# Patient Record
Sex: Female | Born: 1940 | Race: White | Hispanic: No | Smoking: Never smoker
Health system: Southern US, Community
[De-identification: ages and names within clinical notes are randomized; demographics above are authoritative.]

## PROBLEM LIST (undated history)

## (undated) DIAGNOSIS — N183 Chronic kidney disease, stage 3 unspecified: Secondary | ICD-10-CM

## (undated) DIAGNOSIS — R319 Hematuria, unspecified: Secondary | ICD-10-CM

## (undated) DIAGNOSIS — D649 Anemia, unspecified: Secondary | ICD-10-CM

## (undated) DIAGNOSIS — E7800 Pure hypercholesterolemia, unspecified: Secondary | ICD-10-CM

## (undated) DIAGNOSIS — N200 Calculus of kidney: Secondary | ICD-10-CM

## (undated) DIAGNOSIS — I10 Essential (primary) hypertension: Secondary | ICD-10-CM

## (undated) DIAGNOSIS — I4891 Unspecified atrial fibrillation: Secondary | ICD-10-CM

## (undated) DIAGNOSIS — F419 Anxiety disorder, unspecified: Secondary | ICD-10-CM

## (undated) DIAGNOSIS — E039 Hypothyroidism, unspecified: Secondary | ICD-10-CM

## (undated) DIAGNOSIS — Z9289 Personal history of other medical treatment: Secondary | ICD-10-CM

## (undated) HISTORY — PX: FINGER SURGERY: SHX640

## (undated) HISTORY — DX: Calculus of kidney: N20.0

## (undated) HISTORY — PX: CYSTOSCOPY W/ STONE MANIPULATION: SHX1427

## (undated) HISTORY — PX: LAPAROSCOPIC CHOLECYSTECTOMY: SUR755

## (undated) HISTORY — PX: ABDOMINAL HYSTERECTOMY: SHX81

## (undated) HISTORY — PX: APPENDECTOMY: SHX54

## (undated) HISTORY — DX: Essential (primary) hypertension: I10

## (undated) HISTORY — PX: FRACTURE SURGERY: SHX138

## (undated) HISTORY — PX: TONSILLECTOMY: SUR1361

## (undated) HISTORY — PX: ANKLE FRACTURE SURGERY: SHX122

---

## 2010-09-06 HISTORY — PX: CATARACT EXTRACTION W/ INTRAOCULAR LENS  IMPLANT, BILATERAL: SHX1307

## 2010-09-16 ENCOUNTER — Encounter: Payer: Self-pay | Admitting: Family Medicine

## 2010-09-16 ENCOUNTER — Ambulatory Visit (INDEPENDENT_AMBULATORY_CARE_PROVIDER_SITE_OTHER): Payer: Medicare Other | Admitting: Family Medicine

## 2010-09-16 DIAGNOSIS — IMO0001 Reserved for inherently not codable concepts without codable children: Secondary | ICD-10-CM

## 2010-09-16 DIAGNOSIS — R030 Elevated blood-pressure reading, without diagnosis of hypertension: Secondary | ICD-10-CM

## 2010-09-16 DIAGNOSIS — L659 Nonscarring hair loss, unspecified: Secondary | ICD-10-CM

## 2010-09-16 NOTE — Progress Notes (Signed)
  Subjective:    Patient ID: Rachel Keith, female    DOB: [birth year], 70 y.o.   MRN: [id number]  HPI Losing hair on the right side of her head. When she was 39 years she had a complete hysterectomy for heavy bleeding. Has seen dermatology several time over her lifetime for hair loss. She lost her hair with each pregnancy. She came off her estrogen in 2003.   At that time she was treated with monthly 20 mg estrogen shots. A little over a year ago she started to notice that her hair was thinning again. She went to her PCP and asked to go back on her HRT.  No family hx of breast cancer. She gets her mammogram yearly. She restarted her estrogen(that it was the pill form) and synthroid about a year ago.  2 weeks ago noticed a strip of thinning hair.   Then went to endocrinology and they didn't help.  She overall feels there's a big difference between estrogen injections and the pill form. She feels like the hair loss is most likely because she's not on the injections. Her last thyroid check was approximately 4 weeks ago and it was normal. Her dose was not adjusted.  Both parents had thinning of the hair.   Review of Systems  Constitutional: Negative for fever, diaphoresis and unexpected weight change.  HENT: Negative for hearing loss, rhinorrhea, sneezing, postnasal drip and tinnitus.   Eyes: Negative for visual disturbance.  Respiratory: Negative for cough and wheezing.   Cardiovascular: Negative for chest pain and palpitations.  Gastrointestinal: Negative for nausea, vomiting, diarrhea and blood in stool.  Genitourinary: Negative for vaginal bleeding, vaginal discharge and difficulty urinating.  Musculoskeletal: Negative for myalgias and arthralgias.  Skin: Negative for rash.  Neurological: Negative for headaches.  Hematological: Negative for adenopathy. Does not bruise/bleed easily.  Psychiatric/Behavioral: Negative for sleep disturbance and dysphoric mood. The patient is not nervous/anxious.        Objective:   Physical Exam  Constitutional: She appears well-developed and well-nourished.  HENT:  Head: Normocephalic and atraumatic.  Eyes: Conjunctivae and EOM are normal. Pupils are equal, round, and reactive to light.  Neck: Neck supple. No thyromegaly present.  Cardiovascular: Normal rate and regular rhythm.   Murmur heard. Pulmonary/Chest: Effort normal and breath sounds normal.  Lymphadenopathy:    She has no cervical adenopathy.  Skin:       On the right side of her scalp she does have a wide strip of hair loss. 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 She does have slightly thinning hair all over but her hair is also very ever processed.          Assessment & Plan:  PA her thyroid ultrasound and mammogram from [facility] imaging as well as her records from her last PCP.

## 2010-09-17 ENCOUNTER — Encounter: Payer: Self-pay | Admitting: Family Medicine

## 2010-09-17 DIAGNOSIS — L659 Nonscarring hair loss, unspecified: Secondary | ICD-10-CM | POA: Insufficient documentation

## 2010-09-17 DIAGNOSIS — I10 Essential (primary) hypertension: Secondary | ICD-10-CM | POA: Insufficient documentation

## 2010-09-17 NOTE — Assessment & Plan Note (Signed)
Her blood pressure is definitely elevated today. She is now taking blood pressure medications. I would like to recheck this in one month to confirm. Also like to get her old records to see if she's had 10 of hypotension in the past.

## 2010-09-17 NOTE — Assessment & Plan Note (Signed)
Unclear etiology at this point. With a strange about this particular hair loss is noted in one section on the right side of her head. It is very asymmetrical. 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 We discussed different options including seeing a hair specialist, Dr. Linton Rump [facility]. There is approximately a nine-month wait list to get in with her. Another option is to consider changing hormones. We could consider biochemicals and she is pretty set on continuing her hormone therapy. We did discuss the potential risks. I did encourage her to at least get her yearly mammogram which she has already been doing. I also discussed the option of taking a hair loss medication such as Propecia which is primarily indicated for men but patient was not interested in this at this time. She might also benefit from possible trial of Kenalog injections into the area of hair loss, but I will leave this to Dr. Christin Bach expertise.

## 2010-09-18 ENCOUNTER — Telehealth: Payer: Self-pay | Admitting: Family Medicine

## 2010-09-18 NOTE — Telephone Encounter (Signed)
Referral entered  

## 2010-09-18 NOTE — Telephone Encounter (Signed)
Call patient: Just received the notes from the endocrinologist today. I reviewed this. Overall he felt like based on your recent thyroid ultrasound that she has Hashimoto's disease. In regards to the hair loss he felt like that abnormal thyroid hormone levels can cause hair loss and once the thyroid level is back to normal it can take up to 6 months for normal hair growth to recover. Though I know she has been on the thyroid hormone for almost a year. He did not recommend changing her dose as her thyroid hormone levels were normal. He felt any worsening of hair loss was not related to her thyroid gland. At this point in time I still think the best idea is to get a hair specialist involved and proceed after a [facility]. He also does not recommend repeating the ultrasound in the time soon because he felt that the ultrasound is consistent with the Hashimoto's disease and does not require future surveillance.

## 2010-09-18 NOTE — Telephone Encounter (Signed)
Pt notified and voiced understanding.Pt would like to see Dr. Randye Lobo

## 2010-09-25 ENCOUNTER — Telehealth: Payer: Self-pay | Admitting: Family Medicine

## 2010-09-25 NOTE — Telephone Encounter (Signed)
Pt called and has a few questions.   Plan:  Routed the call to triage nurse and pending call back from pt.  Had to [facility]. Jarvis Newcomer, LPN [facility]

## 2010-10-10 ENCOUNTER — Encounter: Payer: Self-pay | Admitting: Family Medicine

## 2010-10-10 ENCOUNTER — Ambulatory Visit (INDEPENDENT_AMBULATORY_CARE_PROVIDER_SITE_OTHER): Payer: Medicare Other | Admitting: Family Medicine

## 2010-10-10 DIAGNOSIS — Z780 Asymptomatic menopausal state: Secondary | ICD-10-CM

## 2010-10-10 DIAGNOSIS — G43109 Migraine with aura, not intractable, without status migrainosus: Secondary | ICD-10-CM | POA: Insufficient documentation

## 2010-10-10 DIAGNOSIS — E785 Hyperlipidemia, unspecified: Secondary | ICD-10-CM | POA: Insufficient documentation

## 2010-10-10 DIAGNOSIS — I10 Essential (primary) hypertension: Secondary | ICD-10-CM

## 2010-10-10 MED ORDER — ATENOLOL 100 MG PO TABS: 100.0000 mg | ORAL_TABLET | Freq: Two times a day (BID) | ORAL | Status: DC

## 2010-10-10 NOTE — Telephone Encounter (Signed)
No response from pt as of 10-10-10, therefore, closed this encounter. Jarvis Newcomer, LPN [facility]

## 2010-10-10 NOTE — Progress Notes (Signed)
  Subjective:    Patient ID: Rachel Keith, female    DOB: May 27, 1940, 70 y.o.   MRN: [id number]  HPI  She is no longer taking her Crestor. No side effects per say but didn't want to take it. I don't have a copy of her labs so i don't know what her numbers look like.  She is complaint with her BP meds and says she did take them this AM. NO SE.    She want to discuss her hormone therapy. She really feels the oral estradiol is not enough and is not working well for her.  She used to use injection 40mg  and says she really felt great on this. She is unhappy with the aging process and wants to age gracefully. She is also currently using REtin-A cream for wrinkles. She doesn't need refills right now.  She also feels that the estrogen is now enough because in the past when the "dose was higher" it really helped with her hair loss.  We are working on an appt with Dr. Darreld Mclean who is a hairloss expert in our area.    Review of Systems     Objective:   Physical Exam  Constitutional: She is oriented to person, place, and time. She appears well-developed and well-nourished.  HENT:  Head: Normocephalic and atraumatic.  Cardiovascular: Normal rate, regular rhythm and normal heart sounds.        No carotid bruits.   Pulmonary/Chest: Effort normal and breath sounds normal.  Neurological: She is alert and oriented to person, place, and time.  Psychiatric: She has a normal mood and affect.          Assessment & Plan:

## 2010-10-10 NOTE — Assessment & Plan Note (Signed)
Discussed that since the is low risk and she is very persistant about wanting hormones I recommend compounded hormones that will have a more consistant deliver. I explain how these work and I recommend saliva testing for hormone levels. Will refer her to [facility] to med with Dr. Keitha Butte. For now can contniue her estradiol.

## 2010-10-10 NOTE — Assessment & Plan Note (Addendum)
Not well controlled. Inc atenolol to bid since the half life on this drug is actually pretty short.  F/u for repeat BP check in 1 month. Hopefully i will have her labs from prior PCP by then.

## 2010-10-10 NOTE — Patient Instructions (Signed)
[facility].  Call [phone]. [address], [address], [place], [place] [address]

## 2010-11-01 ENCOUNTER — Telehealth: Payer: Self-pay | Admitting: Family Medicine

## 2010-11-01 ENCOUNTER — Other Ambulatory Visit: Payer: Self-pay | Admitting: Family Medicine

## 2010-11-01 MED ORDER — AMBULATORY NON FORMULARY MEDICATION: Status: DC

## 2010-11-01 NOTE — Telephone Encounter (Signed)
Rachel Keith called from [facility] and the pt is wanting someone to call her to discuss her labs.  Dr. Linford Arnold ordered for the saliva test to be done. Plan:  Notified Rachel Keith at the pharmacy.  Told to be sure to go ahead and refax the labs again today and put attention to the provider.  Pt has appt coming up and the compound pharm will tell the pt we'll decide on hormones after provider reviews. Rachel Newcomer, LPN [facility]

## 2010-11-18 NOTE — Telephone Encounter (Signed)
Called to the patient. Check she has an appointment on Thursday morning additionally, discussed her results. I do have a copy of the results in my box.

## 2010-11-21 ENCOUNTER — Other Ambulatory Visit: Payer: Self-pay | Admitting: Family Medicine

## 2010-11-21 ENCOUNTER — Ambulatory Visit (INDEPENDENT_AMBULATORY_CARE_PROVIDER_SITE_OTHER): Payer: Medicare Other | Admitting: Family Medicine

## 2010-11-21 ENCOUNTER — Encounter: Payer: Self-pay | Admitting: Family Medicine

## 2010-11-21 VITALS — BP 157/75 | HR 50 | Ht 62.0 in | Wt 130.0 lb

## 2010-11-21 DIAGNOSIS — L659 Nonscarring hair loss, unspecified: Secondary | ICD-10-CM

## 2010-11-21 DIAGNOSIS — F411 Generalized anxiety disorder: Secondary | ICD-10-CM

## 2010-11-21 DIAGNOSIS — I10 Essential (primary) hypertension: Secondary | ICD-10-CM

## 2010-11-21 DIAGNOSIS — F419 Anxiety disorder, unspecified: Secondary | ICD-10-CM

## 2010-11-21 MED ORDER — FLUOXETINE HCL 20 MG PO TABS: ORAL_TABLET | ORAL | Status: DC

## 2010-11-21 NOTE — Progress Notes (Signed)
  Subjective:    Patient ID: Rachel Keith, female    DOB: [birth year], 70 y.o.   MRN: [id number]  HPI She has started the estrogen and progesterone cream and has noticed dec hair loss on her scalp.She has noticed some inc hair growth on her upper lip with her progerstone cream.  She is told that this will mean that she'll get some increased hair growth on her head head. She says she really liked working with [facility]  She is under a lot of stress right now, finances, ex-husband, etc.  her son is currently living with her and he seems to be somewhat angry before and has been difficult to live with. He also owes her a lot of money. She really wants an anxiety medication so that she isnt' using valium too often.  Feels he nerves are shot.   BP is till high.  She did increase her atenolol ot bid and and is toelrating well but says she is just really stressed right now and says that his why her BP is high.  She's been taking her medications regularly.   Review of Systems     Objective:   Physical Exam  Constitutional: She is oriented to person, place, and time. She appears well-developed and well-nourished.  Neurological: She is alert and oriented to person, place, and time.  Skin: Skin is warm and dry.  Psychiatric: She has a normal mood and affect. Her behavior is normal.          Assessment & Plan:

## 2010-11-21 NOTE — Assessment & Plan Note (Signed)
We discussed starting an SSRI. We discussed the potential side effects of the medication and what to expect. I alsoexplained that this is slow low acting. Recommend she followup in 3 weeks. She can still use this with her Valium. Her dad 7 score was 17 today.

## 2010-11-21 NOTE — Assessment & Plan Note (Signed)
Her blood pressure is not well controlled today. Is having increasing her atenolol would make a difference. She can follow back up in 3 weeks for her mood and will recheck at that time. If she is still running high we will adjust her medication again. She really seems to think that this is stress related.

## 2010-11-21 NOTE — Assessment & Plan Note (Signed)
Will start an SSRI and f/u in 3 weeks. Discussed potential SE.

## 2010-11-21 NOTE — Telephone Encounter (Signed)
Pt called and said she was seen this morning, and was suppose to have a script faxed to her pharm and when she went there to get it they didn't have it.  The script was for her prozac, but when looking in the pt chart file it was sent today. Plan:  Called the pt and she said she had gotten the script.\ Jarvis Newcomer, LPN [facility]

## 2010-11-21 NOTE — Assessment & Plan Note (Signed)
Hopefully getting her hormones were balanced will improve her hair loss. We did review the results of her hormone testing in the office today.

## 2010-12-16 ENCOUNTER — Encounter: Payer: Self-pay | Admitting: Family Medicine

## 2010-12-16 ENCOUNTER — Ambulatory Visit (INDEPENDENT_AMBULATORY_CARE_PROVIDER_SITE_OTHER): Payer: Medicare Other | Admitting: Family Medicine

## 2010-12-16 DIAGNOSIS — F419 Anxiety disorder, unspecified: Secondary | ICD-10-CM

## 2010-12-16 DIAGNOSIS — I10 Essential (primary) hypertension: Secondary | ICD-10-CM

## 2010-12-16 DIAGNOSIS — F411 Generalized anxiety disorder: Secondary | ICD-10-CM

## 2010-12-16 MED ORDER — DIAZEPAM 10 MG PO TABS: 10.0000 mg | ORAL_TABLET | Freq: Two times a day (BID) | ORAL | Status: DC | PRN

## 2010-12-16 MED ORDER — CITALOPRAM HYDROBROMIDE 10 MG PO TABS: 10.0000 mg | ORAL_TABLET | Freq: Every day | ORAL | Status: DC

## 2010-12-16 NOTE — Progress Notes (Signed)
  Subjective:    Patient ID: Rachel Keith, female    DOB: [birth year], 70 y.o.   MRN: [id number]  HPI  She is here to followup today for her anxiety. Overall she is still extremely stressed. Her daughter who she has not seen in 3 years is coming into town. She did try the Paxil for several weeks and that it made her extremely tired. She felt very groggy and weak on it. She also felt it did not help her mood. She's been using her Valium when necessary. She said she would like to go back and try the generic again as the brand name is constant her $200. She has tried the generic in the past and felt it did not work as well as the brand. She was wondering if we could try a higher dose generic and see if maybe that works as well as the brand 5 mg dose.  Hypertension-she says she is still taking her medications regularly but she is extremely stressed. She really feels that this is contributing to her high blood pressure.  Review of Systems     Objective:   Physical Exam  Constitutional: She is oriented to person, place, and time. She appears well-developed and well-nourished.  HENT:  Head: Normocephalic and atraumatic.  Cardiovascular: Normal rate, regular rhythm and normal heart sounds.   Pulmonary/Chest: Effort normal and breath sounds normal.  Neurological: She is alert and oriented to person, place, and time.  Skin: Skin is warm and dry.  Psychiatric: She has a normal mood and affect. Her behavior is normal.          Assessment & Plan:

## 2010-12-16 NOTE — Assessment & Plan Note (Addendum)
Her blood pressure is better today than it was at the last visit. She has been taking her medications consistently. However the like and that is HCTZ. Recheck in one month.

## 2010-12-16 NOTE — Assessment & Plan Note (Signed)
Her GAD 7 score is 20 today. Which is still pretty significant. At this point time we will discontinue her Prozac, that she is married he stopped it herself. We will try citalopram and see her back in one month. Her pH q. 9 score is 16 today. I did increase her Valium to 10 mg and wrote for generic. This should only be used when necessary.

## 2010-12-18 ENCOUNTER — Telehealth: Payer: Self-pay | Admitting: *Deleted

## 2010-12-18 NOTE — Telephone Encounter (Signed)
Pt does not want to start a fluid pill

## 2010-12-18 NOTE — Telephone Encounter (Signed)
Message copied by Wyline Beady on Wed Dec 18, 2010 12:22 PM ------      Message from: Nani Gasser D      Created: Mon Dec 16, 2010  5:15 PM       Would you call patient and see if she is okay with starting a low dose fluid pill in addition to her other blood pressure medications as her blood pressure was still high at the office visit yesterday.

## 2011-01-06 ENCOUNTER — Ambulatory Visit (INDEPENDENT_AMBULATORY_CARE_PROVIDER_SITE_OTHER): Payer: Medicare Other | Admitting: Family Medicine

## 2011-01-06 ENCOUNTER — Encounter: Payer: Self-pay | Admitting: Family Medicine

## 2011-01-06 VITALS — BP 151/68 | HR 46 | Wt 130.0 lb

## 2011-01-06 DIAGNOSIS — F411 Generalized anxiety disorder: Secondary | ICD-10-CM

## 2011-01-06 DIAGNOSIS — F419 Anxiety disorder, unspecified: Secondary | ICD-10-CM

## 2011-01-06 DIAGNOSIS — I498 Other specified cardiac arrhythmias: Secondary | ICD-10-CM

## 2011-01-06 DIAGNOSIS — I10 Essential (primary) hypertension: Secondary | ICD-10-CM

## 2011-01-06 DIAGNOSIS — R001 Bradycardia, unspecified: Secondary | ICD-10-CM

## 2011-01-06 MED ORDER — BENAZEPRIL HCL 40 MG PO TABS: 40.0000 mg | ORAL_TABLET | Freq: Every day | ORAL | Status: DC

## 2011-01-06 NOTE — Progress Notes (Signed)
  Subjective:    Patient ID: Rachel Keith, female    DOB: 01/19/1941, 70 y.o.   MRN: [id number]  HPI Says she really wants to add capsules to her Biest cream. I will talk to [facility].She has noticed improvement in her skin and some in her haris  She says she is dong well on the generic valium. She was worried about the generic but it is working well.   Low HR - Feeling more tired easily.   No SOB or chest pain.  Able to do her yard work and house work without any shortness of breath, dizziness. She denies any palpitations. No prior history of heart disease. Her [place] stress [place] and she reports it was normal.   Review of Systems     Objective:   Physical Exam  Constitutional: She is oriented to person, place, and time. She appears well-developed and well-nourished.  HENT:  Head: Normocephalic and atraumatic.  Cardiovascular: Regular rhythm and normal heart sounds.        Bradycardic.   Pulmonary/Chest: Effort normal and breath sounds normal.  Musculoskeletal: She exhibits no edema.  Neurological: She is alert and oriented to person, place, and time.  Skin: Skin is warm and dry.  Psychiatric: She has a normal mood and affect. Her behavior is normal.          Assessment & Plan:  Bradycardia-I would like her to cardiology for further evaluation. Her EKG today showed a rate of 43 beats per minute normal sinus rhythm. She did have inverted T waves in lead 3. This may be benign but I would like to have cardiology evaluate her.  Hypertension-still not well controlled. She admitted she had not been taking the Lotensin and only the atenolol. I let her know that we need to restart the Lotensin and she needs to take both medications. Followup in 6 weeks to recheck blood pressure.  Anxiety-wise she is doing on the generic Valium.  Hair loss/menopausal symptoms-I will comment solutions and talk to them about possibly doing the capsules.

## 2011-01-06 NOTE — Patient Instructions (Signed)
We will call you with the cardiology appointment.

## 2011-01-07 ENCOUNTER — Encounter: Payer: Self-pay | Admitting: Family Medicine

## 2011-01-08 ENCOUNTER — Telehealth: Payer: Self-pay | Admitting: Family Medicine

## 2011-01-08 NOTE — Telephone Encounter (Signed)
Pt calling and inquiring about a medication that she had been using in cream form called biest cream, but said she had discussed with Dr. Linford Arnold about getting this medication in capsule form from [facility].  The dose discussed was either 5 or 10 mg caps.  [facility] # [id number].  Please advise. PLAN:  Routed to Dr. Linford Arnold for review for tomorrow. Jarvis Newcomer, LPN [facility]

## 2011-01-08 NOTE — Telephone Encounter (Signed)
I got a note from Dr. Keitha Butte yesterday and he rec inc dose on the cream instead of pills and I agree with him,  so lets try this first.  Also I ok'd rx for minoxidil which can help with hair growth as well but if she want to hold off on this one can decline the med.

## 2011-01-20 ENCOUNTER — Telehealth: Payer: Self-pay | Admitting: Family Medicine

## 2011-01-20 NOTE — Telephone Encounter (Signed)
Pt called and gave instructions/recommendations from the provider regarding her medications.  Will do crm vs. Pills.  Also sent another med to the pharm.  LMOM for the pt with this information. Jarvis Newcomer, LPN [facility]

## 2011-01-21 ENCOUNTER — Telehealth: Payer: Self-pay | Admitting: Family Medicine

## 2011-01-21 NOTE — Telephone Encounter (Signed)
Pt has talked with [facility] and asked if she needed to take thyroid medication, and Caryn Bee at [facility] suggested that if questioned if pt even needed to be on thyroid medication at this point since level so minimal.   Plan:  Sent message to ask provider if pt needs to do TSH level to check at this point and if pt has to be on thyroid med based on levels would like to go through [facility] for natural pill vs synthetic stuff gotten at pharmacies. Jarvis Newcomer, LPN [facility]

## 2011-01-21 NOTE — Telephone Encounter (Signed)
Pt returning call to the triage nurse.  Wanted to go over the recommendations from [facility] again to make sure she had everything straight. Plan:  Reviewed the recommendations once again with the pt and we will do the cream for now instead of the pills. Jarvis Newcomer, LPN [facility]

## 2011-01-21 NOTE — Telephone Encounter (Signed)
Can stop the thyroid med adn then after one month can recheck her blood level. Only way to find out is to check her level without it.

## 2011-01-21 NOTE — Telephone Encounter (Signed)
Pt informed of the below response from Dr. Linford Arnold.  Had to [facility] for the pt. Marland Kitchen

## 2011-01-22 ENCOUNTER — Encounter: Payer: Self-pay | Admitting: Cardiology

## 2011-01-22 ENCOUNTER — Telehealth: Payer: Self-pay | Admitting: Family Medicine

## 2011-01-22 ENCOUNTER — Ambulatory Visit (INDEPENDENT_AMBULATORY_CARE_PROVIDER_SITE_OTHER): Payer: Medicare Other | Admitting: Cardiology

## 2011-01-22 DIAGNOSIS — E785 Hyperlipidemia, unspecified: Secondary | ICD-10-CM

## 2011-01-22 DIAGNOSIS — R001 Bradycardia, unspecified: Secondary | ICD-10-CM

## 2011-01-22 DIAGNOSIS — I498 Other specified cardiac arrhythmias: Secondary | ICD-10-CM

## 2011-01-22 DIAGNOSIS — R011 Cardiac murmur, unspecified: Secondary | ICD-10-CM | POA: Insufficient documentation

## 2011-01-22 DIAGNOSIS — I10 Essential (primary) hypertension: Secondary | ICD-10-CM

## 2011-01-22 NOTE — Telephone Encounter (Signed)
Pt left message on voice mail for triage nurse.  Wants to come in to have heart rate checked prior to cardiology appt. Plan:  Pt called.  Had to [facility] instructing the pt to call the front office and schedule a nurse visit to do this. Jarvis Newcomer, LPN [facility]

## 2011-01-22 NOTE — Telephone Encounter (Signed)
Pt informed of provider instructions/orders of below. Jarvis Newcomer, LPN [facility]

## 2011-01-22 NOTE — Progress Notes (Signed)
HPI: 70 year old female for evaluation of bradycardia. 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 I do not have those records available. Noted to have a heart rate of 38 on electrocardiogram in August of 2012. This was a sinus bradycardia. She apparently was taking atenolol 100 mg po bid at the time. Her atenolol was reduced to 100 mg daily. At the time of the evaluation she was complaining of mild dizziness and significant fatigue. Those symptoms have now resolved. She has mild dyspnea with more extreme activities but not with routine activities. No orthopnea, PND, pedal edema, chest pain, palpitations or syncope.  Current Outpatient Prescriptions  Medication Sig Dispense Refill  . AMBULATORY NON FORMULARY MEDICATION Medication Name: Biest 5mg /ml Cream. Apply 0.5 mL to inner arms or upper thighs daily  15 mL  6  . AMBULATORY NON FORMULARY MEDICATION Medication Name: progeterone 4% cream. Apply half an mL 2 inner arms or upper thighs daily at bedtime  15 mL  6  . AMBULATORY NON FORMULARY MEDICATION Medication Name: Progesterone 75 mg SR capsules. Take one capsule at bedtime.  30 capsule  6  . AMBULATORY NON FORMULARY MEDICATION Medication Name: DHEA 5 mg per 0.5 mL cream. Apply 0.5 mL behind the knee or ulnar wrist daily.  15 mL  6  . atenolol (TENORMIN) 100 MG tablet Take 100 mg by mouth daily.        . beclomethasone (BECONASE-AQ) 42 MCG/SPRAY nasal spray Place 2 sprays into the nose daily. Dose is for each nostril.       . benazepril (LOTENSIN) 40 MG tablet Take 1 tablet (40 mg total) by mouth daily.  30 tablet  3  . butalbital-aspirin-caffeine (FIORINAL) 50-325-40 MG per capsule Take 1 capsule by mouth every 4 (four) hours as needed. PRN       . diazepam (VALIUM) 10 MG tablet Take 1 tablet (10 mg total) by mouth every 12 (twelve) hours as needed. PRN  30 tablet  0  . tretinoin (RETIN-A) 0.025 % cream Apply 1 application topically at bedtime.          No Known  Allergies  Past Medical History  Diagnosis Date  . Hypertension     Past Surgical History  Procedure Date  . Cataract extraction 09/06/2010  . Abdominal hysterectomy   . Appendectomy   . Tonsillectomy   . Cholecystectomy   . Left ankle surgery   . Finger surgery     History   Social History  . Marital Status: Unknown    Spouse Name: N/A    Number of Children: 4  . Years of Education: N/A   Occupational History  . retired    Social History Main Topics  . Smoking status: Never Smoker   . Smokeless tobacco: Not on file  . Alcohol Use: 0.0 oz/week    0 drink(s) per week     occasional  . Drug Use: No  . Sexually Active: No   Other Topics Concern  . Not on file   Social History Narrative   1 caffeinated drink daily     No family history on file.  ROS: no fevers or chills, productive cough, hemoptysis, dysphasia, odynophagia, melena, hematochezia, dysuria, hematuria, rash, seizure activity, orthopnea, PND, pedal edema, claudication. Remaining systems are negative.  Physical Exam: General:  Well developed/well nourished in NAD Skin warm/dry Patient not depressed No peripheral clubbing Back-normal HEENT-normal/normal eyelids Neck supple/normal carotid upstroke bilaterally; no bruits; no JVD; no thyromegaly  chest - CTA/ normal expansion CV - Regular, bradycaric/normal S1 and S2; no  rubs or gallops;  PMI nondisplaced; 2/6 systolic murmur at the apex. Abdomen -NT/ND, no HSM, no mass, + bowel sounds, no bruit 2+ femoral pulses, no bruits Ext-no edema, chords, 2+ DP Neuro-grossly nonfocal  ECG Marked Sinus bradycardia with HR 39, Cannot R/O anterior MI, LAD, nonspecific ST changes

## 2011-01-22 NOTE — Patient Instructions (Addendum)
DECREASE ATENOLOL 100 MG TAKE 1/2 TABLET ONCE DAILY  Your physician recommends that you schedule a follow-up appointment in:  8 WEEKS

## 2011-01-22 NOTE — Assessment & Plan Note (Signed)
Management per primary care. 

## 2011-01-22 NOTE — Assessment & Plan Note (Addendum)
Patient's heart rate was markedly reduced at time of previous evaluation with associated dizziness and fatigue. At the time she was on atenolol 100 mg p.o. B.i.d. The dose was reduced to 100 mg daily and she now feels much better with no fatigue or dizziness. However her heart rate remains decreased. Her bradycardia is related to her beta blocker. I will decrease her atenolol to 50 mg daily and this may need to be weaned to off if her heart rate does not improve. She will follow her blood pressure as it may increase on lower doses of atenolol and additional medications may be required. If she requires additional medications or advancement of doses of antihypertensives in the future I would not increase her beta blocker or add further AV nodal blocking agents.

## 2011-01-22 NOTE — Assessment & Plan Note (Signed)
Patient with soft systolic murmur on examination. 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 No records available. I recommended an echocardiogram but she declined for financial reasons. I asked her to call if she would be agreeable in the future.

## 2011-01-22 NOTE — Assessment & Plan Note (Addendum)
Blood pressure mildly elevated. This can be followed and additional medications added as needed. Avoid AV nodal blocking agents given bradycardia with higher doses of atenolol. A diuretic or Norvasc could be considered in the future if needed. Will need to wean atenolol completely his heart rate does not improve on lower dose of atenolol.

## 2011-02-05 ENCOUNTER — Other Ambulatory Visit: Payer: Self-pay | Admitting: Family Medicine

## 2011-02-06 ENCOUNTER — Other Ambulatory Visit: Payer: Self-pay | Admitting: Family Medicine

## 2011-02-06 NOTE — Telephone Encounter (Signed)
Pt called and wanted refill of the diazepam.  Pt states she is not taking all the other medicines recommended. Plan:  Pt informed that the diazepam was sent. Jarvis Newcomer, LPN [facility]

## 2011-02-07 ENCOUNTER — Telehealth: Payer: Self-pay | Admitting: Family Medicine

## 2011-02-10 NOTE — Telephone Encounter (Signed)
Closed

## 2011-02-19 ENCOUNTER — Telehealth: Payer: Self-pay | Admitting: Family Medicine

## 2011-02-19 MED ORDER — AMBULATORY NON FORMULARY MEDICATION: Status: DC

## 2011-02-19 NOTE — Telephone Encounter (Signed)
OK to send new dose

## 2011-02-19 NOTE — Telephone Encounter (Signed)
Pt called for refills of her biest cream she gets from [facility].  Looks like pt did get refills but it could have gone to her mail order, and she prefers to go through the compounding pharmacy since they are cheaper than even the [facility]. Plan:  Script printed and for signature and will fax to [facility]. Jarvis Newcomer, LPN [facility]

## 2011-02-19 NOTE — Telephone Encounter (Signed)
Pt was increased to the 0.03% cream. Routed to Dr. Marlyne Beards, LPN [facility]

## 2011-02-19 NOTE — Telephone Encounter (Signed)
We already sent a prescription for the 0.025% cream. If she was increased to 0.03% cream that is fine.

## 2011-02-19 NOTE — Telephone Encounter (Signed)
Pt stopped by the office and dropped off a request for pt to replace the retin A cream 5% per [facility].They have suggested the following: Tretinoin 0.03% cream.  Please advise. Plan:  Routed request to Dr. Marlyne Beards, LPN [facility]

## 2011-02-20 ENCOUNTER — Telehealth: Payer: Self-pay | Admitting: Family Medicine

## 2011-02-20 MED ORDER — AMBULATORY NON FORMULARY MEDICATION: Status: DC

## 2011-02-20 NOTE — Telephone Encounter (Signed)
Pt came in on 02-19-11 and gave a order form for our office to send in for her tretinoin cream 0.03%.  This is to replace the retin A cream.  Dr Linford Arnold is okay with changing and adjusting dose on this medication.  Also pt said Hounan had discussed with Dr. Linford Arnold for pt to increase the biest cream up to 1 ml under arm or thigh daily.  Plan:  Called [facility] for clarification of the biest cream increase per Dr Linford Arnold order.            Okay to send the tretinoin 0.03% cream. Jarvis Newcomer, LPN [facility]

## 2011-02-20 NOTE — Telephone Encounter (Signed)
New dose faxed as new script today to [facility].  Pt was informed would be sent today. Jarvis Newcomer, LPN [facility]

## 2011-03-03 ENCOUNTER — Encounter: Payer: Self-pay | Admitting: Family Medicine

## 2011-03-03 ENCOUNTER — Ambulatory Visit (INDEPENDENT_AMBULATORY_CARE_PROVIDER_SITE_OTHER): Payer: Medicare Other | Admitting: Family Medicine

## 2011-03-03 VITALS — BP 173/75 | HR 92 | Wt 131.0 lb

## 2011-03-03 DIAGNOSIS — Z79890 Hormone replacement therapy (postmenopausal): Secondary | ICD-10-CM

## 2011-03-03 DIAGNOSIS — I10 Essential (primary) hypertension: Secondary | ICD-10-CM

## 2011-03-03 MED ORDER — BENAZEPRIL HCL 40 MG PO TABS: 40.0000 mg | ORAL_TABLET | Freq: Every day | ORAL | Status: DC

## 2011-03-03 MED ORDER — ATENOLOL 50 MG PO TABS: 50.0000 mg | ORAL_TABLET | Freq: Two times a day (BID) | ORAL | Status: DC

## 2011-03-03 NOTE — Patient Instructions (Addendum)
Lets increase your atenolol to twice a day.  New prescripiton was sent.

## 2011-03-03 NOTE — Progress Notes (Signed)
  Subjective:    Patient ID: Rachel Keith, female    DOB: 02-24-1941, 70 y.o.   MRN: [id number]  HPI HTN - not well controlled. Doesn't check BP at home. Occ episode of lightheadedness but rare. Recenlty dropped atenolol to 1/2 tab for bradycardia. Still on her ACE and tolerating well. Wants rx for 90day supply for cost reasons. No CP or SOB.  Admist she has been under a lot of str aess lately.   HRT-doing really well. "Hasn't felt this good in years".    Review of Systems     Objective:   Physical Exam  Constitutional: She is oriented to person, place, and time. She appears well-developed and well-nourished.  HENT:  Head: Normocephalic and atraumatic.  Eyes: Pupils are equal, round, and reactive to light.  Neck: Neck supple. No thyromegaly present.  Cardiovascular: Normal rate and regular rhythm.   Pulmonary/Chest: Effort normal and breath sounds normal.  Lymphadenopathy:    She has no cervical adenopathy.  Neurological: She is alert and oriented to person, place, and time.  Skin: Skin is warm and dry.  Psychiatric: She has a normal mood and affect. Her behavior is normal.          Assessment & Plan:  HTN - try increasing atenolol  to half a tablet twice a day. She is having trouble to split the pills so I will change her to 50 mg twice a day. We'll need to monitor for bradycardia. If it does we can consider something like HCTZ or diuretic. Repeat blood pressure was slightly better.  HRT-doing well. Stable on current regimen. She is happy with the improvement in her hair loss.

## 2011-03-26 ENCOUNTER — Ambulatory Visit: Payer: Medicare Other | Admitting: Cardiology

## 2011-04-21 ENCOUNTER — Encounter: Payer: Self-pay | Admitting: Family Medicine

## 2011-04-21 ENCOUNTER — Ambulatory Visit (INDEPENDENT_AMBULATORY_CARE_PROVIDER_SITE_OTHER): Payer: Medicare Other | Admitting: Family Medicine

## 2011-04-21 VITALS — BP 177/72 | HR 58 | Wt 134.0 lb

## 2011-04-21 DIAGNOSIS — F411 Generalized anxiety disorder: Secondary | ICD-10-CM

## 2011-04-21 DIAGNOSIS — F419 Anxiety disorder, unspecified: Secondary | ICD-10-CM

## 2011-04-21 DIAGNOSIS — I10 Essential (primary) hypertension: Secondary | ICD-10-CM

## 2011-04-21 MED ORDER — DIAZEPAM 10 MG PO TABS: 10.0000 mg | ORAL_TABLET | Freq: Every day | ORAL | Status: DC | PRN

## 2011-04-21 NOTE — Progress Notes (Signed)
  Subjective:    Patient ID: Rachel Keith, female    DOB: 1940/06/02, 70 y.o.   MRN: [id number]  HPI Here for HTN fu. She says she feels well but BP has been running higher. She feels this is from a recent in  her stress levels at home.  She has been taking her medication regularly. Thought only taking 50mg  of her atenolol lately. No CP or SOB.    Review of Systems     Objective:   Physical Exam  Constitutional: She is oriented to person, place, and time. She appears well-developed and well-nourished.  HENT:  Head: Normocephalic and atraumatic.  Cardiovascular: Normal rate, regular rhythm and normal heart sounds.   Pulmonary/Chest: Effort normal and breath sounds normal.  Neurological: She is alert and oriented to person, place, and time.  Skin: Skin is warm and dry.  Psychiatric: She has a normal mood and affect. Her behavior is normal.          Assessment & Plan:  HTN- Not well controlled. Will inc atenolol to 50mg  bid and recheck in one month. Still dealing with alo of stress.   Anxiety - She would like her valium sent for mail order. She still has several tabs let of her orginial rx.

## 2011-04-30 ENCOUNTER — Telehealth: Payer: Self-pay | Admitting: *Deleted

## 2011-04-30 MED ORDER — AMBULATORY NON FORMULARY MEDICATION: Status: DC

## 2011-04-30 NOTE — Telephone Encounter (Signed)
Printed adn will place in you box.

## 2011-04-30 NOTE — Telephone Encounter (Signed)
Pt calls and would like a refill on her Progesterone 75mg  capsules sent to [facility] in [place]. Would like a 90 day supply.

## 2011-05-26 ENCOUNTER — Ambulatory Visit (INDEPENDENT_AMBULATORY_CARE_PROVIDER_SITE_OTHER): Payer: Medicare Other | Admitting: Family Medicine

## 2011-05-26 ENCOUNTER — Encounter: Payer: Self-pay | Admitting: Family Medicine

## 2011-05-26 VITALS — BP 162/70 | HR 52 | Temp 97.9°F | Ht 61.0 in | Wt 133.0 lb

## 2011-05-26 DIAGNOSIS — I10 Essential (primary) hypertension: Secondary | ICD-10-CM

## 2011-05-26 LAB — LIPID PANEL
Cholesterol: 163 mg/dL (ref 0–200)
Total CHOL/HDL Ratio: 3.5 Ratio
Triglycerides: 136 mg/dL (ref <150)

## 2011-05-26 LAB — COMPLETE METABOLIC PANEL WITH GFR
Albumin: 4.2 g/dL (ref 3.5–5.2)
Alkaline Phosphatase: 64 U/L (ref 39–117)
BUN: 20 mg/dL (ref 6–23)
Creat: 1.16 mg/dL — ABNORMAL HIGH (ref 0.50–1.10)
GFR, Est Non African American: 47 mL/min — ABNORMAL LOW
Glucose, Bld: 90 mg/dL (ref 70–99)
Potassium: 4.4 mEq/L (ref 3.5–5.3)

## 2011-05-26 MED ORDER — CHLORTHALIDONE 25 MG PO TABS: 25.0000 mg | ORAL_TABLET | Freq: Every day | ORAL | Status: DC

## 2011-05-26 NOTE — Patient Instructions (Signed)

## 2011-05-26 NOTE — Progress Notes (Signed)
  Subjective:    Patient ID: Rachel Keith, female    DOB: 05/14/1940, 71 y.o.   MRN: [id number]  Hypertension This is a chronic problem. The current episode started more than 1 year ago. The problem is controlled. Associated symptoms include anxiety. Pertinent negatives include no chest pain or shortness of breath. There are no associated agents to hypertension. Risk factors for coronary artery disease include no known risk factors. Past treatments include ACE inhibitors and beta blockers. The current treatment provides moderate improvement. There are no compliance problems.   Anxiety Presents for follow-up visit. Patient reports no chest pain, insomnia, irritability or shortness of breath. Symptoms occur rarely. The quality of sleep is good. Nighttime awakenings: several (for urination).     HRT- She feels her , her hormones are gray. She's noticed a major difference and hair growth and her skin. She is very happy with the results. She denies any side effects of the medication.   Review of Systems  Constitutional: Negative for irritability.  Respiratory: Negative for shortness of breath.   Cardiovascular: Negative for chest pain.  Psychiatric/Behavioral: The patient does not have insomnia.        Objective:   Physical Exam  Constitutional: She is oriented to person, place, and time. She appears well-developed and well-nourished.  HENT:  Head: Normocephalic and atraumatic.  Cardiovascular: Normal rate, regular rhythm and normal heart sounds.   Pulmonary/Chest: Effort normal and breath sounds normal.  Neurological: She is alert and oriented to person, place, and time.  Skin: Skin is warm and dry.  Psychiatric: She has a normal mood and affect. Her behavior is normal.          Assessment & Plan:  HTN- not well controlled. Add chlorthalidone and f/u in 1 month.  Make sure eating low salt diet and working on regular exercise.  Given H.O.   Anxiety - GAD-7 score of 0.  Well  controlled. F/U in 6 months.  Call if any concerns.   She declined all vaccines today.

## 2011-05-27 ENCOUNTER — Other Ambulatory Visit: Payer: Self-pay | Admitting: *Deleted

## 2011-05-27 MED ORDER — AMBULATORY NON FORMULARY MEDICATION: Status: DC

## 2011-06-23 ENCOUNTER — Encounter: Payer: Self-pay | Admitting: Family Medicine

## 2011-06-23 ENCOUNTER — Ambulatory Visit (INDEPENDENT_AMBULATORY_CARE_PROVIDER_SITE_OTHER): Payer: Medicare Other | Admitting: Family Medicine

## 2011-06-23 VITALS — BP 137/67 | HR 67 | Wt 135.0 lb

## 2011-06-23 DIAGNOSIS — N951 Menopausal and female climacteric states: Secondary | ICD-10-CM

## 2011-06-23 DIAGNOSIS — I10 Essential (primary) hypertension: Secondary | ICD-10-CM

## 2011-06-23 MED ORDER — ATENOLOL 50 MG PO TABS: 50.0000 mg | ORAL_TABLET | Freq: Two times a day (BID) | ORAL | Status: DC

## 2011-06-23 NOTE — Progress Notes (Signed)
  Subjective:    Patient ID: Rachel Keith, female    DOB: 06/06/1940, 71 y.o.   MRN: [id number]  HPI Started the chlorthalidone but says urinated too much. Taking the atenolol and benazepril.  She stopped it but has take it a couple of times.  Also taking glucosamine MSM.  Has been taking garlic twice a day.  Also taking Vitamin D.     Review of Systems     Objective:   Physical Exam  Constitutional: She is oriented to person, place, and time. She appears well-developed and well-nourished.  HENT:  Head: Normocephalic and atraumatic.  Cardiovascular: Normal rate, regular rhythm and normal heart sounds.   Pulmonary/Chest: Effort normal and breath sounds normal.  Neurological: She is alert and oriented to person, place, and time.  Skin: Skin is warm and dry.  Psychiatric: She has a normal mood and affect. Her behavior is normal.          Assessment & Plan:  HTN - This is the best BP since she has been here.  This is great!  Finally at goal. She is not taking the chlorthalidone so will remove from med list. Continue currnt regimen. F/U in 4 monts.   [place]ue for bone density.  Will place order today.

## 2011-06-25 ENCOUNTER — Ambulatory Visit: Payer: Medicare Other | Admitting: Family Medicine

## 2011-07-09 ENCOUNTER — Other Ambulatory Visit: Payer: Self-pay | Admitting: *Deleted

## 2011-07-09 MED ORDER — BUTALBITAL-ASA-CAFFEINE 50-325-40 MG PO CAPS: 1.0000 | ORAL_CAPSULE | ORAL | Status: DC | PRN

## 2011-07-09 NOTE — Telephone Encounter (Signed)
Pt notified. KJ LPN 

## 2011-07-09 NOTE — Telephone Encounter (Signed)
Pt calls and would like a refill on her fiornal caps that she uses when has a bad migraine. Last refill was 01/2011. I dont see on med list. Please advise

## 2011-07-09 NOTE — Telephone Encounter (Signed)
OK, to fill. Have her send old rx request from pharm so we can verify dose, quant, etc.  I think it comes in different combos.

## 2011-07-18 ENCOUNTER — Other Ambulatory Visit: Payer: Self-pay | Admitting: *Deleted

## 2011-07-18 MED ORDER — FIORINAL 50-325-40 MG PO CAPS: 1.0000 | ORAL_CAPSULE | ORAL | Status: AC | PRN

## 2011-07-18 MED ORDER — BUTALBITAL-ASPIRIN-CAFFEINE 50-325-40 MG PO CAPS: 1.0000 | ORAL_CAPSULE | ORAL | Status: DC | PRN

## 2011-07-24 ENCOUNTER — Telehealth: Payer: Self-pay | Admitting: Family Medicine

## 2011-07-25 NOTE — Telephone Encounter (Signed)
Open by mistake

## 2011-08-30 ENCOUNTER — Other Ambulatory Visit: Payer: Self-pay | Admitting: Family Medicine

## 2011-10-22 ENCOUNTER — Ambulatory Visit (INDEPENDENT_AMBULATORY_CARE_PROVIDER_SITE_OTHER): Payer: Medicare Other | Admitting: Family Medicine

## 2011-10-22 ENCOUNTER — Encounter: Payer: Self-pay | Admitting: Family Medicine

## 2011-10-22 VITALS — BP 153/63 | HR 76 | Ht 61.0 in | Wt 135.0 lb

## 2011-10-22 DIAGNOSIS — F411 Generalized anxiety disorder: Secondary | ICD-10-CM

## 2011-10-22 DIAGNOSIS — I10 Essential (primary) hypertension: Secondary | ICD-10-CM

## 2011-10-22 DIAGNOSIS — F419 Anxiety disorder, unspecified: Secondary | ICD-10-CM

## 2011-10-22 MED ORDER — DIAZEPAM 10 MG PO TABS: 10.0000 mg | ORAL_TABLET | Freq: Every day | ORAL | Status: DC | PRN

## 2011-10-22 NOTE — Progress Notes (Signed)
  Subjective:    Patient ID: Rachel Keith, female    DOB: December 24, 1940, 71 y.o.   MRN: [id number]  HPI HTN - No CP or SOB. Taking her meds.  Did have cafffine this am. No NSAIDs or decongestants.    Has had some stress in her life. Recently met with her ex-husband about money issues. This actually turned out to be less stressful than she thought it would be. They have been separated since 2001. Her youngest son does currently live with her and has been very supportive of her.   Review of Systems     Objective:   Physical Exam  Constitutional: She is oriented to person, place, and time. She appears well-developed and well-nourished.  HENT:  Head: Normocephalic and atraumatic.  Cardiovascular: Normal rate, regular rhythm and normal heart sounds.        2/6 SEM   Pulmonary/Chest: Effort normal and breath sounds normal.  Neurological: She is alert and oriented to person, place, and time.  Skin: Skin is warm and dry.  Psychiatric: She has a normal mood and affect. Her behavior is normal.          Assessment & Plan:  HTN - Not well controlled today. BP was better at last OV.  Did have cup of coffee this AM.  Recheck in 1-2 weeks for BP with nurse visit.  Check BMP today. F/U in 6 months if BP well controlled at f/u OV.    Anxiety-she's actually doing well right now. She also has some support in her life which is fantastic.  Fall assessment- score of 2, low risk for falls.  Depression screen- PHQ9 score of 0, negative for depression.

## 2011-10-22 NOTE — Patient Instructions (Addendum)
Return for nurse BP check in 1-2 weeks. We can adjust your atenolol if pressure is still elevated.

## 2011-10-24 ENCOUNTER — Telehealth: Payer: Self-pay | Admitting: *Deleted

## 2011-10-24 NOTE — Telephone Encounter (Signed)
Received call from [facility] stating pt wanted Brand Valium and not Diazepam. Pt initiated Prior auth and it was approved for name brand.

## 2011-10-27 ENCOUNTER — Other Ambulatory Visit: Payer: Self-pay | Admitting: *Deleted

## 2011-10-27 MED ORDER — BUTALBITAL-ASA-CAFFEINE 50-325-40 MG PO CAPS: 1.0000 | ORAL_CAPSULE | ORAL | Status: DC | PRN

## 2011-10-27 MED ORDER — DIAZEPAM 10 MG PO TABS: 10.0000 mg | ORAL_TABLET | Freq: Every day | ORAL | Status: DC | PRN

## 2011-11-05 ENCOUNTER — Ambulatory Visit (INDEPENDENT_AMBULATORY_CARE_PROVIDER_SITE_OTHER): Payer: Medicare Other | Admitting: Physician Assistant

## 2011-11-05 VITALS — BP 146/71 | HR 83

## 2011-11-05 DIAGNOSIS — I10 Essential (primary) hypertension: Secondary | ICD-10-CM

## 2011-11-05 MED ORDER — HYDROCHLOROTHIAZIDE 12.5 MG PO CAPS: 12.5000 mg | ORAL_CAPSULE | Freq: Every day | ORAL | Status: DC

## 2011-11-05 NOTE — Progress Notes (Signed)
Pt is asking is there something else she can take other than a water pill b/c it makes her go so much that she goes and has to turn right back around and go again. Says she wants to know if there is something else she can do. States she has tried the water pill before.

## 2011-11-05 NOTE — Progress Notes (Signed)
  Subjective:    Patient ID: Rachel Keith, female    DOB: 12-09-1940, 72 y.o.   MRN: [id number]  HPI   Pt denies chest pain, SOB, dizziness, or heart palpitations.  Taking meds as directed w/o problems.  Denies medication side effects.  5 min spent with pt.   Review of Systems     Objective:   Physical Exam        Assessment & Plan:

## 2011-11-05 NOTE — Progress Notes (Signed)
Pt states if you can give her the lowest dose of the HCTZ then she will try it. Please let me know if this is the lowest dose there is.

## 2011-11-05 NOTE — Patient Instructions (Signed)
Add HCTZ once daily to bp medications. Recheck with nurse visit in one month.

## 2011-11-05 NOTE — Progress Notes (Signed)
  Subjective:    Patient ID: Rachel Keith, female    DOB: 1941-04-27, 71 y.o.   MRN: [id number]  HPI    Review of Systems     Objective:   Physical Exam        Assessment & Plan:  Added HCTZ 12.5 daily to bp medications. Will recheck in 1 month with nurse visit. BP still high and we need to get down. Tandy Gaw PA-C

## 2011-12-24 ENCOUNTER — Ambulatory Visit (INDEPENDENT_AMBULATORY_CARE_PROVIDER_SITE_OTHER): Payer: Medicare Other | Admitting: Family Medicine

## 2011-12-24 ENCOUNTER — Encounter: Payer: Self-pay | Admitting: Family Medicine

## 2011-12-24 VITALS — BP 155/64 | HR 104 | Ht 62.0 in | Wt 135.0 lb

## 2011-12-24 DIAGNOSIS — I10 Essential (primary) hypertension: Secondary | ICD-10-CM

## 2011-12-24 DIAGNOSIS — L659 Nonscarring hair loss, unspecified: Secondary | ICD-10-CM

## 2011-12-24 MED ORDER — ATENOLOL 50 MG PO TABS: 100.0000 mg | ORAL_TABLET | Freq: Two times a day (BID) | ORAL | Status: DC

## 2011-12-24 NOTE — Progress Notes (Signed)
  Subjective:    Patient ID: Rachel Keith, female    DOB: Mar 19, 1941, 71 y.o.   MRN: [id number]  HPI Has had burning in her right leg for about 8 months. Then noticed a lump on her foot. Saw a foot and ankle specilist.  Did xrays and told it was arthritis.  Given a rx gel to apply and that has helped.  Before that had used ASA 325 daily.  Started getting hairloss again in the last month. Says on the sides of her head as well.  She is worried about losing hair to the point of weraing a wig.  Talked to pharmacist at [facility] who rec increasing her progresterone.     Has been having more frequent hiccups.  Says occ her pills stick in her throat. Doesn't happen with foods.   HTN- Doing well. No CP or SOB. Feels well. Not taking the hctz that was started in June. Says it made her stomach hurt and had to use the bathroom too much.     Review of Systems     Objective:   Physical Exam  Constitutional: She is oriented to person, place, and time. She appears well-developed and well-nourished.  HENT:  Head: Normocephalic and atraumatic.  Neck: Neck supple. No thyromegaly present.  Cardiovascular: Normal rate, regular rhythm and normal heart sounds.   Pulmonary/Chest: Effort normal and breath sounds normal.  Lymphadenopathy:    She has no cervical adenopathy.  Neurological: She is alert and oriented to person, place, and time.  Skin: Skin is warm and dry.  Psychiatric: She has a normal mood and affect. Her behavior is normal.          Assessment & Plan:  hairloss - Can consider increasing her progesterone but will check Thyroid, etc to rule out other causes. If norml then consider adjusting HRT.    HTN- uncontrolled. Increase atenolol to 100mg  bid.  F/U in 1 months to recheck BP

## 2011-12-25 LAB — BASIC METABOLIC PANEL WITH GFR
BUN: 17 mg/dL (ref 6–23)
Calcium: 9.3 mg/dL (ref 8.4–10.5)
GFR, Est African American: 86 mL/min
GFR, Est Non African American: 75 mL/min
Glucose, Bld: 70 mg/dL (ref 70–99)
Potassium: 4.1 mEq/L (ref 3.5–5.3)
Sodium: 143 mEq/L (ref 135–145)

## 2011-12-25 LAB — T3, FREE: T3, Free: 2.9 pg/mL (ref 2.3–4.2)

## 2011-12-25 LAB — VITAMIN D 25 HYDROXY (VIT D DEFICIENCY, FRACTURES): Vit D, 25-Hydroxy: 67 ng/mL (ref 30–89)

## 2011-12-25 LAB — VITAMIN B12: Vitamin B-12: 1019 pg/mL — ABNORMAL HIGH (ref 211–911)

## 2012-01-02 ENCOUNTER — Telehealth: Payer: Self-pay | Admitting: *Deleted

## 2012-01-02 NOTE — Telephone Encounter (Signed)
Pt called and wants to know if you can increase her dose in her progesteron medication to help control her hair loss

## 2012-01-02 NOTE — Telephone Encounter (Signed)
Ok, will inc her cream to 6%.

## 2012-01-06 MED ORDER — AMBULATORY NON FORMULARY MEDICATION: Status: DC

## 2012-01-06 NOTE — Telephone Encounter (Signed)
Pt was notified and rx needs [facility] to [facility]

## 2012-01-13 ENCOUNTER — Telehealth: Payer: Self-pay | Admitting: Family Medicine

## 2012-01-13 MED ORDER — ATENOLOL 100 MG PO TABS: 100.0000 mg | ORAL_TABLET | Freq: Two times a day (BID) | ORAL | Status: DC

## 2012-01-13 NOTE — Telephone Encounter (Signed)
Pt called and states Dr. Linford Arnold was supposed to increase her atenolol to 100 bid. Pt would like rx sent to [facility] first before we send it to mail order pharm. This is correct per last ov note. Sent new rx.acm

## 2012-02-13 ENCOUNTER — Other Ambulatory Visit: Payer: Self-pay | Admitting: *Deleted

## 2012-02-13 MED ORDER — BENAZEPRIL HCL 40 MG PO TABS: 40.0000 mg | ORAL_TABLET | Freq: Every day | ORAL | Status: DC

## 2012-02-18 ENCOUNTER — Ambulatory Visit (INDEPENDENT_AMBULATORY_CARE_PROVIDER_SITE_OTHER): Payer: Medicare Other | Admitting: Family Medicine

## 2012-02-18 ENCOUNTER — Encounter: Payer: Self-pay | Admitting: Family Medicine

## 2012-02-18 VITALS — BP 148/88 | HR 46 | Ht 61.0 in | Wt 133.0 lb

## 2012-02-18 DIAGNOSIS — R030 Elevated blood-pressure reading, without diagnosis of hypertension: Secondary | ICD-10-CM

## 2012-02-18 DIAGNOSIS — R21 Rash and other nonspecific skin eruption: Secondary | ICD-10-CM

## 2012-02-18 DIAGNOSIS — IMO0001 Reserved for inherently not codable concepts without codable children: Secondary | ICD-10-CM

## 2012-02-18 MED ORDER — NYSTATIN-TRIAMCINOLONE 100000-0.1 UNIT/GM-% EX OINT: TOPICAL_OINTMENT | Freq: Two times a day (BID) | CUTANEOUS | Status: DC

## 2012-02-18 NOTE — Progress Notes (Signed)
  Subjective:    Patient ID: Rachel Keith, female    DOB: 06/18/1940, 71 y.o.   MRN: [id number]  HPI Rash along the crease of the neck x 1 week. Has been using neosporin but no relief. Says extremely itchy.  Not painful.  Says she was using a new neck cream for wrinkles.    Review of Systems     Objective:   Physical Exam  Constitutional: She appears well-developed and well-nourished.  HENT:  Head: Normocephalic and atraumatic.  Skin: Skin is warm and dry.       Erythema that is well demarcated along the crease in the neck.  No open wounds or sign of cellulitis.   Psychiatric: She has a normal mood and affect. Her behavior is normal.          Assessment & Plan:  Rash on Neck - Likley yeast or fungal or may be similar to intertrigo. Will tx with anti-fungal/steroid cream. Call if not at least 50% better in one week.    Elevated BP - Repeat much improved. She reports she is under a lot of stress lately.    No flu shot today.

## 2012-02-25 ENCOUNTER — Other Ambulatory Visit: Payer: Self-pay | Admitting: Family Medicine

## 2012-03-02 ENCOUNTER — Other Ambulatory Visit: Payer: Self-pay | Admitting: Family Medicine

## 2012-03-04 ENCOUNTER — Other Ambulatory Visit: Payer: Self-pay | Admitting: Family Medicine

## 2012-03-22 ENCOUNTER — Ambulatory Visit: Payer: Medicare Other | Admitting: Family Medicine

## 2012-04-07 ENCOUNTER — Ambulatory Visit: Payer: Medicare Other | Admitting: Family Medicine

## 2012-04-12 ENCOUNTER — Other Ambulatory Visit: Payer: Self-pay | Admitting: Family Medicine

## 2012-04-14 ENCOUNTER — Ambulatory Visit (INDEPENDENT_AMBULATORY_CARE_PROVIDER_SITE_OTHER): Payer: Medicare Other | Admitting: Family Medicine

## 2012-04-14 ENCOUNTER — Ambulatory Visit: Payer: Medicare Other | Admitting: Family Medicine

## 2012-04-15 ENCOUNTER — Ambulatory Visit (INDEPENDENT_AMBULATORY_CARE_PROVIDER_SITE_OTHER): Payer: Medicare Other | Admitting: Family Medicine

## 2012-04-15 ENCOUNTER — Encounter: Payer: Self-pay | Admitting: Family Medicine

## 2012-04-15 VITALS — BP 147/63 | HR 52 | Ht 62.0 in | Wt 134.0 lb

## 2012-04-15 DIAGNOSIS — R42 Dizziness and giddiness: Secondary | ICD-10-CM

## 2012-04-15 DIAGNOSIS — I10 Essential (primary) hypertension: Secondary | ICD-10-CM

## 2012-04-15 DIAGNOSIS — R350 Frequency of micturition: Secondary | ICD-10-CM

## 2012-04-15 DIAGNOSIS — F430 Acute stress reaction: Secondary | ICD-10-CM

## 2012-04-15 MED ORDER — AMLODIPINE BESYLATE 2.5 MG PO TABS: 2.5000 mg | ORAL_TABLET | Freq: Every day | ORAL | Status: DC

## 2012-04-15 MED ORDER — DIAZEPAM 10 MG PO TABS: 10.0000 mg | ORAL_TABLET | Freq: Every day | ORAL | Status: DC | PRN

## 2012-04-15 MED ORDER — BENAZEPRIL HCL 40 MG PO TABS: 40.0000 mg | ORAL_TABLET | Freq: Every day | ORAL | Status: DC

## 2012-04-15 NOTE — Patient Instructions (Signed)
Stop the atenolol and start atenolol.

## 2012-04-15 NOTE — Progress Notes (Signed)
Subjective:    Patient ID: Rachel Keith, female    DOB: [birth year], 71 y.o.   MRN: [id number]  HPI Has felt lightheaded x couple of weeks. Has been very stressed.  Her daughter was in a car accident.  They are also doing construction the road near her home and caused some choas.  No known triggers. Usually happens when up and about.  Says only happened once when sitting watching TV.  Feels ike she is just tense and on edge.  Would like a refill on her valium.   She says a few weeks ago before the lightheaded spell started she was actually having tachycardic events. She says would last for a few seconds and then suddenly she would need to use the restroom. She said she would P. and normocephalic and then within 5 minutes to get up and PM normocephalic again. She said literally she would get up in P5 times a very large amount and then would suddenly feel better. She says has happened several times. She says it has not happened since. She denies any dysuria or fever. She denies any previous bladder problems. I also asked her she has to strain sometimes into her bladder and she denies this. No fever.   Review of Systems     Objective:   Physical Exam  Constitutional: She is oriented to person, place, and time. She appears well-developed and well-nourished.  HENT:  Head: Normocephalic and atraumatic.  Neck: Neck supple. No thyromegaly present.  Cardiovascular: Normal rate and regular rhythm.        No carotid bruits, 1/6 harsh SEM  Pulmonary/Chest: Effort normal and breath sounds normal.  Lymphadenopathy:    She has no cervical adenopathy.  Neurological: She is alert and oriented to person, place, and time.  Skin: Skin is warm and dry.  Psychiatric: She has a normal mood and affect. Her behavior is normal.          Assessment & Plan:  Lightheadedness - BP is not low or too high today. She is taking atenolol 100mg  BID.  Pulse is low at 52 and this could be contributing to symptoms. I did  do an EKG today which showed rate of 50 bpm, NSR, no acute changes.  I also look back at the notes where she saw Dr. Jens Som in September 2012. He actually decreased her atenolol to 50 mg at that time. At this point I think about completely discontinue the atenolol and start amlodipine. Sometimes calcium channel blockers to have as much lowering of heart rate is beta blockers so we'll see if this is helpful. We can always consider restarting atenolol 50 mg if needed but I would not go above that. Followup in one month to recheck blood pressure.  Acute situational stress-I. did refill her Valium today. Encouraged her to continue use it sparingly. If she still struggling see her back we may consider starting an SSRI. Hopefully some of the issues in her life will resolve by then.  Hypertension-is not as optimal as I would like but since we are changing her regimen we will see what it is in one month.  Time spent 40 minutes, greater than 50% spent in counseling.  Urinary frequency-am unclear about her episodes we will perform urinalysis to make sure there's no sign infection or glucosuria. Certainly she could have some distention of the bladder that may be caused some tachycardia because of retention and then finally had a release of urine but I'm not sure. She's not  interested in seeing a urologist at this time.

## 2012-04-16 ENCOUNTER — Encounter: Payer: Self-pay | Admitting: *Deleted

## 2012-04-16 ENCOUNTER — Other Ambulatory Visit: Payer: Self-pay | Admitting: Family Medicine

## 2012-04-16 ENCOUNTER — Other Ambulatory Visit: Payer: Self-pay | Admitting: *Deleted

## 2012-04-16 LAB — URINALYSIS, ROUTINE W REFLEX MICROSCOPIC

## 2012-04-16 LAB — TSH: TSH: 7.587 u[IU]/mL — ABNORMAL HIGH (ref 0.350–4.500)

## 2012-04-16 LAB — CBC WITH DIFFERENTIAL/PLATELET
Basophils Relative: 1 % (ref 0–1)
Eosinophils Relative: 5 % (ref 0–5)
HCT: 41.8 % (ref 36.0–46.0)
Hemoglobin: 14.1 g/dL (ref 12.0–15.0)
Lymphocytes Relative: 32 % (ref 12–46)
MCHC: 33.7 g/dL (ref 30.0–36.0)
MCV: 97.2 fL (ref 78.0–100.0)
Monocytes Absolute: 0.8 10*3/uL (ref 0.1–1.0)
Monocytes Relative: 7 % (ref 3–12)
Neutro Abs: 6 10*3/uL (ref 1.7–7.7)

## 2012-04-16 LAB — COMPLETE METABOLIC PANEL WITH GFR
ALT: 14 U/L (ref 0–35)
AST: 19 U/L (ref 0–37)
Albumin: 4.3 g/dL (ref 3.5–5.2)
CO2: 30 mEq/L (ref 19–32)
Calcium: 11.4 mg/dL — ABNORMAL HIGH (ref 8.4–10.5)
Chloride: 104 mEq/L (ref 96–112)
GFR, Est African American: 60 mL/min
Potassium: 4.1 mEq/L (ref 3.5–5.3)
Sodium: 144 mEq/L (ref 135–145)
Total Protein: 6.7 g/dL (ref 6.0–8.3)

## 2012-04-19 ENCOUNTER — Other Ambulatory Visit: Payer: Self-pay | Admitting: *Deleted

## 2012-04-19 ENCOUNTER — Other Ambulatory Visit: Payer: Self-pay | Admitting: Physician Assistant

## 2012-04-19 ENCOUNTER — Other Ambulatory Visit: Payer: Self-pay | Admitting: Family Medicine

## 2012-04-19 LAB — URINALYSIS, ROUTINE W REFLEX MICROSCOPIC
Bilirubin Urine: NEGATIVE
Glucose, UA: NEGATIVE mg/dL
Ketones, ur: NEGATIVE mg/dL
Specific Gravity, Urine: 1.025 (ref 1.005–1.030)

## 2012-04-19 MED ORDER — LEVOTHYROXINE SODIUM 75 MCG PO TABS: 75.0000 ug | ORAL_TABLET | Freq: Every day | ORAL | Status: DC

## 2012-04-26 ENCOUNTER — Telehealth: Payer: Self-pay | Admitting: *Deleted

## 2012-04-26 LAB — COMPREHENSIVE METABOLIC PANEL
CO2: 26 mEq/L (ref 19–32)
Glucose, Bld: 77 mg/dL (ref 70–99)
Sodium: 145 mEq/L (ref 135–145)
Total Bilirubin: 0.5 mg/dL (ref 0.3–1.2)
Total Protein: 6.4 g/dL (ref 6.0–8.3)

## 2012-04-26 NOTE — Telephone Encounter (Signed)
Lab entered

## 2012-05-24 ENCOUNTER — Ambulatory Visit (INDEPENDENT_AMBULATORY_CARE_PROVIDER_SITE_OTHER): Payer: Medicare Other | Admitting: Family Medicine

## 2012-05-24 ENCOUNTER — Encounter: Payer: Self-pay | Admitting: Family Medicine

## 2012-05-24 DIAGNOSIS — I10 Essential (primary) hypertension: Secondary | ICD-10-CM

## 2012-05-24 DIAGNOSIS — E039 Hypothyroidism, unspecified: Secondary | ICD-10-CM

## 2012-05-24 LAB — TSH: TSH: 1.134 u[IU]/mL (ref 0.350–4.500)

## 2012-05-24 NOTE — Progress Notes (Signed)
  Subjective:    Patient ID: Rachel Keith, female    DOB: 08-29-1940, 72 y.o.   MRN: [id number]  HPI F/U  HTN - Says the amlodipine made her heart feel strange so she stopped it and started the atenolol 50mg  once a day.No CP, SOB, or dizziness on the medication.    Hypothyroid -  Also started her thyroid medication in the AM 1 hour before breakfast on empty stomach.  Says hasn't felt like needed to take a nap or hasn't felt sluggish.  Hasn't felt ligheadedness or dizziness. She has been very happy with her results on the thyroid medication.     Review of Systems     Objective:   Physical Exam  Constitutional: She is oriented to person, place, and time. She appears well-developed and well-nourished.  HENT:  Head: Normocephalic and atraumatic.  Cardiovascular: Normal rate, regular rhythm and normal heart sounds.   Pulmonary/Chest: Effort normal and breath sounds normal.  Neurological: She is alert and oriented to person, place, and time.  Skin: Skin is warm and dry.  Psychiatric: She has a normal mood and affect. Her behavior is normal.          Assessment & Plan:  Hypothyroid - She feels great on her current regimen.  Due to recheck TSH today. Recheck again in 3 months.   HTN - Uncontrolled. She will restart the amlodipine and keep the 50mg  of atenolol.  F/U in 2 months to recheck BP.

## 2012-05-25 LAB — PTH, INTACT AND CALCIUM: PTH: 2.7 pg/mL — ABNORMAL LOW (ref 14.0–72.0)

## 2012-05-26 ENCOUNTER — Telehealth: Payer: Self-pay | Admitting: Family Medicine

## 2012-05-26 ENCOUNTER — Other Ambulatory Visit: Payer: Self-pay

## 2012-05-26 DIAGNOSIS — E039 Hypothyroidism, unspecified: Secondary | ICD-10-CM

## 2012-05-26 MED ORDER — LEVOTHYROXINE SODIUM 75 MCG PO TABS: 75.0000 ug | ORAL_TABLET | Freq: Every day | ORAL | Status: DC

## 2012-05-26 NOTE — Telephone Encounter (Signed)
Sent [facility] to [facility].

## 2012-05-26 NOTE — Telephone Encounter (Signed)
Patient called request to know if she can have her thyroid medicine called into [facility] (Synthroid). She request to have a nurse call her back to advise her if that can be done. Thanks

## 2012-05-27 ENCOUNTER — Telehealth: Payer: Self-pay | Admitting: *Deleted

## 2012-05-27 ENCOUNTER — Telehealth: Payer: Self-pay

## 2012-05-27 MED ORDER — HYDROCHLOROTHIAZIDE 12.5 MG PO CAPS: 12.5000 mg | ORAL_CAPSULE | Freq: Every day | ORAL | Status: DC

## 2012-05-27 NOTE — Telephone Encounter (Signed)
Ok to call [facility] for both. There is no way to put in dessicated thyroid in the computer so we will hav to call [facility] personally.

## 2012-05-27 NOTE — Telephone Encounter (Signed)
rx sent

## 2012-05-27 NOTE — Telephone Encounter (Signed)
Pt is wanting to know if there is a very low dosage fluid pill that she could take to help with her bp.  Please advise

## 2012-05-27 NOTE — Telephone Encounter (Signed)
Rachel Keith called and is ready to start the fluid pill if it is a low dose and sent to [facility].

## 2012-05-27 NOTE — Telephone Encounter (Signed)
Spoke with [facility] & they said the patients thyroid medicine needs to be ordered as desiccated thyroid. Also spoke with pt & she will go on the fluid pill but requests that it be sent to [facility] to be compounded naturally..understands that this will be more expensive but will pay the extra to have natural.  Please send this rx to [facility].

## 2012-05-27 NOTE — Telephone Encounter (Signed)
Rachel Keith called and states she will try the fluid pill as long as it is a low dose and sent to [facility].

## 2012-05-28 NOTE — Telephone Encounter (Signed)
Spoke with [facility] & the thyroid medicine was filled.  They encouraged pt to get the fluid pill from another pharmacy, that it would be cheaper.

## 2012-06-04 ENCOUNTER — Ambulatory Visit (INDEPENDENT_AMBULATORY_CARE_PROVIDER_SITE_OTHER): Payer: Medicare Other | Admitting: Family Medicine

## 2012-06-04 VITALS — BP 170/62 | HR 44 | Wt 132.0 lb

## 2012-06-04 DIAGNOSIS — I10 Essential (primary) hypertension: Secondary | ICD-10-CM

## 2012-06-04 MED ORDER — DILTIAZEM HCL ER 60 MG PO CP12: 60.0000 mg | ORAL_CAPSULE | Freq: Two times a day (BID) | ORAL | Status: DC

## 2012-06-04 NOTE — Progress Notes (Signed)
  Subjective:    Patient ID: Rachel Keith, female    DOB: 09/13/1940, 72 y.o.   MRN: [id number]  HPI    Review of Systems     Objective:   Physical Exam        Assessment & Plan:

## 2012-06-04 NOTE — Addendum Note (Signed)
Addended by: Nani Gasser D on: 06/04/2012 05:01 PM   Modules accepted: Orders

## 2012-06-04 NOTE — Progress Notes (Signed)
  Subjective:    Patient ID: Rachel Keith, female    DOB: 19-Aug-1940, 72 y.o.   MRN: [id number] Pt denies CP, SOB, dizziness, or heart palpitations. taking meds as directed without problems. Denies med side effects. 5 min spent with pt.  Patient states she is not taking the amlodipine 2.5mg  because it made her feel funny. Take the HCTZ and atenolol at bedtime and the Lotensin in the mornings HPI    Review of Systems     Objective:   Physical Exam        Assessment & Plan:  HTN- Uncontrolled. I do think she needs an additional agent. If she didn't tolerate the amlodipine well we can probably try something like diltiazem. Let me know if  okay to send over to the pharmacy. Nani Gasser, MD

## 2012-06-04 NOTE — Progress Notes (Signed)
  Subjective:    Patient ID: Rachel Keith, female    DOB: 01-Sep-1940, 72 y.o.   MRN: [id number]  HPI    Review of Systems     Objective:   Physical Exam        Assessment & Plan:  Pt agrees to taking the BP med and please send to [facility]

## 2012-06-09 ENCOUNTER — Other Ambulatory Visit: Payer: Self-pay | Admitting: *Deleted

## 2012-06-09 MED ORDER — DIAZEPAM 10 MG PO TABS: 10.0000 mg | ORAL_TABLET | Freq: Every day | ORAL | Status: DC | PRN

## 2012-06-09 MED ORDER — BUTALBITAL-ASPIRIN-CAFFEINE 50-325-40 MG PO CAPS: 1.0000 | ORAL_CAPSULE | ORAL | Status: DC | PRN

## 2012-06-17 ENCOUNTER — Other Ambulatory Visit: Payer: Self-pay | Admitting: Family Medicine

## 2012-06-17 MED ORDER — FLUTICASONE PROPIONATE 50 MCG/ACT NA SUSP: 2.0000 | Freq: Every day | NASAL | Status: DC

## 2012-07-05 ENCOUNTER — Telehealth: Payer: Self-pay

## 2012-07-05 NOTE — Telephone Encounter (Signed)
I called patient to see if she wanted to switch dorm Qnasl to Flonase. She does not. She is working with the insurance company to get it at a lower price.

## 2012-08-07 ENCOUNTER — Other Ambulatory Visit: Payer: Self-pay | Admitting: Family Medicine

## 2012-09-20 ENCOUNTER — Ambulatory Visit (INDEPENDENT_AMBULATORY_CARE_PROVIDER_SITE_OTHER): Payer: Medicare Other | Admitting: Family Medicine

## 2012-09-20 ENCOUNTER — Encounter: Payer: Self-pay | Admitting: Family Medicine

## 2012-09-20 VITALS — BP 130/67 | HR 55 | Ht 61.75 in | Wt 134.0 lb

## 2012-09-20 DIAGNOSIS — J309 Allergic rhinitis, unspecified: Secondary | ICD-10-CM

## 2012-09-20 DIAGNOSIS — L659 Nonscarring hair loss, unspecified: Secondary | ICD-10-CM

## 2012-09-20 DIAGNOSIS — E039 Hypothyroidism, unspecified: Secondary | ICD-10-CM

## 2012-09-20 DIAGNOSIS — R1310 Dysphagia, unspecified: Secondary | ICD-10-CM

## 2012-09-20 DIAGNOSIS — I10 Essential (primary) hypertension: Secondary | ICD-10-CM

## 2012-09-20 MED ORDER — ATENOLOL 50 MG PO TABS: 50.0000 mg | ORAL_TABLET | Freq: Every day | ORAL | Status: DC

## 2012-09-20 NOTE — Progress Notes (Signed)
  Subjective:    Patient ID: Rachel Keith, female    DOB: 1940/11/17, 72 y.o.   MRN: [id number]  HPI Hypertension-four-month followup. Her blood pressure was out of control and I saw her last time in January. We added diltiazem to her medications.Tolerating it well.  She had not tolerated amlodipine well in the past. She is already on atenolol, benazepril, and hydrochlorothiazide (Prn). No CP or SOB. Lightheadedness or dizziness.  Hypothyroidism-no recent skin or hair changes.  No recent weight changes. Says her hair loss has improved significantly. She's taking her medication regularly without any problems or side effects.  She has had allergies and was recently dx with allergies.  Was taking allergy meds and Aspirin.  She's actually feeling much better now.  Dysphagia x2 months. She's noticing that occasionally her pills will get stuck in her throat. Not every time. She says she will have to take something carbonated to get it to finally pass through. She's not expensing short of breath with it but says that she wonders if it could be her thyroid gland causing it. She denies any problems swallowing regular foods or meats or fluids. No throat pain or reflux. She also notes some hiccuping at night recently which is new.  Review of Systems     Objective:   Physical Exam  Constitutional: She is oriented to person, place, and time. She appears well-developed and well-nourished.  HENT:  Head: Normocephalic and atraumatic.  Cardiovascular: Normal rate, regular rhythm and normal heart sounds.   Pulmonary/Chest: Effort normal and breath sounds normal.  Neurological: She is alert and oriented to person, place, and time.  Skin: Skin is warm and dry.  Psychiatric: She has a normal mood and affect. Her behavior is normal.          Assessment & Plan:  Hypertension-Well controlled.  F/U in 6 months.  She has a home BP cuff now.    Hypothyroidism- will recheck levels today. Hari loss improved.   Lab Results  Component Value Date   TSH 1.134 05/24/2012   Allergic rhinitis - Much improved. No rx meds given.   Dysphagia- Recommended referral to GI for possible esophageal stricture.  She opts to hold off on this for now. I encouraged her to think about it. Explained her that typically this requires endoscopy for diagnosis but that they can treat it during the endoscopy and is very easy to fix. She will think about it. Certainly if it becomes more frequent or suddenly getting worse and she definitely needs referral.

## 2012-09-21 LAB — BASIC METABOLIC PANEL WITH GFR
BUN: 22 mg/dL (ref 6–23)
Chloride: 106 mEq/L (ref 96–112)
Glucose, Bld: 80 mg/dL (ref 70–99)
Potassium: 5.1 mEq/L (ref 3.5–5.3)
Sodium: 140 mEq/L (ref 135–145)

## 2012-11-05 ENCOUNTER — Other Ambulatory Visit: Payer: Self-pay | Admitting: *Deleted

## 2012-11-05 MED ORDER — ATENOLOL 50 MG PO TABS: 50.0000 mg | ORAL_TABLET | Freq: Every day | ORAL | Status: DC

## 2012-11-09 ENCOUNTER — Other Ambulatory Visit: Payer: Self-pay | Admitting: *Deleted

## 2012-11-09 MED ORDER — BUTALBITAL-ASPIRIN-CAFFEINE 50-325-40 MG PO CAPS: 1.0000 | ORAL_CAPSULE | ORAL | Status: DC | PRN

## 2012-11-23 ENCOUNTER — Other Ambulatory Visit: Payer: Self-pay | Admitting: *Deleted

## 2012-11-23 MED ORDER — AMBULATORY NON FORMULARY MEDICATION: Status: DC

## 2013-02-03 ENCOUNTER — Other Ambulatory Visit: Payer: Self-pay | Admitting: Family Medicine

## 2013-03-04 ENCOUNTER — Other Ambulatory Visit: Payer: Self-pay | Admitting: Family Medicine

## 2013-03-12 ENCOUNTER — Other Ambulatory Visit: Payer: Self-pay | Admitting: Family Medicine

## 2013-03-22 ENCOUNTER — Other Ambulatory Visit: Payer: Self-pay | Admitting: Family Medicine

## 2013-03-23 ENCOUNTER — Encounter: Payer: Self-pay | Admitting: Family Medicine

## 2013-03-23 ENCOUNTER — Ambulatory Visit (INDEPENDENT_AMBULATORY_CARE_PROVIDER_SITE_OTHER): Payer: Medicare Other | Admitting: Family Medicine

## 2013-03-23 ENCOUNTER — Ambulatory Visit (INDEPENDENT_AMBULATORY_CARE_PROVIDER_SITE_OTHER): Payer: Medicare Other

## 2013-03-23 VITALS — BP 168/80 | HR 58 | Wt 140.0 lb

## 2013-03-23 DIAGNOSIS — E049 Nontoxic goiter, unspecified: Secondary | ICD-10-CM

## 2013-03-23 DIAGNOSIS — E039 Hypothyroidism, unspecified: Secondary | ICD-10-CM

## 2013-03-23 DIAGNOSIS — E010 Iodine-deficiency related diffuse (endemic) goiter: Secondary | ICD-10-CM

## 2013-03-23 DIAGNOSIS — E041 Nontoxic single thyroid nodule: Secondary | ICD-10-CM

## 2013-03-23 DIAGNOSIS — I10 Essential (primary) hypertension: Secondary | ICD-10-CM

## 2013-03-23 NOTE — Progress Notes (Signed)
Subjective:    Patient ID: Rachel Keith, female    DOB: Jun 16, 1940, 72 y.o.   MRN: [id number]  HPI  HTN- has been getting BPs 120-140s at home.  Pt denies chest pain, SOB, dizziness, or heart palpitations.  Taking meds as directed w/o problems.  Denies medication side effects.  Found out some bad news yesterday and feels really stressed today.  Has been keeping her log but forgot to bring it in.   Has noticed occ hard time swallowing in her throat, sometimes even w/ soft foods.  Has noticed her neck crease looks red when wakes up. She denies any actual rash he says his skin does look a little bit red. Says will try to drink water to get it to move.  No vomiting. No fever, chills or sweats.  The skin doesn't itch over the neck.    Review of Systems  BP 168/80  Pulse 58  Wt 140 lb (63.504 kg)  SpO2 100%    Allergies  Allergen Reactions  . Amlodipine Other (See Comments)    Didn't feel well on 2.5 mg dose.     Past Medical History  Diagnosis Date  . Hypertension     Past Surgical History  Procedure Laterality Date  . Cataract extraction  09/06/2010  . Abdominal hysterectomy    . Appendectomy    . Tonsillectomy    . Cholecystectomy    . Left ankle surgery    . Finger surgery      History   Social History  . Marital Status: Unknown    Spouse Name: N/A    Number of Children: 4  . Years of Education: N/A   Occupational History  . retired    Social History Main Topics  . Smoking status: Never Smoker   . Smokeless tobacco: Not on file  . Alcohol Use: 0.0 oz/week    0 drink(s) per week     Comment: occasional  . Drug Use: No  . Sexual Activity: No   Other Topics Concern  . Not on file   Social History Narrative   1 caffeinated drink daily     Family History  Problem Relation Age of Onset  .       Outpatient Encounter Prescriptions as of 03/23/2013  Medication Sig  . AMBULATORY NON FORMULARY MEDICATION Tretinoin 0.03% cream.  Apply topically pea size (5mm)  to facial area .  Marland Kitchen AMBULATORY NON FORMULARY MEDICATION Medication Name: Biest 5mg /ml Cream. Apply 1 mL to inner arms or upper thighs daily.  . AMBULATORY NON FORMULARY MEDICATION Progesterone 6 % cream Apply 1/2 ml to upper arm or inner thigh once daily at night Disp 45 ml  . AMBULATORY NON FORMULARY MEDICATION Medication Name: Progesterone 75 mg SR capsules. Take one capsule at bedtime.  Marland Kitchen atenolol (TENORMIN) 50 MG tablet Take 1 tablet (50 mg total) by mouth daily.  Marland Kitchen BECONASE AQ 42 MCG/SPRAY nasal spray USE 2 SPRAYS IN EACH NOSTRIL DAILY  . benazepril (LOTENSIN) 40 MG tablet TAKE 1 TABLET BY MOUTH EVERY DAY  . diazepam (VALIUM) 10 MG tablet TAKE 1 TABLET BY MOUTH DAILY AS NEEDED FOR ANXIETY  . diltiazem (CARDIZEM SR) 60 MG 12 hr capsule TAKE ONE CAPSULE BY MOUTH TWICE A DAY  . FIORINAL 50-325-40 MG per capsule TAKE ONE CAPSULE BY MOUTH EVERY 4 HOURS AS NEEDED FOR HEADACHE  . Garlic TABS Take 1 tablet by mouth 2 (two) times daily.  Marland Kitchen levothyroxine (SYNTHROID, LEVOTHROID) 75 MCG tablet Take 1  tablet (75 mcg total) by mouth daily.  Marland Kitchen nystatin-triamcinolone ointment (MYCOLOG) Apply topically 2 (two) times daily.  . [DISCONTINUED] AMBULATORY NON FORMULARY MEDICATION Medication Name: DHEA 5 mg per 0.5 mL cream. Apply 0.5 mL behind the knee or ulnar wrist daily.  . [DISCONTINUED] fluticasone (FLONASE) 50 MCG/ACT nasal spray Place 2 sprays into the nose daily.  . [DISCONTINUED] hydrochlorothiazide (MICROZIDE) 12.5 MG capsule Take 1 capsule (12.5 mg total) by mouth daily.          Objective:   Physical Exam  Constitutional: She is oriented to person, place, and time. She appears well-developed and well-nourished.  HENT:  Head: Normocephalic and atraumatic.  Thyroid is enlarged but feels symmetric   Neck: Neck supple. Thyromegaly present.  No thyroid nodules  Cardiovascular: Normal rate, regular rhythm and normal heart sounds.   Pulmonary/Chest: Effort normal and breath sounds normal.   Lymphadenopathy:    She has no cervical adenopathy.  Neurological: She is alert and oriented to person, place, and time.  Skin: Skin is warm and dry.  There is a littlle redenss along her neck crease but no rash.   Psychiatric: She has a normal mood and affect. Her behavior is normal.          Assessment & Plan:  HTN- well controlled at home.  Repeat blood pressure was a little bit lower but still elevated. Her home blood pressures seem to be well-controlled we will continue current regimen for now. Continue check blood pressures twice a week and bring in log with her next time.  Dysphagia-unclear etiology. Her thyroid is a little bit enlarged on exam today so this could certainly be the cause. She has no acute symptoms such as fever or tenderness of the thyroid gland to suggest an acute thyroiditis. Also consider that this could be from esophageal stricture. We will start by evaluating the thyroid with ultrasound and blood work. Maybe her disease we changed her thyroid medication. Otherwise consider GI referral for possible endoscopy to evaluate for esophageal stricture.  Thyromegaly-will recheck TSH and we'll schedule her for ultrasound of the thyroid gland.

## 2013-03-24 ENCOUNTER — Other Ambulatory Visit: Payer: Self-pay | Admitting: Family Medicine

## 2013-03-24 DIAGNOSIS — E039 Hypothyroidism, unspecified: Secondary | ICD-10-CM

## 2013-03-24 DIAGNOSIS — R1312 Dysphagia, oropharyngeal phase: Secondary | ICD-10-CM

## 2013-03-24 LAB — LIPID PANEL
Cholesterol: 193 mg/dL (ref 0–200)
HDL: 45 mg/dL (ref >39)
LDL Cholesterol: 104 mg/dL — ABNORMAL HIGH (ref 0–99)
Total CHOL/HDL Ratio: 4.3 Ratio

## 2013-03-24 LAB — COMPLETE METABOLIC PANEL WITH GFR
AST: 23 U/L (ref 0–37)
Albumin: 4.3 g/dL (ref 3.5–5.2)
Alkaline Phosphatase: 69 U/L (ref 39–117)
BUN: 18 mg/dL (ref 6–23)
CO2: 26 mEq/L (ref 19–32)
Calcium: 9.7 mg/dL (ref 8.4–10.5)
Chloride: 106 mEq/L (ref 96–112)
GFR, Est African American: 72 mL/min
GFR, Est Non African American: 63 mL/min
Glucose, Bld: 77 mg/dL (ref 70–99)
Potassium: 4.4 mEq/L (ref 3.5–5.3)
Sodium: 141 mEq/L (ref 135–145)
Total Bilirubin: 0.4 mg/dL (ref 0.3–1.2)
Total Protein: 7 g/dL (ref 6.0–8.3)

## 2013-03-24 LAB — T3, FREE: T3, Free: 2.7 pg/mL (ref 2.3–4.2)

## 2013-03-24 MED ORDER — LEVOTHYROXINE SODIUM 88 MCG PO TABS: 88.0000 ug | ORAL_TABLET | Freq: Every day | ORAL | Status: DC

## 2013-03-28 ENCOUNTER — Telehealth: Payer: Self-pay | Admitting: *Deleted

## 2013-03-28 DIAGNOSIS — E039 Hypothyroidism, unspecified: Secondary | ICD-10-CM

## 2013-03-28 NOTE — Telephone Encounter (Signed)
Pt called because she stated that [facility] is a generic and the pharmacist told her that she may want to speak with Dr. Linford Arnold before starting this being that the medication may not get her the results that she desiring. Pt stated that she would like for the new rx [facility]. I informed her that Dr.Metheney is out of the office today however, she will be back tomorrow. She stated that this is fine.Rachel Keith

## 2013-03-29 MED ORDER — LEVOTHYROXINE SODIUM 88 MCG PO TABS: 88.0000 ug | ORAL_TABLET | Freq: Every day | ORAL | Status: DC

## 2013-03-29 NOTE — Telephone Encounter (Signed)
Pt notified and she wanted to tell dr. Thornell Sartorius YOU SO MUCH! She is so appreciative for this .Rachel Keith

## 2013-03-29 NOTE — Telephone Encounter (Signed)
Will print rx and send to [facility].

## 2013-04-26 ENCOUNTER — Other Ambulatory Visit: Payer: Self-pay | Admitting: Family Medicine

## 2013-06-08 ENCOUNTER — Telehealth: Payer: Self-pay | Admitting: *Deleted

## 2013-06-08 DIAGNOSIS — E039 Hypothyroidism, unspecified: Secondary | ICD-10-CM

## 2013-06-08 LAB — TSH: TSH: 3.141 u[IU]/mL (ref 0.350–4.500)

## 2013-06-08 NOTE — Telephone Encounter (Signed)
Pt wanted to discuss thyroid. appt made.Loralee PacasBarkley, Kairee Isa AspersLynetta

## 2013-06-09 NOTE — Telephone Encounter (Signed)
Quick Note:  All labs are normal. ______ 

## 2013-06-14 ENCOUNTER — Ambulatory Visit: Payer: Medicare Other | Admitting: Family Medicine

## 2013-07-25 ENCOUNTER — Ambulatory Visit (INDEPENDENT_AMBULATORY_CARE_PROVIDER_SITE_OTHER): Payer: Medicare Other | Admitting: Family Medicine

## 2013-07-25 ENCOUNTER — Other Ambulatory Visit: Payer: Self-pay | Admitting: Family Medicine

## 2013-07-25 ENCOUNTER — Encounter: Payer: Self-pay | Admitting: Family Medicine

## 2013-07-25 VITALS — BP 132/70 | HR 55 | Wt 136.0 lb

## 2013-07-25 DIAGNOSIS — Z79890 Hormone replacement therapy (postmenopausal): Secondary | ICD-10-CM

## 2013-07-25 DIAGNOSIS — E039 Hypothyroidism, unspecified: Secondary | ICD-10-CM

## 2013-07-25 DIAGNOSIS — R1310 Dysphagia, unspecified: Secondary | ICD-10-CM

## 2013-07-25 DIAGNOSIS — L659 Nonscarring hair loss, unspecified: Secondary | ICD-10-CM

## 2013-07-25 NOTE — Progress Notes (Signed)
   Subjective:    Patient ID: Rachel Keith, female    DOB: 20-Jan-1941, 73 y.o.   MRN: [id number]  HPI Hypothyroidism-no recent skin or hair changes. fels better on increased thyroid dose.   Hair loss-she did start an over-the-counter topical compounded cream for hair loss. She also switched from the topical to the oral Bi-est bc kept forgetting to put the cream on. She has been on the pill for 4 days and has already noticed less hair loss.   Hypertension- Pt denies chest pain, SOB, dizziness, or heart palpitations.  Taking meds as directed w/o problems.  Denies medication side effects.    Dysphagia - did see GI and they recommended endoscopy.  She delayed it because of cost but now her sxs have resolved.    Review of Systems     Objective:   Physical Exam  Constitutional: She is oriented to person, place, and time. She appears well-developed and well-nourished.  HENT:  Head: Normocephalic and atraumatic.  Cardiovascular: Normal rate, regular rhythm and normal heart sounds.   Pulmonary/Chest: Effort normal and breath sounds normal.  Neurological: She is alert and oriented to person, place, and time.  Skin: Skin is warm and dry.  Psychiatric: She has a normal mood and affect. Her behavior is normal.          Assessment & Plan:  Hypothyroidism- She is feeling much better on the T4 88mcg SR cap daily.  Will recheck TSH in 2 weeks.  HRT - now on oral regimen. She feels much better. Will recheck  homrones in 2 weeks.   Hypertension- Well controlled.  Continue currenet regimen. F/U in 6 months.   Hair loss- will start a topical compoundedcream that has minoxidil and a topical steroid cream. Will see if helps over the next 6 mo .   Dysphagia - Says it is better so she wants to hold off on the scope.

## 2013-09-22 ENCOUNTER — Other Ambulatory Visit: Payer: Self-pay | Admitting: *Deleted

## 2013-09-22 ENCOUNTER — Telehealth: Payer: Self-pay | Admitting: *Deleted

## 2013-09-22 MED ORDER — AMBULATORY NON FORMULARY MEDICATION: Status: DC

## 2013-09-22 NOTE — Telephone Encounter (Signed)
I refilled pt's t4 88mcg through [facility] today & advised pt that you wanted to recheck her TSH.  She is asking why since they were checked in Jan & normal.  I didn't see any specifications in your note.

## 2013-09-22 NOTE — Telephone Encounter (Signed)
Sorry don't need to check right now. Can check in July which will be 6 months.

## 2013-09-23 ENCOUNTER — Other Ambulatory Visit: Payer: Self-pay

## 2013-09-23 DIAGNOSIS — E039 Hypothyroidism, unspecified: Secondary | ICD-10-CM

## 2013-09-23 MED ORDER — LEVOTHYROXINE SODIUM 88 MCG PO TABS: 88.0000 ug | ORAL_TABLET | Freq: Every day | ORAL | Status: DC

## 2013-09-23 NOTE — Telephone Encounter (Signed)
Patient advised.

## 2013-10-05 ENCOUNTER — Other Ambulatory Visit: Payer: Self-pay | Admitting: Family Medicine

## 2013-10-21 ENCOUNTER — Other Ambulatory Visit: Payer: Self-pay | Admitting: Family Medicine

## 2013-10-21 ENCOUNTER — Other Ambulatory Visit: Payer: Self-pay

## 2013-10-21 MED ORDER — AMBULATORY NON FORMULARY MEDICATION: Status: DC

## 2013-10-21 NOTE — Telephone Encounter (Signed)
Printed prescription for Tretinoin 0.03% to [facility].

## 2013-11-19 ENCOUNTER — Other Ambulatory Visit: Payer: Self-pay | Admitting: Family Medicine

## 2013-11-21 NOTE — Telephone Encounter (Signed)
Need f/u before future refills

## 2014-02-10 ENCOUNTER — Other Ambulatory Visit: Payer: Self-pay | Admitting: *Deleted

## 2014-02-13 MED ORDER — AMBULATORY NON FORMULARY MEDICATION: Status: DC

## 2014-03-28 ENCOUNTER — Other Ambulatory Visit: Payer: Self-pay | Admitting: *Deleted

## 2014-03-28 ENCOUNTER — Other Ambulatory Visit: Payer: Self-pay | Admitting: Family Medicine

## 2014-03-28 ENCOUNTER — Telehealth: Payer: Self-pay | Admitting: *Deleted

## 2014-03-28 MED ORDER — BENAZEPRIL HCL 40 MG PO TABS: 40.0000 mg | ORAL_TABLET | Freq: Every day | ORAL | Status: DC

## 2014-03-28 NOTE — Telephone Encounter (Signed)
Pt called and informed that she was to have followed up w/Dr. Linford ArnoldMetheney back in September for f/u on thyroid and bp. She informed me that he ex-husband stopped her monthly stipend of $1000. She stated that she never got a phone call about f/u with Dr. Linford ArnoldMetheney. I told her that this was printed out on her AVS. She had never noticed that before.Loralee PacasBarkley, Kayln Garceau BooneLynetta

## 2014-04-13 ENCOUNTER — Ambulatory Visit (INDEPENDENT_AMBULATORY_CARE_PROVIDER_SITE_OTHER): Payer: Medicare Other | Admitting: Family Medicine

## 2014-04-13 ENCOUNTER — Encounter: Payer: Self-pay | Admitting: Family Medicine

## 2014-04-13 VITALS — BP 181/72 | HR 51 | Wt 136.0 lb

## 2014-04-13 DIAGNOSIS — N3001 Acute cystitis with hematuria: Secondary | ICD-10-CM

## 2014-04-13 DIAGNOSIS — E785 Hyperlipidemia, unspecified: Secondary | ICD-10-CM

## 2014-04-13 DIAGNOSIS — I10 Essential (primary) hypertension: Secondary | ICD-10-CM

## 2014-04-13 DIAGNOSIS — E039 Hypothyroidism, unspecified: Secondary | ICD-10-CM

## 2014-04-13 DIAGNOSIS — R350 Frequency of micturition: Secondary | ICD-10-CM

## 2014-04-13 LAB — POCT URINALYSIS DIPSTICK
BILIRUBIN UA: NEGATIVE
GLUCOSE UA: NEGATIVE
Ketones, UA: NEGATIVE
Nitrite, UA: NEGATIVE
Protein, UA: NEGATIVE
Spec Grav, UA: 1.01
Urobilinogen, UA: 0.2
pH, UA: 6

## 2014-04-13 MED ORDER — DIAZEPAM 10 MG PO TABS: 10.0000 mg | ORAL_TABLET | Freq: Every day | ORAL | Status: DC | PRN

## 2014-04-13 MED ORDER — BENAZEPRIL HCL 40 MG PO TABS: 40.0000 mg | ORAL_TABLET | Freq: Every day | ORAL | Status: DC

## 2014-04-13 MED ORDER — CIPROFLOXACIN HCL 500 MG PO TABS: 500.0000 mg | ORAL_TABLET | Freq: Two times a day (BID) | ORAL | Status: AC

## 2014-04-13 MED ORDER — ATENOLOL 50 MG PO TABS: 50.0000 mg | ORAL_TABLET | Freq: Two times a day (BID) | ORAL | Status: DC

## 2014-04-13 MED ORDER — ATENOLOL 50 MG PO TABS: 50.0000 mg | ORAL_TABLET | Freq: Every day | ORAL | Status: DC

## 2014-04-13 NOTE — Progress Notes (Addendum)
   Subjective:    Patient ID: Rachel Keith, female    DOB: 12-15-1940, 73 y.o.   MRN: [id number]  HPI Hypertension- Pt denies chest pain, SOB, dizziness, or heart palpitations.  Taking meds as directed w/o problems.  Denies medication side effects.    She has been under a lot of stress with her family.  Her oldest son is now with a girl she feels is crazy.  She is losing some of her income.    Has been having some palpitations. Says it would start and then would have to urinate.  Says would urinate a large amount and then would have to go again. No dyruria or hematuria.  No inc thrist.   Hypothyroid - no skin or hair changes.  No changes in energy level.   Review of Systems     Objective:   Physical Exam  Constitutional: She is oriented to person, place, and time. She appears well-developed and well-nourished.  HENT:  Head: Normocephalic and atraumatic.  Neck: Neck supple. No thyromegaly present.  Cardiovascular: Normal rate, regular rhythm and normal heart sounds.   Pulmonary/Chest: Effort normal and breath sounds normal.  Lymphadenopathy:    She has no cervical adenopathy.  Neurological: She is alert and oriented to person, place, and time.  Skin: Skin is warm and dry.  Psychiatric: She has a normal mood and affect. Her behavior is normal.          Assessment & Plan:  Urinary frequency/urgency/UTI - UA pos for leuk and blood. Will tx with Cipro. Call if not better in one week.   HTN-  Uncontrolled. Increase atenolol to 50mg  BID.  F/U in 3 months.  Due for CMP and lipids but she wants to wait until Jan for labs. .   Hyperlipidemia-due to recheck lipids. labslip provided.   Hypothyroidism-due to recheck TSH.

## 2014-05-15 LAB — LIPID PANEL
CHOLESTEROL: 178 mg/dL (ref 0–200)
HDL: 55 mg/dL (ref >39)
LDL CALC: 79 mg/dL (ref 0–99)
Total CHOL/HDL Ratio: 3.2 Ratio
Triglycerides: 221 mg/dL — ABNORMAL HIGH (ref <150)
VLDL: 44 mg/dL — AB (ref 0–40)

## 2014-05-15 LAB — COMPLETE METABOLIC PANEL WITH GFR
ALBUMIN: 3.9 g/dL (ref 3.5–5.2)
ALT: 13 U/L (ref 0–35)
AST: 18 U/L (ref 0–37)
Alkaline Phosphatase: 53 U/L (ref 39–117)
BUN: 15 mg/dL (ref 6–23)
CHLORIDE: 107 meq/L (ref 96–112)
CO2: 30 meq/L (ref 19–32)
Calcium: 9.6 mg/dL (ref 8.4–10.5)
Creat: 1.12 mg/dL — ABNORMAL HIGH (ref 0.50–1.10)
GFR, EST AFRICAN AMERICAN: 56 mL/min — AB
GFR, EST NON AFRICAN AMERICAN: 49 mL/min — AB
GLUCOSE: 71 mg/dL (ref 70–99)
POTASSIUM: 3.8 meq/L (ref 3.5–5.3)
Sodium: 146 mEq/L — ABNORMAL HIGH (ref 135–145)
TOTAL PROTEIN: 6.8 g/dL (ref 6.0–8.3)
Total Bilirubin: 0.4 mg/dL (ref 0.2–1.2)

## 2014-05-15 LAB — TSH: TSH: 21.853 u[IU]/mL — ABNORMAL HIGH (ref 0.350–4.500)

## 2014-05-18 ENCOUNTER — Other Ambulatory Visit: Payer: Self-pay | Admitting: Family Medicine

## 2014-06-23 ENCOUNTER — Other Ambulatory Visit: Payer: Self-pay | Admitting: Family Medicine

## 2014-06-23 DIAGNOSIS — E039 Hypothyroidism, unspecified: Secondary | ICD-10-CM

## 2014-06-23 DIAGNOSIS — I10 Essential (primary) hypertension: Secondary | ICD-10-CM

## 2014-06-24 ENCOUNTER — Other Ambulatory Visit: Payer: Self-pay | Admitting: Family Medicine

## 2014-06-28 LAB — COMPLETE METABOLIC PANEL WITH GFR
ALK PHOS: 53 U/L (ref 39–117)
ALT: 11 U/L (ref 0–35)
AST: 18 U/L (ref 0–37)
Albumin: 4.3 g/dL (ref 3.5–5.2)
BUN: 14 mg/dL (ref 6–23)
CO2: 28 meq/L (ref 19–32)
CREATININE: 0.92 mg/dL (ref 0.50–1.10)
Calcium: 9.7 mg/dL (ref 8.4–10.5)
Chloride: 107 mEq/L (ref 96–112)
GFR, EST AFRICAN AMERICAN: 71 mL/min
GFR, EST NON AFRICAN AMERICAN: 62 mL/min
Glucose, Bld: 84 mg/dL (ref 70–99)
Potassium: 4 mEq/L (ref 3.5–5.3)
SODIUM: 143 meq/L (ref 135–145)
Total Bilirubin: 0.4 mg/dL (ref 0.2–1.2)
Total Protein: 7.1 g/dL (ref 6.0–8.3)

## 2014-06-28 LAB — TSH: TSH: 21.499 u[IU]/mL — ABNORMAL HIGH (ref 0.350–4.500)

## 2014-06-29 ENCOUNTER — Other Ambulatory Visit: Payer: Self-pay | Admitting: Family Medicine

## 2014-06-29 DIAGNOSIS — E038 Other specified hypothyroidism: Secondary | ICD-10-CM

## 2014-06-29 MED ORDER — LEVOTHYROXINE SODIUM 100 MCG PO TABS: 100.0000 ug | ORAL_TABLET | Freq: Every day | ORAL | Status: DC

## 2014-07-03 ENCOUNTER — Telehealth: Payer: Self-pay | Admitting: *Deleted

## 2014-07-03 ENCOUNTER — Other Ambulatory Visit: Payer: Self-pay | Admitting: *Deleted

## 2014-07-03 NOTE — Telephone Encounter (Signed)
-----   Message from Agapito Gamesatherine D Metheney, MD sent at 06/29/2014  9:58 PM EST ----- Call pt: repeat kidney function is much better.  Thyroid is still off. I will call in new dose of 100mcg. Recheck level in 4-6 weeks.

## 2014-07-03 NOTE — Telephone Encounter (Signed)
[phone] (Home)  Spoke with Patient about results. She reminded me of her medications that come [facility] # [phone].

## 2014-07-03 NOTE — Telephone Encounter (Signed)
Patient called back stating that she had an appt in [place] tomorrow & if we could go ahead & call new thyroid medication in. So she could pick up while in [place]. Called in Verbal order for new dosage of .

## 2014-07-24 ENCOUNTER — Other Ambulatory Visit: Payer: Self-pay | Admitting: Family Medicine

## 2014-07-24 DIAGNOSIS — E039 Hypothyroidism, unspecified: Secondary | ICD-10-CM

## 2014-07-28 LAB — TSH: TSH: 24.44 u[IU]/mL — AB (ref 0.350–4.500)

## 2014-07-31 ENCOUNTER — Other Ambulatory Visit: Payer: Self-pay | Admitting: Family Medicine

## 2014-07-31 DIAGNOSIS — E038 Other specified hypothyroidism: Secondary | ICD-10-CM

## 2014-07-31 MED ORDER — LEVOTHYROXINE SODIUM 125 MCG PO TABS: 125.0000 ug | ORAL_TABLET | Freq: Every day | ORAL | Status: DC

## 2014-08-02 ENCOUNTER — Other Ambulatory Visit: Payer: Self-pay | Admitting: Family Medicine

## 2014-08-02 DIAGNOSIS — E038 Other specified hypothyroidism: Secondary | ICD-10-CM

## 2014-08-02 MED ORDER — LEVOTHYROXINE SODIUM 125 MCG PO TABS: 125.0000 ug | ORAL_TABLET | Freq: Every day | ORAL | Status: DC

## 2014-08-17 ENCOUNTER — Other Ambulatory Visit: Payer: Self-pay | Admitting: Family Medicine

## 2014-08-17 MED ORDER — AMBULATORY NON FORMULARY MEDICATION: Status: DC

## 2014-08-28 ENCOUNTER — Other Ambulatory Visit: Payer: Self-pay | Admitting: Family Medicine

## 2014-08-28 DIAGNOSIS — E039 Hypothyroidism, unspecified: Secondary | ICD-10-CM

## 2014-08-28 DIAGNOSIS — E038 Other specified hypothyroidism: Secondary | ICD-10-CM

## 2014-08-29 LAB — TSH: TSH: 20.057 u[IU]/mL — ABNORMAL HIGH (ref 0.350–4.500)

## 2014-08-30 MED ORDER — LEVOTHYROXINE SODIUM 150 MCG PO TABS: 150.0000 ug | ORAL_TABLET | Freq: Every day | ORAL | Status: DC

## 2014-08-30 NOTE — Addendum Note (Signed)
Addended by: Nani GasserMETHENEY, CATHERINE D on: 08/30/2014 07:49 AM   Modules accepted: Orders

## 2014-09-27 ENCOUNTER — Telehealth: Payer: Self-pay | Admitting: *Deleted

## 2014-09-27 NOTE — Telephone Encounter (Addendum)
Pt's son called and stated that his [facility]. He reports that she was sent home with abx. Today she has blood in her urine. He stated that she doesn't want to come in. I advised that she is to schedule a f/u appt to be seen and transferred him to scheduling.Loralee PacasBarkley, Ronneisha Jett SevilleLynetta

## 2014-09-30 ENCOUNTER — Other Ambulatory Visit: Payer: Self-pay | Admitting: Family Medicine

## 2014-10-03 ENCOUNTER — Encounter: Payer: Self-pay | Admitting: Family Medicine

## 2014-10-03 ENCOUNTER — Ambulatory Visit (INDEPENDENT_AMBULATORY_CARE_PROVIDER_SITE_OTHER): Payer: Commercial Managed Care - HMO | Admitting: Family Medicine

## 2014-10-03 VITALS — BP 162/74 | HR 60 | Wt 130.0 lb

## 2014-10-03 DIAGNOSIS — N390 Urinary tract infection, site not specified: Secondary | ICD-10-CM

## 2014-10-03 DIAGNOSIS — D72829 Elevated white blood cell count, unspecified: Secondary | ICD-10-CM

## 2014-10-03 DIAGNOSIS — R32 Unspecified urinary incontinence: Secondary | ICD-10-CM

## 2014-10-03 MED ORDER — SULFAMETHOXAZOLE-TRIMETHOPRIM 800-160 MG PO TABS: 1.0000 | ORAL_TABLET | Freq: Two times a day (BID) | ORAL | Status: DC

## 2014-10-03 NOTE — Progress Notes (Signed)
   Subjective:    Patient ID: Rachel Keith, female    DOB: Mar 02, 1941, 74 y.o.   MRN: [id number]  HPI [facility] after started having some incontinence the 4 weeks prior.. She has been having some incontinence when she stands. She had low grade fever and dec appetite x 5 weeks, though hasn't lost any weight.   She was diagnosed with urinary tract infection. Urine culture was positive for Klebsiella. She was started on keflex. Should have 3 more days of antibiotic. No dysuria.She just feels sleepy all the time.    No longer using Fiorinal for her HA.    Review of Systems     Objective:   Physical Exam  Constitutional: She is oriented to person, place, and time. She appears well-developed and well-nourished.  HENT:  Head: Normocephalic and atraumatic.  Right Ear: External ear normal.  Left Ear: External ear normal.  Nose: Nose normal.  Mouth/Throat: Oropharynx is clear and moist.  TMs and canals are clear.   Eyes: Conjunctivae and EOM are normal. Pupils are equal, round, and reactive to light.  Neck: Neck supple. No thyromegaly present.  Cardiovascular: Normal rate, regular rhythm and normal heart sounds.   Pulmonary/Chest: Effort normal and breath sounds normal. She has no wheezes.  Lymphadenopathy:    She has no cervical adenopathy.  Neurological: She is alert and oriented to person, place, and time.  Skin: Skin is warm and dry.  Psychiatric: She has a normal mood and affect.          Assessment & Plan:  UTI, klebsiella - Will change to Bactrim since has been on the keflex for one week and not really feeling any better. culture was sensitive Repeat CBC to make sure WBC are trending down, it was 16 in the hospital.   Generalized weakness - Work on getting some nutrition in to help with stregnth and energy   incontinence-suspect most likely from urinary tract infection. Hopefully clearing that up will Inc. Improve the incontinence. When she has  completed the enema biotic Alona see her back and repeat a urinalysis and possible culture as well. If we are able to clear the infection but being Commons persists then consider further evaluation by urology just because of the abrupt onset in nature of her symptoms. It doesn't strike me as typical overactive bladder or stress incontinence. Also consider overflow incontinence.    Repeat CBC

## 2014-10-04 LAB — CBC WITH DIFFERENTIAL/PLATELET
BASOS PCT: 1 % (ref 0–1)
Basophils Absolute: 0.1 10*3/uL (ref 0.0–0.1)
EOS ABS: 0.2 10*3/uL (ref 0.0–0.7)
Eosinophils Relative: 3 % (ref 0–5)
HCT: 36.4 % (ref 36.0–46.0)
Hemoglobin: 12.1 g/dL (ref 12.0–15.0)
LYMPHS ABS: 2.4 10*3/uL (ref 0.7–4.0)
Lymphocytes Relative: 33 % (ref 12–46)
MCH: 30.9 pg (ref 26.0–34.0)
MCHC: 33.2 g/dL (ref 30.0–36.0)
MCV: 93.1 fL (ref 78.0–100.0)
MONO ABS: 0.6 10*3/uL (ref 0.1–1.0)
MONOS PCT: 8 % (ref 3–12)
MPV: 10.3 fL (ref 8.6–12.4)
Neutro Abs: 4 10*3/uL (ref 1.7–7.7)
Neutrophils Relative %: 55 % (ref 43–77)
PLATELETS: 327 10*3/uL (ref 150–400)
RBC: 3.91 MIL/uL (ref 3.87–5.11)
RDW: 13.6 % (ref 11.5–15.5)
WBC: 7.3 10*3/uL (ref 4.0–10.5)

## 2014-10-18 ENCOUNTER — Ambulatory Visit (INDEPENDENT_AMBULATORY_CARE_PROVIDER_SITE_OTHER): Payer: Commercial Managed Care - HMO | Admitting: Family Medicine

## 2014-10-18 ENCOUNTER — Encounter: Payer: Self-pay | Admitting: Family Medicine

## 2014-10-18 VITALS — BP 128/68 | HR 56 | Temp 97.6°F | Wt 133.0 lb

## 2014-10-18 DIAGNOSIS — L298 Other pruritus: Secondary | ICD-10-CM | POA: Diagnosis not present

## 2014-10-18 DIAGNOSIS — R300 Dysuria: Secondary | ICD-10-CM

## 2014-10-18 DIAGNOSIS — R319 Hematuria, unspecified: Secondary | ICD-10-CM | POA: Diagnosis not present

## 2014-10-18 DIAGNOSIS — N898 Other specified noninflammatory disorders of vagina: Secondary | ICD-10-CM

## 2014-10-18 DIAGNOSIS — R32 Unspecified urinary incontinence: Secondary | ICD-10-CM

## 2014-10-18 DIAGNOSIS — N393 Stress incontinence (female) (male): Secondary | ICD-10-CM

## 2014-10-18 LAB — WET PREP FOR TRICH, YEAST, CLUE: Trich, Wet Prep: NONE SEEN

## 2014-10-18 LAB — POCT URINALYSIS DIPSTICK
Bilirubin, UA: NEGATIVE
Glucose, UA: NEGATIVE
Ketones, UA: NEGATIVE
Nitrite, UA: NEGATIVE
PH UA: 5.5
Protein, UA: 30
Spec Grav, UA: 1.02
Urobilinogen, UA: 0.2

## 2014-10-18 MED ORDER — SULFAMETHOXAZOLE-TRIMETHOPRIM 800-160 MG PO TABS: 1.0000 | ORAL_TABLET | Freq: Two times a day (BID) | ORAL | Status: DC

## 2014-10-18 NOTE — Progress Notes (Signed)
   Subjective:    Patient ID: Rachel Keith, female    DOB: Apr 21, 1941, 74 y.o.   MRN: [id number]  HPI She is still having vaginal itching.  At night can't hold her urine.  No rash. She is still leaking some urine but not as bad as it was.  Still waking up every 2 hours at night to urinate. She says she's really not getting any urge to go she literally just stands up and can feel the urine going down her leg. She is not had any abdominal pain or back pain with it. No fevers chills or sweats. The fevers have resolved..   Follow-up fatigue-She feels the fatigue is better now that she is eating better.    F/u UTI- Says she took the Bactrim TID and then ran out early.  Fever and de appetite are better. She was on keflex from ED but not getting better so we had switched her to bactrim.    Review of Systems     Objective:   Physical Exam  Constitutional: She is oriented to person, place, and time. She appears well-developed and well-nourished.  HENT:  Head: Normocephalic and atraumatic.  Eyes: Conjunctivae and EOM are normal.  Cardiovascular: Normal rate.   Pulmonary/Chest: Effort normal.  Neurological: She is alert and oriented to person, place, and time.  Skin: Skin is dry. No pallor.  Psychiatric: She has a normal mood and affect. Her behavior is normal.          Assessment & Plan:  UTI - much improved but sxs not resolved.  Significantly better. Appetite is back. She's no longer afebrile and looks much better today. I reminded her that she is only to take prescriptions as prescribed and not to increase doses all medications without consulting with us first. Since she is still having a little bit of irritation with voiding at did go ahead and refill the Bactrim but reminded her to take it twice a day. We are going to send her urinalysis for culture since still has some trace leukocytes and blood. I just want to confirm that we have completely cleared the infection up. We should have an  answer before the weekend and can give her a call back.  Incontinence-if this doesn't completely resolve after we clear the infection then I recommend referral to urology. It's really not urgent incontinence because she is not getting any signal from her bladder that she needs to go. She literally stands up and then wets her pants.  Vaginitis - will do wet prep to to eval for yeast.

## 2014-10-19 MED ORDER — FLUCONAZOLE 150 MG PO TABS: 150.0000 mg | ORAL_TABLET | Freq: Once | ORAL | Status: DC

## 2014-10-19 MED ORDER — METRONIDAZOLE 500 MG PO TABS: 500.0000 mg | ORAL_TABLET | Freq: Two times a day (BID) | ORAL | Status: DC

## 2014-10-19 NOTE — Addendum Note (Signed)
Addended by: Nani Gasser D on: 10/19/2014 07:51 AM   Modules accepted: Orders

## 2014-10-21 LAB — URINE CULTURE: Colony Count: 30000

## 2014-10-23 ENCOUNTER — Other Ambulatory Visit: Payer: Self-pay | Admitting: *Deleted

## 2014-10-23 MED ORDER — LEVOFLOXACIN 250 MG PO TABS: 250.0000 mg | ORAL_TABLET | Freq: Every day | ORAL | Status: DC

## 2014-10-23 MED ORDER — METRONIDAZOLE 500 MG PO TABS: 500.0000 mg | ORAL_TABLET | Freq: Two times a day (BID) | ORAL | Status: DC

## 2014-10-23 MED ORDER — FLUCONAZOLE 150 MG PO TABS: 150.0000 mg | ORAL_TABLET | Freq: Once | ORAL | Status: DC

## 2014-10-23 NOTE — Addendum Note (Signed)
Addended by: Nani Gasser D on: 10/23/2014 07:57 AM   Modules accepted: Orders

## 2014-10-31 ENCOUNTER — Encounter: Payer: Self-pay | Admitting: Family Medicine

## 2014-10-31 ENCOUNTER — Ambulatory Visit (INDEPENDENT_AMBULATORY_CARE_PROVIDER_SITE_OTHER): Payer: Commercial Managed Care - HMO | Admitting: Family Medicine

## 2014-10-31 DIAGNOSIS — R300 Dysuria: Secondary | ICD-10-CM | POA: Diagnosis not present

## 2014-10-31 DIAGNOSIS — R319 Hematuria, unspecified: Secondary | ICD-10-CM

## 2014-10-31 LAB — POCT URINALYSIS DIPSTICK
BILIRUBIN UA: NEGATIVE
Glucose, UA: NEGATIVE
KETONES UA: NEGATIVE
Nitrite, UA: NEGATIVE
PH UA: 5.5
PROTEIN UA: 30
SPEC GRAV UA: 1.015
Urobilinogen, UA: 0.2

## 2014-10-31 NOTE — Progress Notes (Signed)
   Subjective:    Patient ID: Rachel Keith, female    DOB: November 06, 1940, 74 y.o.   MRN: [id number]  HPI Pt was here today to give a urine sample for POCT UA for dysuria. She is on her last metroNIDAZOLE (FLAGYL) 500 MG tablet for BV.       Review of Systems     Objective:   Physical Exam        Assessment & Plan:  UA shows trace leuk and blood sow will send for cultlure.   Nani Gasser, MD

## 2014-10-31 NOTE — Addendum Note (Signed)
Addended by: Deno Etienne on: 10/31/2014 03:57 PM   Modules accepted: Orders

## 2014-11-01 LAB — URINE CULTURE
Colony Count: NO GROWTH
Organism ID, Bacteria: NO GROWTH

## 2014-11-27 ENCOUNTER — Telehealth: Payer: Self-pay

## 2014-11-27 MED ORDER — MIRABEGRON ER 25 MG PO TB24: 25.0000 mg | ORAL_TABLET | Freq: Every day | ORAL | Status: DC

## 2014-11-27 NOTE — Telephone Encounter (Signed)
Rachel Keith complains of frequent urination and some leakage. I did advise her that Dr Linford ArnoldMetheney recommended a urology referral. She would like to start with a medication first. Please advise.

## 2014-11-27 NOTE — Telephone Encounter (Signed)
Patient advised of recommendations.  

## 2014-11-27 NOTE — Telephone Encounter (Signed)
Patient advised. She is going to urology on Thursday.

## 2014-11-27 NOTE — Telephone Encounter (Signed)
We'll try a low dose of Myrbetrek. Prescription sent to the pharmacy. Still encouraged her to consider urology referral.

## 2014-11-29 ENCOUNTER — Telehealth: Payer: Self-pay | Admitting: Family Medicine

## 2014-11-29 MED ORDER — TOLTERODINE TARTRATE ER 2 MG PO CP24: 2.0000 mg | ORAL_CAPSULE | Freq: Every day | ORAL | Status: DC

## 2014-11-29 NOTE — Telephone Encounter (Signed)
Pt informed.Rachel Keith  

## 2014-11-29 NOTE — Telephone Encounter (Signed)
Call patient: Please let her know that the Myrbetriq we sent over for her bladder is not going to be covered by her insurance so we are going to send over prescription for Detrol LA today.

## 2014-12-14 ENCOUNTER — Telehealth: Payer: Self-pay | Admitting: *Deleted

## 2014-12-14 DIAGNOSIS — E039 Hypothyroidism, unspecified: Secondary | ICD-10-CM

## 2014-12-14 NOTE — Telephone Encounter (Signed)
TSH ordered.

## 2014-12-15 LAB — TSH: TSH: 1.111 u[IU]/mL (ref 0.350–4.500)

## 2014-12-25 ENCOUNTER — Other Ambulatory Visit: Payer: Self-pay | Admitting: Family Medicine

## 2014-12-26 ENCOUNTER — Other Ambulatory Visit: Payer: Self-pay | Admitting: Family Medicine

## 2014-12-27 ENCOUNTER — Encounter: Payer: Self-pay | Admitting: Family Medicine

## 2014-12-27 ENCOUNTER — Ambulatory Visit (INDEPENDENT_AMBULATORY_CARE_PROVIDER_SITE_OTHER): Payer: Commercial Managed Care - HMO | Admitting: Family Medicine

## 2014-12-27 VITALS — BP 131/62 | HR 62 | Wt 125.0 lb

## 2014-12-27 DIAGNOSIS — R32 Unspecified urinary incontinence: Secondary | ICD-10-CM

## 2014-12-27 DIAGNOSIS — I10 Essential (primary) hypertension: Secondary | ICD-10-CM

## 2014-12-27 DIAGNOSIS — N393 Stress incontinence (female) (male): Secondary | ICD-10-CM

## 2014-12-27 DIAGNOSIS — R630 Anorexia: Secondary | ICD-10-CM | POA: Diagnosis not present

## 2014-12-27 DIAGNOSIS — F430 Acute stress reaction: Secondary | ICD-10-CM
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
## 2014-12-29 ENCOUNTER — Other Ambulatory Visit: Payer: Self-pay | Admitting: Family Medicine

## 2015-01-01 ENCOUNTER — Ambulatory Visit (INDEPENDENT_AMBULATORY_CARE_PROVIDER_SITE_OTHER): Payer: Commercial Managed Care - HMO | Admitting: Family Medicine

## 2015-01-01 ENCOUNTER — Encounter: Payer: Self-pay | Admitting: Family Medicine

## 2015-01-01 VITALS — BP 128/68 | HR 63 | Wt 125.0 lb

## 2015-01-01 DIAGNOSIS — R404 Transient alteration of awareness: Secondary | ICD-10-CM

## 2015-01-01 DIAGNOSIS — R413 Other amnesia: Secondary | ICD-10-CM

## 2015-01-01 DIAGNOSIS — R42 Dizziness and giddiness: Secondary | ICD-10-CM

## 2015-01-01 DIAGNOSIS — R4020 Unspecified coma: Secondary | ICD-10-CM

## 2015-01-01 DIAGNOSIS — R2681 Unsteadiness on feet: Secondary | ICD-10-CM
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
## 2015-01-05 LAB — CBC WITH DIFFERENTIAL/PLATELET
Basophils Absolute: 0.1 10*3/uL (ref 0.0–0.1)
Basophils Relative: 1 % (ref 0–1)
EOS PCT: 2 % (ref 0–5)
Eosinophils Absolute: 0.1 10*3/uL (ref 0.0–0.7)
HEMATOCRIT: 39 % (ref 36.0–46.0)
Hemoglobin: 12.6 g/dL (ref 12.0–15.0)
LYMPHS PCT: 29 % (ref 12–46)
Lymphs Abs: 2.1 10*3/uL (ref 0.7–4.0)
MCH: 30.3 pg (ref 26.0–34.0)
MCHC: 32.3 g/dL (ref 30.0–36.0)
MCV: 93.8 fL (ref 78.0–100.0)
MONO ABS: 0.5 10*3/uL (ref 0.1–1.0)
MPV: 9.8 fL (ref 8.6–12.4)
Monocytes Relative: 7 % (ref 3–12)
Neutro Abs: 4.3 10*3/uL (ref 1.7–7.7)
Neutrophils Relative %: 61 % (ref 43–77)
Platelets: 293 10*3/uL (ref 150–400)
RBC: 4.16 MIL/uL (ref 3.87–5.11)
RDW: 13.7 % (ref 11.5–15.5)
WBC: 7.1 10*3/uL (ref 4.0–10.5)

## 2015-01-05 LAB — COMPLETE METABOLIC PANEL WITH GFR
ALT: 10 U/L (ref 6–29)
AST: 15 U/L (ref 10–35)
Albumin: 3.6 g/dL (ref 3.6–5.1)
Alkaline Phosphatase: 75 U/L (ref 33–130)
BUN: 13 mg/dL (ref 7–25)
CALCIUM: 9.7 mg/dL (ref 8.6–10.4)
CHLORIDE: 106 mmol/L (ref 98–110)
CO2: 28 mmol/L (ref 20–31)
Creat: 1.04 mg/dL — ABNORMAL HIGH (ref 0.60–0.93)
GFR, EST AFRICAN AMERICAN: 61 mL/min (ref >=60)
GFR, EST NON AFRICAN AMERICAN: 53 mL/min — AB (ref >=60)
Glucose, Bld: 119 mg/dL — ABNORMAL HIGH (ref 65–99)
POTASSIUM: 4.1 mmol/L (ref 3.5–5.3)
Sodium: 144 mmol/L (ref 135–146)
Total Bilirubin: 0.3 mg/dL (ref 0.2–1.2)
Total Protein: 7 g/dL (ref 6.1–8.1)

## 2015-01-05 LAB — MAGNESIUM: MAGNESIUM: 1.9 mg/dL (ref 1.5–2.5)

## 2015-01-05 LAB — FERRITIN: FERRITIN: 196 ng/mL (ref 10–291)

## 2015-01-05 LAB — VITAMIN B12: VITAMIN B 12: 786 pg/mL (ref 211–911)

## 2015-01-05 LAB — RPR

## 2015-01-13 ENCOUNTER — Ambulatory Visit (HOSPITAL_BASED_OUTPATIENT_CLINIC_OR_DEPARTMENT_OTHER)
Admission: RE | Admit: 2015-01-13 | Discharge: 2015-01-13 | Disposition: A | Discharge status: Home / Self Care | Payer: Commercial Managed Care - HMO | Source: Ambulatory Visit | Attending: Family Medicine | Admitting: Family Medicine

## 2015-01-13 DIAGNOSIS — I517 Cardiomegaly: Secondary | ICD-10-CM | POA: Diagnosis not present

## 2015-01-13 DIAGNOSIS — R269 Unspecified abnormalities of gait and mobility: Secondary | ICD-10-CM | POA: Insufficient documentation

## 2015-01-13 DIAGNOSIS — R55 Syncope and collapse: Secondary | ICD-10-CM | POA: Insufficient documentation

## 2015-01-13 DIAGNOSIS — R413 Other amnesia: Secondary | ICD-10-CM

## 2015-01-13 DIAGNOSIS — R4020 Unspecified coma: Secondary | ICD-10-CM

## 2015-01-13 DIAGNOSIS — R42 Dizziness and giddiness: Secondary | ICD-10-CM | POA: Insufficient documentation

## 2015-01-13 DIAGNOSIS — R2681 Unsteadiness on feet: Secondary | ICD-10-CM

## 2015-01-13 MED ORDER — GADOBENATE DIMEGLUMINE 529 MG/ML IV SOLN: 11.0000 mL | Freq: Once | INTRAVENOUS | Status: DC | PRN

## 2015-01-26 ENCOUNTER — Other Ambulatory Visit: Payer: Self-pay | Admitting: Family Medicine

## 2015-04-06 ENCOUNTER — Other Ambulatory Visit: Payer: Self-pay | Admitting: Family Medicine

## 2015-04-12 ENCOUNTER — Other Ambulatory Visit: Payer: Self-pay

## 2015-04-12 ENCOUNTER — Other Ambulatory Visit: Payer: Self-pay | Admitting: Family Medicine

## 2015-04-12 MED ORDER — ATENOLOL 50 MG PO TABS: ORAL_TABLET | ORAL | Status: DC

## 2015-04-23 ENCOUNTER — Encounter: Payer: Self-pay | Admitting: Family Medicine

## 2015-04-23 ENCOUNTER — Ambulatory Visit (INDEPENDENT_AMBULATORY_CARE_PROVIDER_SITE_OTHER): Payer: Commercial Managed Care - HMO | Admitting: Family Medicine

## 2015-04-23 VITALS — BP 140/63 | HR 44 | Temp 97.5°F | Ht 61.75 in | Wt 130.0 lb

## 2015-04-23 DIAGNOSIS — N3000 Acute cystitis without hematuria: Secondary | ICD-10-CM

## 2015-04-23 DIAGNOSIS — R32 Unspecified urinary incontinence: Secondary | ICD-10-CM

## 2015-04-23 DIAGNOSIS — R350 Frequency of micturition: Secondary | ICD-10-CM | POA: Diagnosis not present

## 2015-04-23 DIAGNOSIS — I10 Essential (primary) hypertension: Secondary | ICD-10-CM

## 2015-04-23 DIAGNOSIS — G4700 Insomnia, unspecified: Secondary | ICD-10-CM

## 2015-04-23 DIAGNOSIS — F419 Anxiety disorder, unspecified: Secondary | ICD-10-CM

## 2015-04-23 LAB — POCT URINALYSIS DIPSTICK
Bilirubin, UA: NEGATIVE
Glucose, UA: NEGATIVE
KETONES UA: NEGATIVE
Nitrite, UA: NEGATIVE
PH UA: 7
SPEC GRAV UA: 1.015
UROBILINOGEN UA: 0.2

## 2015-04-23 MED ORDER — CIPROFLOXACIN HCL 500 MG PO TABS: 500.0000 mg | ORAL_TABLET | Freq: Two times a day (BID) | ORAL | Status: AC

## 2015-04-23 MED ORDER — TRAZODONE HCL 50 MG PO TABS: 25.0000 mg | ORAL_TABLET | Freq: Every day | ORAL | Status: DC

## 2015-04-23 NOTE — Progress Notes (Signed)
   Subjective:    Patient ID: Rachel Keith, female    DOB: [birth year], 74 y.o.   MRN: [id number]  HPI Hypertension- Pt denies chest pain, SOB, dizziness, or heart palpitations.  Taking meds as directed w/o problems.  Denies medication side effects.    She is feeling stressed and overwhelmed.  Having some difficulty with her sons and is helping take care of a 75 yo friend.  She is not sleeping well at all. ays has been taking more of her valium recently.   Incontinence -she did see the urologist but says they just tried her more medication. She really feels like she may need another bladder tack. She had one done a little over 20 years ago. she would like to see Dr. Rocky MorelGretchin Adkins for this at [facility].  She has been having more urinary sxs and lately. In fact she's starting to have more brisk incontinence like she was 3 or 4 months ago when we treated her for UTI. No hematuria. No dysuria. Would like a "refill on the antibiotic".     Review of Systems     Objective:   Physical Exam  Constitutional: She is oriented to person, place, and time. She appears well-developed and well-nourished.  HENT:  Head: Normocephalic and atraumatic.  Cardiovascular: Normal rate, regular rhythm and normal heart sounds.   Pulmonary/Chest: Effort normal and breath sounds normal.  Neurological: She is alert and oriented to person, place, and time.  Skin: Skin is warm and dry.  Psychiatric: She has a normal mood and affect. Her behavior is normal.          Assessment & Plan:  HTN - repeat blood pressure much better. She seems very anxious today. We continue to monitor carefully.  Incontinence, urinary - -will refer to GYN for further treatment and evaluation.  UTI-we'll treat with Cipro. She also wants another course of metronidazole. Explained that she can use metronidazole after she completes the Cipro.  Insomnia-recommend a trial of trazodone start with one tablet can go up to 2 tabs if  needed. May also help with mood somewhat as well.

## 2015-04-25 LAB — URINE CULTURE
Colony Count: NO GROWTH
Organism ID, Bacteria: NO GROWTH

## 2015-04-25 MED ORDER — METRONIDAZOLE 500 MG PO TABS: 500.0000 mg | ORAL_TABLET | Freq: Two times a day (BID) | ORAL | Status: DC

## 2015-04-25 NOTE — Addendum Note (Signed)
Addended by: Nani GasserMETHENEY, Dmonte Maher D on: 04/25/2015 02:48 PM   Modules accepted: Orders

## 2015-04-25 NOTE — Progress Notes (Signed)
   Subjective:    Patient ID: Rachel Keith, female    DOB: Oct 05, 1940, 74 y.o.   MRN: [id number]  HPI    Review of Systems     Objective:   Physical Exam        Assessment & Plan:

## 2015-05-09 ENCOUNTER — Other Ambulatory Visit: Payer: Self-pay

## 2015-05-09 ENCOUNTER — Other Ambulatory Visit: Payer: Self-pay | Admitting: Family Medicine

## 2015-05-09 MED ORDER — BENAZEPRIL HCL 40 MG PO TABS: ORAL_TABLET | ORAL | Status: DC

## 2015-05-09 MED ORDER — ATENOLOL 50 MG PO TABS: ORAL_TABLET | ORAL | Status: DC

## 2015-05-13 HISTORY — PX: CARDIOVERSION: SHX1299

## 2015-05-28 ENCOUNTER — Inpatient Hospital Stay (HOSPITAL_COMMUNITY)
Admission: EM | Admit: 2015-05-28 | Discharge: 2015-05-30 | DRG: 309 | Disposition: A | Discharge status: Home / Self Care | Payer: Commercial Managed Care - HMO | Attending: Internal Medicine | Admitting: Internal Medicine

## 2015-05-28 ENCOUNTER — Emergency Department (HOSPITAL_COMMUNITY): Payer: Commercial Managed Care - HMO

## 2015-05-28 ENCOUNTER — Encounter (HOSPITAL_COMMUNITY): Payer: Self-pay | Admitting: *Deleted

## 2015-05-28 ENCOUNTER — Encounter: Payer: Self-pay | Admitting: Family Medicine

## 2015-05-28 ENCOUNTER — Ambulatory Visit (INDEPENDENT_AMBULATORY_CARE_PROVIDER_SITE_OTHER): Payer: Commercial Managed Care - HMO | Admitting: Family Medicine

## 2015-05-28 VITALS — BP 146/90 | HR 123 | Wt 129.0 lb

## 2015-05-28 DIAGNOSIS — E039 Hypothyroidism, unspecified: Secondary | ICD-10-CM | POA: Diagnosis present

## 2015-05-28 DIAGNOSIS — I509 Heart failure, unspecified: Secondary | ICD-10-CM | POA: Diagnosis present

## 2015-05-28 DIAGNOSIS — R0600 Dyspnea, unspecified: Secondary | ICD-10-CM | POA: Diagnosis not present

## 2015-05-28 DIAGNOSIS — G4700 Insomnia, unspecified: Secondary | ICD-10-CM | POA: Diagnosis present

## 2015-05-28 DIAGNOSIS — I130 Hypertensive heart and chronic kidney disease with heart failure and stage 1 through stage 4 chronic kidney disease, or unspecified chronic kidney disease: Secondary | ICD-10-CM | POA: Diagnosis present

## 2015-05-28 DIAGNOSIS — Z79899 Other long term (current) drug therapy: Secondary | ICD-10-CM | POA: Diagnosis not present

## 2015-05-28 DIAGNOSIS — I272 Other secondary pulmonary hypertension: Secondary | ICD-10-CM | POA: Diagnosis present

## 2015-05-28 DIAGNOSIS — I10 Essential (primary) hypertension: Secondary | ICD-10-CM | POA: Diagnosis present

## 2015-05-28 DIAGNOSIS — I481 Persistent atrial fibrillation: Secondary | ICD-10-CM | POA: Diagnosis not present

## 2015-05-28 DIAGNOSIS — I083 Combined rheumatic disorders of mitral, aortic and tricuspid valves: Secondary | ICD-10-CM | POA: Diagnosis present

## 2015-05-28 DIAGNOSIS — N189 Chronic kidney disease, unspecified: Secondary | ICD-10-CM | POA: Diagnosis not present

## 2015-05-28 DIAGNOSIS — I4891 Unspecified atrial fibrillation: Secondary | ICD-10-CM | POA: Diagnosis present

## 2015-05-28 DIAGNOSIS — R0602 Shortness of breath: Secondary | ICD-10-CM | POA: Diagnosis present

## 2015-05-28 DIAGNOSIS — I4819 Other persistent atrial fibrillation: Secondary | ICD-10-CM

## 2015-05-28 DIAGNOSIS — F419 Anxiety disorder, unspecified: Secondary | ICD-10-CM | POA: Diagnosis present

## 2015-05-28 DIAGNOSIS — N183 Chronic kidney disease, stage 3 unspecified: Secondary | ICD-10-CM | POA: Diagnosis present

## 2015-05-28 DIAGNOSIS — I340 Nonrheumatic mitral (valve) insufficiency: Secondary | ICD-10-CM | POA: Diagnosis present

## 2015-05-28 DIAGNOSIS — I071 Rheumatic tricuspid insufficiency: Secondary | ICD-10-CM | POA: Diagnosis present

## 2015-05-28 LAB — TSH: TSH: 2.35 u[IU]/mL (ref 0.350–4.500)

## 2015-05-28 LAB — BASIC METABOLIC PANEL
ANION GAP: 13 (ref 5–15)
BUN: 17 mg/dL (ref 6–20)
CO2: 23 mmol/L (ref 22–32)
Calcium: 10 mg/dL (ref 8.9–10.3)
Chloride: 109 mmol/L (ref 101–111)
Creatinine, Ser: 1.24 mg/dL — ABNORMAL HIGH (ref 0.44–1.00)
GFR calc Af Amer: 48 mL/min — ABNORMAL LOW (ref >60)
GFR, EST NON AFRICAN AMERICAN: 41 mL/min — AB (ref >60)
GLUCOSE: 119 mg/dL — AB (ref 65–99)
Potassium: 4.5 mmol/L (ref 3.5–5.1)
Sodium: 145 mmol/L (ref 135–145)

## 2015-05-28 LAB — MAGNESIUM: MAGNESIUM: 2.1 mg/dL (ref 1.7–2.4)

## 2015-05-28 LAB — CBC
HEMATOCRIT: 47.4 % — AB (ref 36.0–46.0)
HEMOGLOBIN: 15.7 g/dL — AB (ref 12.0–15.0)
MCH: 32 pg (ref 26.0–34.0)
MCHC: 33.1 g/dL (ref 30.0–36.0)
MCV: 96.7 fL (ref 78.0–100.0)
Platelets: 147 10*3/uL — ABNORMAL LOW (ref 150–400)
RBC: 4.9 MIL/uL (ref 3.87–5.11)
RDW: 15.2 % (ref 11.5–15.5)
WBC: 13.5 10*3/uL — ABNORMAL HIGH (ref 4.0–10.5)

## 2015-05-28 LAB — TROPONIN I

## 2015-05-28 LAB — PROTIME-INR
INR: 1.18 (ref 0.00–1.49)
Prothrombin Time: 15.1 seconds (ref 11.6–15.2)

## 2015-05-28 MED ORDER — FLUTICASONE PROPIONATE 50 MCG/ACT NA SUSP
1.0000 | Freq: Every day | NASAL | Status: DC
  Filled 2015-05-28 (×2): qty 16

## 2015-05-28 MED ORDER — LEVALBUTEROL HCL 0.63 MG/3ML IN NEBU: 0.6300 mg | INHALATION_SOLUTION | Freq: Four times a day (QID) | RESPIRATORY_TRACT | Status: DC | PRN

## 2015-05-28 MED ORDER — ACETAMINOPHEN 325 MG PO TABS: 650.0000 mg | ORAL_TABLET | Freq: Four times a day (QID) | ORAL | Status: DC | PRN

## 2015-05-28 MED ORDER — SODIUM CHLORIDE 0.9 % IJ SOLN
3.0000 mL | Freq: Two times a day (BID) | INTRAMUSCULAR | Status: DC
  Administered 2015-05-29 – 2015-05-30 (×2): 3 mL via INTRAVENOUS

## 2015-05-28 MED ORDER — BENAZEPRIL HCL 20 MG PO TABS
40.0000 mg | ORAL_TABLET | Freq: Every day | ORAL | Status: DC
  Administered 2015-05-29 – 2015-05-30 (×2): 40 mg via ORAL
  Filled 2015-05-28 (×2): qty 2

## 2015-05-28 MED ORDER — DILTIAZEM HCL 100 MG IV SOLR
5.0000 mg/h | INTRAVENOUS | Status: DC
  Administered 2015-05-28 (×2): 5 mg/h via INTRAVENOUS
  Administered 2015-05-29: 7.5 mg/h via INTRAVENOUS
  Filled 2015-05-28 (×3): qty 100

## 2015-05-28 MED ORDER — DICLOFENAC SODIUM 1 % TD GEL
2.0000 g | Freq: Four times a day (QID) | TRANSDERMAL | Status: DC
  Filled 2015-05-28: qty 100

## 2015-05-28 MED ORDER — ENOXAPARIN SODIUM 60 MG/0.6ML ~~LOC~~ SOLN
60.0000 mg | Freq: Two times a day (BID) | SUBCUTANEOUS | Status: DC
  Administered 2015-05-29 – 2015-05-30 (×4): 60 mg via SUBCUTANEOUS
  Filled 2015-05-28 (×4): qty 0.6

## 2015-05-28 MED ORDER — ACETAMINOPHEN 650 MG RE SUPP: 650.0000 mg | Freq: Four times a day (QID) | RECTAL | Status: DC | PRN

## 2015-05-28 MED ORDER — TRAZODONE HCL 50 MG PO TABS
25.0000 mg | ORAL_TABLET | Freq: Every day | ORAL | Status: DC
  Administered 2015-05-29: 50 mg via ORAL
  Filled 2015-05-28: qty 1

## 2015-05-28 MED ORDER — DILTIAZEM LOAD VIA INFUSION
10.0000 mg | Freq: Once | INTRAVENOUS | Status: AC
  Administered 2015-05-28: 10 mg via INTRAVENOUS
  Filled 2015-05-28: qty 10

## 2015-05-28 MED ORDER — LEVOTHYROXINE SODIUM 75 MCG PO TABS
150.0000 ug | ORAL_TABLET | Freq: Every day | ORAL | Status: DC
  Administered 2015-05-29 – 2015-05-30 (×2): 150 ug via ORAL
  Filled 2015-05-28: qty 6
  Filled 2015-05-28: qty 2

## 2015-05-28 MED ORDER — DIAZEPAM 5 MG PO TABS
10.0000 mg | ORAL_TABLET | Freq: Every day | ORAL | Status: DC | PRN
  Administered 2015-05-29: 10 mg via ORAL
  Filled 2015-05-28: qty 2

## 2015-05-28 MED ORDER — LEVALBUTEROL HCL 0.63 MG/3ML IN NEBU
0.6300 mg | INHALATION_SOLUTION | RESPIRATORY_TRACT | Status: AC
  Administered 2015-05-28: 0.63 mg via RESPIRATORY_TRACT
  Filled 2015-05-28 (×2): qty 3

## 2015-05-28 MED ORDER — ONDANSETRON HCL 4 MG PO TABS: 4.0000 mg | ORAL_TABLET | Freq: Four times a day (QID) | ORAL | Status: DC | PRN

## 2015-05-28 MED ORDER — FUROSEMIDE 10 MG/ML IJ SOLN: 10.0000 mg | Freq: Once | INTRAMUSCULAR | Status: DC

## 2015-05-28 MED ORDER — ONDANSETRON HCL 4 MG/2ML IJ SOLN: 4.0000 mg | Freq: Four times a day (QID) | INTRAMUSCULAR | Status: DC | PRN
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
## 2015-05-28 NOTE — ED Provider Notes (Signed)
CSN: [id number]     Arrival date & time 05/28/15  1738 History   First MD Initiated Contact with Patient 05/28/15 1821     Chief Complaint  Patient presents with  . Shortness of Breath     Patient is a 75 y.o. female presenting with shortness of breath. The history is provided by the patient.  Shortness of Breath Severity:  Moderate Associated symptoms: no abdominal pain, no chest pain, no fever, no headaches, no rash and no vomiting    patient's been short of breath the last few days. Has been able to do a normal activities because of it. She saw her primary care doctor and was diagnosed with new onset A. fib with RVR. ChadsVasc 4. No fevers no cough no swelling or legs. She states she is just been fatigued. History of hypertension.patient had had some recent psychiatric problems and had not been eating much.  Past Medical History  Diagnosis Date  . Hypertension    Past Surgical History  Procedure Laterality Date  . Cataract extraction  09/06/2010  . Abdominal hysterectomy    . Appendectomy    . Tonsillectomy    . Cholecystectomy    . Left ankle surgery    . Finger surgery     Family History  Problem Relation Age of Onset  .      Social History  Substance Use Topics  . Smoking status: Never Smoker   . Smokeless tobacco: None  . Alcohol Use: 0.0 oz/week    0 drink(s) per week     Comment: occasional   OB History    No data available     Review of Systems  Constitutional: Positive for fatigue. Negative for fever, chills, activity change and appetite change.  Eyes: Negative for pain.  Respiratory: Positive for shortness of breath. Negative for chest tightness.   Cardiovascular: Negative for chest pain and leg swelling.  Gastrointestinal: Negative for nausea, vomiting, abdominal pain and diarrhea.  Genitourinary: Negative for flank pain.  Musculoskeletal: Negative for back pain and neck stiffness.  Skin: Negative for rash.  Neurological: Negative for weakness,  numbness and headaches.  Psychiatric/Behavioral: Negative for behavioral problems.      Allergies  Amlodipine and Sulfa antibiotics  Home Medications   Prior to Admission medications   Medication Sig Start Date End Date Taking? Authorizing Provider  AMBULATORY NON FORMULARY MEDICATION T4 09/22/13   Agapito Games, MD  AMBULATORY NON FORMULARY MEDICATION Medication Name: Tretinoin 0.03 % apply a pea size amount to area as directed. [facility] Fax to [phone] 10/21/13   Agapito Games, MD  AMBULATORY NON FORMULARY MEDICATION Medication Name: Estradiol 1.6 mg take 1 capsule by mouth daily Fax to [phone] 08/17/14   Agapito Games, MD  AMBULATORY NON FORMULARY MEDICATION Medication Name: Estriol 3.2 mg SR capsules take 1 capsule by mouth daily Fax to [phone] 08/17/14   Agapito Games, MD  atenolol (TENORMIN) 50 MG tablet TAKE 1 TABLET (50 MG TOTAL) BY MOUTH DAILY. 05/09/15   Agapito Games, MD  BECONASE AQ 42 MCG/SPRAY nasal spray USE 2 SPRAYS IN [facility] NOSTRIL DAILY 03/22/13   Agapito Games, MD  benazepril (LOTENSIN) 40 MG tablet TAKE 1 TABLET (40 MG TOTAL) BY MOUTH DAILY. 05/09/15   Agapito Games, MD  diazepam (VALIUM) 10 MG tablet TAKE 1 TABLET BY MOUTH EVERY DAY AS NEEDED 12/26/14   Agapito Games, MD  diclofenac sodium (VOLTAREN) 1 % GEL Apply 2  g topically 4 (four) times daily.    Historical Provider, MD  levothyroxine (SYNTHROID, LEVOTHROID) 150 MCG tablet Take 1 tablet (150 mcg total) by mouth daily. 08/30/14   Agapito Gamesatherine D Metheney, MD  traZODone (DESYREL) 50 MG tablet Take 0.5-1 tablets (25-50 mg total) by mouth at bedtime. 04/23/15   Agapito Gamesatherine D Metheney, MD   BP 105/78 mmHg  Pulse 129  Temp(Src) 97.3 F (36.3 C) (Oral)  Resp 26  Ht 5' (1.524 m)  Wt 129 lb 10.1 oz (58.8 kg)  BMI 25.32 kg/m2  SpO2 100% Physical Exam  Constitutional: She is oriented to person, place, and time. She appears  well-developed.  HENT:  Head: Normocephalic and atraumatic.  Eyes: Pupils are equal, round, and reactive to light.  Neck: Normal range of motion.  Cardiovascular: Normal heart sounds.   Irregular tachycardia  Pulmonary/Chest: Effort normal and breath sounds normal. No respiratory distress. She has no wheezes.  Abdominal: Soft. Bowel sounds are normal. She exhibits no distension. There is no tenderness.  Musculoskeletal: Normal range of motion.  Neurological: She is alert and oriented to person, place, and time.  Skin: Skin is warm and dry.  Psychiatric: She has a normal mood and affect. Her speech is normal.  Nursing note and vitals reviewed.   ED Course  Procedures (including critical care time) Labs Review Labs Reviewed  BASIC METABOLIC PANEL - Abnormal; Notable for the following:    Glucose, Bld 119 (*)    Creatinine, Ser 1.24 (*)    GFR calc non Af Amer 41 (*)    GFR calc Af Amer 48 (*)    All other components within normal limits  CBC - Abnormal; Notable for the following:    WBC 13.5 (*)    Hemoglobin 15.7 (*)    HCT 47.4 (*)    Platelets 147 (*)    All other components within normal limits  TSH  MAGNESIUM  PROTIME-INR  TROPONIN I    Imaging Review Dg Chest 2 View  05/28/2015  CLINICAL DATA:  75 year old female with shortness of breath for the past week EXAM: CHEST  2 VIEW COMPARISON:  None. FINDINGS: Enlargement of the cardiopericardial silhouette. The lateral view is limited by probable expiratory phase timing. There are bibasilar interstitial opacities slightly more confluent on the left than right. Trace thickening of the fissures most visible on the lateral view. Atherosclerotic calcifications present in the transverse aorta. No pneumothorax. Suspect small left pleural effusion. No acute osseous abnormality. IMPRESSION: 1. Mild bibasilar interstitial prominence more prominent on the left than the right which may reflect atelectasis, early dependent edema, or less  likely bilateral lower lobe infiltrates. Atelectasis is favored. 2. Suspect small layering left pleural effusion. 3. Cardiomegaly. 4. Aortic atherosclerosis. Electronically Signed   By: Malachy MoanHeath  McCullough M.D.   On: 05/28/2015 19:22   I have personally reviewed and evaluated these images and lab results as part of my medical decision-making.   EKG Interpretation   Date/Time:  Monday May 28 2015 18:09:49 EST Ventricular Rate:  129 PR Interval:    QRS Duration: 82 QT Interval:  300 QTC Calculation: 439 R Axis:   52 Text Interpretation:  Atrial fibrillation with rapid ventricular response  Anteroseptal infarct , age undetermined Abnormal ECG Confirmed by  Rubin PayorPICKERING  MD, Harrold DonathNATHAN [phone]([id number]) on 05/28/2015 6:23:26 PM      MDM   Final diagnoses:  None    patietn with new onset afib with RVR. May be dehydrated, but continued tachycardia with IV fluids.  Cardizem drip started. Admit to internal medicine.    Benjiman Core, MD 05/28/15 2102

## 2015-05-28 NOTE — ED Notes (Addendum)
Pt states sob x week with exertion.  Last night pt was unable to lay flat.  Went to her pcp today and EKG showed afib with RVR.  Denies chest pain, nausea or diaphoresis.   HR 129 afib.  Breathing labored.

## 2015-05-28 NOTE — Progress Notes (Addendum)
Subjective:    Patient ID: Rachel Keith, female    DOB: Sep 21, 1940, 75 y.o.   MRN: [id number]  HPI [age] year old female is a history of hypertension who comes in today complaining of shortness of breath. She said she started feeling a little short of breath at the end of last week. It was more intense on Friday, approximately 4 days ago. She feels like it's been slowly progressing. She noticed initially with going upstairs. She said it feels like she's been running a marathon and just feels winded. She has had some back pain but a nice any chest pain or diaphoresis. She's also felt weak. No recent fever or chills or sweats. No recent upper respiratory infection. She denies any swelling or edema. No prior history congestive heart failure. No prior history of pulmonary embolism or DVT. She does feel better at rest. She says last night she was not able to sleep well because she felt so short of breath.   Review of Systems   BP 146/90 mmHg  Pulse 123  Wt 129 lb (58.514 kg)  SpO2 98%    Allergies  Allergen Reactions  . Amlodipine Other (See Comments)    Didn't feel well on 2.5 mg dose.   . Sulfa Antibiotics Rash    Broke out in her joints one time as a teenager    Past Medical History  Diagnosis Date  . Hypertension     Past Surgical History  Procedure Laterality Date  . Cataract extraction  09/06/2010  . Abdominal hysterectomy    . Appendectomy    . Tonsillectomy    . Cholecystectomy    . Left ankle surgery    . Finger surgery      Social History   Social History  . Marital Status: Divorced    Spouse Name: N/A  . Number of Children: 4  . Years of Education: N/A   Occupational History  . retired    Social History Main Topics  . Smoking status: Never Smoker   . Smokeless tobacco: Not on file  . Alcohol Use: 0.0 oz/week    0 drink(s) per week     Comment: occasional  . Drug Use: No  . Sexual Activity: No   Other Topics Concern  . Not on file   Social History  Narrative   1 caffeinated drink daily     Family History  Problem Relation Age of Onset  .       Outpatient Encounter Prescriptions as of 05/28/2015  Medication Sig  . AMBULATORY NON FORMULARY MEDICATION T4 88mcg  . AMBULATORY NON FORMULARY MEDICATION Medication Name: Tretinoin 0.03 % apply a pea size amount to area as directed. [facility] Fax to [phone]  . AMBULATORY NON FORMULARY MEDICATION Medication Name: Estradiol 1.6 mg take 1 capsule by mouth daily Fax to [phone]  . AMBULATORY NON FORMULARY MEDICATION Medication Name: Estriol 3.2 mg SR capsules take 1 capsule by mouth daily Fax to [phone]  . atenolol (TENORMIN) 50 MG tablet TAKE 1 TABLET (50 MG TOTAL) BY MOUTH DAILY.  Marland Kitchen. BECONASE AQ 42 MCG/SPRAY nasal spray USE 2 SPRAYS IN EACH NOSTRIL DAILY  . benazepril (LOTENSIN) 40 MG tablet TAKE 1 TABLET (40 MG TOTAL) BY MOUTH DAILY.  . diazepam (VALIUM) 10 MG tablet TAKE 1 TABLET BY MOUTH EVERY DAY AS NEEDED  . diclofenac sodium (VOLTAREN) 1 % GEL Apply 2 g topically 4 (four) times daily.  Marland Kitchen. levothyroxine (SYNTHROID, LEVOTHROID) 150 MCG tablet Take 1 tablet (  150 mcg total) by mouth daily.  . traZODone (DESYREL) 50 MG tablet Take 0.5-1 tablets (25-50 mg total) by mouth at bedtime.  . [DISCONTINUED] metroNIDAZOLE (FLAGYL) 500 MG tablet Take 1 tablet (500 mg total) by mouth 2 (two) times daily.   No facility-administered encounter medications on file as of 05/28/2015.          Objective:   Physical Exam  Constitutional: She is oriented to person, place, and time. She appears well-developed and well-nourished.  She appears pale.   HENT:  Head: Normocephalic and atraumatic.  Neck: Neck supple. No thyromegaly present.  Cardiovascular: Normal heart sounds.   Tachycardic, irregular rhythm.  Pulmonary/Chest: Effort normal and breath sounds normal.  Musculoskeletal: She exhibits no edema.  Lymphadenopathy:    She has no cervical adenopathy.   Neurological: She is alert and oriented to person, place, and time.  Skin: Skin is warm and dry.  Psychiatric: She has a normal mood and affect. Her behavior is normal.          Assessment & Plan:  New onset atrial fibrillation - CHADVASC2 score of 4 - needs anticoagulation. Because she is so short of breath and symptomatic we recommend that she go to the emergency permit for further evaluation. She became very weak in the office in almost collapsed but was able to get back to the room to sit down. She appears pale and weak on exam. I really think she would benefit from hospitalization to better control heart rate and start her on anticoagulation. Her son was here today and is driving her to the hospital. He does understand the risks of not being evaluated including a blood clot.    Hypertension-uncontrolled but not feeling well today and atrial fibrillation.  EKG shows rate of 140 bpm with irregular rhythm and atrial fibrillation.

## 2015-05-28 NOTE — Progress Notes (Signed)
ANTICOAGULATION CONSULT NOTE - Initial Consult  Pharmacy Consult for Lovenox Indication: atrial fibrillation  Allergies  Allergen Reactions  . Amlodipine Other (See Comments)    Didn't feel well on 2.5 mg dose.   . Sulfa Antibiotics Rash    Broke out in her joints one time as a teenager    Patient Measurements: Height: 5' (152.4 cm) Weight: 129 lb 10.1 oz (58.8 kg) IBW/kg (Calculated) : 45.5  Vital Signs: Temp: 97.3 F (36.3 C) (01/16 1811) Temp Source: Oral (01/16 1811) BP: 147/93 mmHg (01/16 2137) Pulse Rate: 113 (01/16 2137)  Labs:  Recent Labs  05/28/15 1854  HGB 15.7*  HCT 47.4*  PLT 147*  LABPROT 15.1  INR 1.18  CREATININE 1.24*  TROPONINI <0.03    Estimated Creatinine Clearance: 31.4 mL/min (by C-G formula based on Cr of 1.24).   Medical History: Past Medical History  Diagnosis Date  . Hypertension     Medications:  Prescriptions prior to admission  Medication Sig Dispense Refill Last Dose  . atenolol (TENORMIN) 50 MG tablet TAKE 1 TABLET (50 MG TOTAL) BY MOUTH DAILY. 90 tablet 2 05/28/2015 at 0800  . benazepril (LOTENSIN) 40 MG tablet TAKE 1 TABLET (40 MG TOTAL) BY MOUTH DAILY. 90 tablet 2 05/28/2015 at Unknown time  . diazepam (VALIUM) 10 MG tablet TAKE 1 TABLET BY MOUTH EVERY DAY AS NEEDED 90 tablet 0 Past Week at Unknown time  . diclofenac sodium (VOLTAREN) 1 % GEL Apply 2 g topically 4 (four) times daily.   05/28/2015 at Unknown time  . estradiol (ESTRACE) 2 MG tablet Take 2 mg by mouth daily.   05/28/2015 at Unknown time  . levothyroxine (SYNTHROID, LEVOTHROID) 150 MCG tablet Take 1 tablet (150 mcg total) by mouth daily. 90 tablet 0 05/28/2015 at Unknown time  . Multiple Vitamin (MULTIVITAMIN) tablet Take 1 tablet by mouth daily.   05/28/2015 at Unknown time  . MYRBETRIQ 25 MG TB24 tablet Take 25 mg by mouth daily.   05/28/2015 at Unknown time  . traZODone (DESYREL) 50 MG tablet Take 0.5-1 tablets (25-50 mg total) by mouth at bedtime. 30 tablet 3 Past  Week at Unknown time  . AMBULATORY NON FORMULARY MEDICATION T4 (Patient not taking: Reported on 05/28/2015) 30 mcg 0 Not Taking at Unknown time  . AMBULATORY NON FORMULARY MEDICATION Medication Name: Tretinoin 0.03 % apply a pea size amount to area as directed. [facility] Fax to [phone] (Patient not taking: Reported on 05/28/2015) 30 mL 1 Not Taking at Unknown time  . AMBULATORY NON FORMULARY MEDICATION Medication Name: Estradiol 1.6 mg take 1 capsule by mouth daily Fax to [phone] (Patient not taking: Reported on 05/28/2015) 90 capsule 2 Not Taking at Unknown time  . AMBULATORY NON FORMULARY MEDICATION Medication Name: Estriol 3.2 mg SR capsules take 1 capsule by mouth daily Fax to [phone] (Patient not taking: Reported on 05/28/2015) 90 capsule 2 Not Taking at Unknown time   Scheduled:  . [START ON 05/29/2015] benazepril  40 mg Oral Daily  . diclofenac sodium  2 g Topical QID  . enoxaparin (LOVENOX) injection  60 mg Subcutaneous Q12H  . [START ON 05/29/2015] fluticasone  1 spray Each Nare Daily  . furosemide  10 mg Intravenous Once  . levalbuterol  0.63 mg Nebulization STAT  . [START ON 05/29/2015] levothyroxine  150 mcg Oral Daily  . sodium chloride  3 mL Intravenous Q12H  . traZODone  25-50 mg Oral QHS   Infusions:  . diltiazem (CARDIZEM)  infusion 5 mg/hr (05/28/15 2124)    Assessment: 74yo female was seen by PCP today for SOB, found to be in new-onset Afib w/ severe sx so was sent to ED, CHADVASC2 score of 4, to begin anticoagulation.  Of note SCr is up to 1.24 from baseline ~1.  Goal of Therapy:  Anti-Xa level 0.6-1 units/ml 4hrs after LMWH dose given Monitor platelets by anticoagulation protocol: Yes   Plan:  Will begin Lovenox 60mg  SQ Q12H and monitor CrCl and CBC; will f/u re: long-term anticoag.  Rachel Keith, Rachel Keith 05/28/2015,11:24 PM

## 2015-05-28 NOTE — ED Notes (Addendum)
Called for triage x2 with no response 

## 2015-05-29 ENCOUNTER — Inpatient Hospital Stay (HOSPITAL_COMMUNITY): Payer: Commercial Managed Care - HMO

## 2015-05-29 ENCOUNTER — Ambulatory Visit: Payer: Commercial Managed Care - HMO | Admitting: Family Medicine

## 2015-05-29 DIAGNOSIS — I10 Essential (primary) hypertension: Secondary | ICD-10-CM

## 2015-05-29 DIAGNOSIS — N183 Chronic kidney disease, stage 3 unspecified: Secondary | ICD-10-CM | POA: Diagnosis present

## 2015-05-29 DIAGNOSIS — I509 Heart failure, unspecified: Secondary | ICD-10-CM

## 2015-05-29 DIAGNOSIS — I272 Other secondary pulmonary hypertension: Secondary | ICD-10-CM

## 2015-05-29 DIAGNOSIS — R0600 Dyspnea, unspecified: Secondary | ICD-10-CM | POA: Diagnosis present

## 2015-05-29 DIAGNOSIS — E038 Other specified hypothyroidism: Secondary | ICD-10-CM

## 2015-05-29 DIAGNOSIS — F419 Anxiety disorder, unspecified: Secondary | ICD-10-CM

## 2015-05-29 DIAGNOSIS — I071 Rheumatic tricuspid insufficiency: Secondary | ICD-10-CM | POA: Diagnosis present

## 2015-05-29 DIAGNOSIS — N189 Chronic kidney disease, unspecified: Secondary | ICD-10-CM

## 2015-05-29 DIAGNOSIS — I4891 Unspecified atrial fibrillation: Principal | ICD-10-CM

## 2015-05-29 DIAGNOSIS — I340 Nonrheumatic mitral (valve) insufficiency: Secondary | ICD-10-CM | POA: Diagnosis present

## 2015-05-29 LAB — BASIC METABOLIC PANEL
Anion gap: 12 (ref 5–15)
BUN: 15 mg/dL (ref 6–20)
CO2: 22 mmol/L (ref 22–32)
Calcium: 9.2 mg/dL (ref 8.9–10.3)
Chloride: 108 mmol/L (ref 101–111)
Creatinine, Ser: 1.07 mg/dL — ABNORMAL HIGH (ref 0.44–1.00)
GFR calc Af Amer: 57 mL/min — ABNORMAL LOW (ref >60)
GFR, EST NON AFRICAN AMERICAN: 49 mL/min — AB (ref >60)
GLUCOSE: 111 mg/dL — AB (ref 65–99)
POTASSIUM: 3.8 mmol/L (ref 3.5–5.1)
Sodium: 142 mmol/L (ref 135–145)

## 2015-05-29 LAB — URINALYSIS, ROUTINE W REFLEX MICROSCOPIC
BILIRUBIN URINE: NEGATIVE
GLUCOSE, UA: NEGATIVE mg/dL
KETONES UR: 15 mg/dL — AB
NITRITE: NEGATIVE
PH: 6 (ref 5.0–8.0)
Protein, ur: NEGATIVE mg/dL
SPECIFIC GRAVITY, URINE: 1.013 (ref 1.005–1.030)

## 2015-05-29 LAB — CBC
HCT: 43.6 % (ref 36.0–46.0)
Hemoglobin: 14.3 g/dL (ref 12.0–15.0)
MCH: 31.4 pg (ref 26.0–34.0)
MCHC: 32.8 g/dL (ref 30.0–36.0)
MCV: 95.8 fL (ref 78.0–100.0)
PLATELETS: 149 10*3/uL — AB (ref 150–400)
RBC: 4.55 MIL/uL (ref 3.87–5.11)
RDW: 15.3 % (ref 11.5–15.5)
WBC: 12.9 10*3/uL — ABNORMAL HIGH (ref 4.0–10.5)

## 2015-05-29 LAB — MRSA PCR SCREENING: MRSA by PCR: NEGATIVE

## 2015-05-29 LAB — URINE MICROSCOPIC-ADD ON

## 2015-05-29 MED ORDER — ATENOLOL 50 MG PO TABS
50.0000 mg | ORAL_TABLET | Freq: Every day | ORAL | Status: DC
  Administered 2015-05-29 – 2015-05-30 (×2): 50 mg via ORAL
  Filled 2015-05-29 (×2): qty 1

## 2015-05-29 MED ORDER — FUROSEMIDE 20 MG PO TABS
10.0000 mg | ORAL_TABLET | Freq: Every day | ORAL | Status: DC
  Administered 2015-05-30: 10 mg via ORAL
  Filled 2015-05-29: qty 1

## 2015-05-29 MED ORDER — ONE-DAILY MULTI VITAMINS PO TABS: 1.0000 | ORAL_TABLET | Freq: Every day | ORAL | Status: DC

## 2015-05-29 MED ORDER — HYDRALAZINE HCL 20 MG/ML IJ SOLN
5.0000 mg | INTRAMUSCULAR | Status: DC | PRN
  Administered 2015-05-29: 5 mg via INTRAVENOUS
  Filled 2015-05-29: qty 1

## 2015-05-29 MED ORDER — DILTIAZEM HCL ER 60 MG PO CP12
60.0000 mg | ORAL_CAPSULE | Freq: Two times a day (BID) | ORAL | Status: DC
  Administered 2015-05-29 – 2015-05-30 (×2): 60 mg via ORAL
  Filled 2015-05-29 (×3): qty 1

## 2015-05-29 MED ORDER — MIRABEGRON ER 25 MG PO TB24
25.0000 mg | ORAL_TABLET | Freq: Every day | ORAL | Status: DC
  Administered 2015-05-29 – 2015-05-30 (×2): 25 mg via ORAL
  Filled 2015-05-29 (×2): qty 1

## 2015-05-29 MED ORDER — ADULT MULTIVITAMIN W/MINERALS CH
1.0000 | ORAL_TABLET | Freq: Every day | ORAL | Status: DC
  Administered 2015-05-29 – 2015-05-30 (×2): 1 via ORAL
  Filled 2015-05-29 (×2): qty 1

## 2015-05-29 MED ORDER — ESTRADIOL 2 MG PO TABS
2.0000 mg | ORAL_TABLET | Freq: Every day | ORAL | Status: DC
  Administered 2015-05-29 – 2015-05-30 (×2): 2 mg via ORAL
  Filled 2015-05-29 (×2): qty 1

## 2015-05-29 MED ORDER — LEVALBUTEROL HCL 0.63 MG/3ML IN NEBU: 0.6300 mg | INHALATION_SOLUTION | RESPIRATORY_TRACT | Status: DC | PRN

## 2015-05-29 NOTE — Progress Notes (Signed)
Verbalized to go home at 11:30 am, if MD won't let her go she would sign out against med. advise. Dr Joseph Art made aware who claimed if she wants to go AMA to let her sign the paper. Explained to the pt about the needs to stay because of her heart rhythm is irregular need for her antiarrythmic iv drug is a necessity. Spoke with her son Arlys John who in turn talked to the pt. Patient finally decided to stay. Continue to monitor.

## 2015-05-29 NOTE — Care Management Note (Signed)
Case Management Note  Patient Details  Name: Hoorain Kozakiewicz MRN: [id number] Date of Birth: 1940/12/16  Subjective/Objective:   Adm w at fib w rvr                 Action/Plan: lives w fam, pcp dr Linford Arnold   Expected Discharge Date:                  Expected Discharge Plan:     In-House Referral:     Discharge planning Services     Post Acute Care Choice:    Choice offered to:     DME Arranged:    DME Agency:     HH Arranged:    HH Agency:     Status of Service:     Medicare Important Message Given:    Date Medicare IM Given:    Medicare IM give by:    Date Additional Medicare IM Given:    Additional Medicare Important Message give by:     If discussed at Long Length of Stay Meetings, dates discussed:    Additional Comments: ur review done  Hanley Hays, RN 05/29/2015, 8:28 AM

## 2015-05-29 NOTE — Progress Notes (Signed)
Echocardiogram 2D Echocardiogram has been performed.  Rachel Keith 05/29/2015, 3:56 PM

## 2015-05-29 NOTE — Progress Notes (Signed)
Refused  constant checking of blood pressure claiming it gives her pain on her arm, explained to her the needs for monitoring her BP due to cardizem infusion. Cont. to monitor.

## 2015-05-29 NOTE — Evaluation (Signed)
Physical Therapy Evaluation Patient Details Name: Rachel Keith MRN: [id number] DOB: [birth year] Today's Date: 05/29/2015   History of Present Illness  Rachel Keith is a 75 year old female with a past medical history significant for hypothyroidism and hypertension; who presents from her primary care office with complaints of shortness of breath. Symptoms initially started 1 week ago. Noted getting easily winded with walking up one flight of steps. Denies any chest pain, palpitations, fever, chills, or cough. Associated symptoms including malaise, fatigue, and intermittent wheezing. She went to her primary care physician office today due to the symptoms of which she was having an 8 EKG was performed which showed that she had atrial fibrillation with RVR rate of 139. She was instructed to come immediately to the emergency department which she did.  Clinical Impression  Pt admitted with above diagnosis. Pt currently with functional limitations due to the deficits listed below (see PT Problem List). Pt is ambulating well overall.  Not needing device.  Did have episode of incontinence. Pt should be able to return home with sons assist once medical issues resolved.  Will see one more time to go up and down stairs if pt is still here tomorrow given that pt has a flight to bedrooms.  Will follow acutely.   Pt will benefit from skilled PT to increase their independence and safety with mobility to allow discharge to the venue listed below.      Follow Up Recommendations No PT follow up;Supervision - Intermittent    Equipment Recommendations  None recommended by PT    Recommendations for Other Services       Precautions / Restrictions Precautions Precautions: None Restrictions Weight Bearing Restrictions: No      Mobility  Bed Mobility Overal bed mobility: Independent                Transfers Overall transfer level: Independent                  Ambulation/Gait Ambulation/Gait  assistance: Supervision Ambulation Distance (Feet): 200 Feet Assistive device: None Gait Pattern/deviations: Step-through pattern;Decreased stride length   Gait velocity interpretation: <1.8 ft/sec, indicative of risk for recurrent falls General Gait Details: Pt stated she had to use the bathroom.  Ambulated to bathroom with pt beginning to urinate prior to getting to bathroom.  Pt states she can have difficulty with this at home but the medicine is making her urinate more.  Obtained a new gown and new socks as welll as mesh panties and pads and pt was able to clean herself without asist and place socks on.  Pt ambulating well overall.  HR incr with activity with sats decr to 78% but up to 88% with cues for pursed lip breathing with DOE 3/4 with pt appearing unaware that she was getting winded.    Stairs            Wheelchair Mobility    Modified Rankin (Stroke Patients Only)       Balance Overall balance assessment: Needs assistance Sitting-balance support: No upper extremity supported;Feet supported Sitting balance-Leahy Scale: Good     Standing balance support: No upper extremity supported;During functional activity Standing balance-Leahy Scale: Fair Standing balance comment: can stand statically without UE support.                             Pertinent Vitals/Pain Pain Assessment: No/denies pain  HR 86-110 bpm with actiivity with irregular HR, 97% on RA, 22  RR.     Home Living Family/patient expects to be discharged to:: Private residence Living Arrangements: Children Available Help at Discharge: Family;Other (Comment) (close to 24 hours, son works 130am-830am.) Type of Home: House Home Access: Level entry     Home Layout: Two level Home Equipment: None      Prior Function Level of Independence: Independent               Hand Dominance        Extremity/Trunk Assessment   Upper Extremity Assessment: Defer to OT evaluation            Lower Extremity Assessment: Overall WFL for tasks assessed      Cervical / Trunk Assessment: Normal  Communication   Communication: No difficulties  Cognition Arousal/Alertness: Awake/alert Behavior During Therapy: WFL for tasks assessed/performed Overall Cognitive Status: Within Functional Limits for tasks assessed                      General Comments      Exercises General Exercises - Lower Extremity Ankle Circles/Pumps: AROM;Both;10 reps;Seated Long Arc Quad: AROM;Both;10 reps;Seated      Assessment/Plan    PT Assessment Patient needs continued PT services  PT Diagnosis Generalized weakness   PT Problem List Decreased activity tolerance;Decreased balance;Decreased mobility;Decreased knowledge of use of DME;Decreased safety awareness;Decreased knowledge of precautions  PT Treatment Interventions DME instruction;Gait training;Functional mobility training;Therapeutic activities;Therapeutic exercise;Balance training;Patient/family education   PT Goals (Current goals can be found in the Care Plan section) Acute Rehab PT Goals Patient Stated Goal: to go home PT Goal Formulation: With patient Time For Goal Achievement: 06/05/15 Potential to Achieve Goals: Good    Frequency Min 3X/week   Barriers to discharge        Co-evaluation               End of Session Equipment Utilized During Treatment: Gait belt Activity Tolerance: Patient limited by fatigue Patient left: in chair;with call [place]/phone within reach;with family/visitor present Nurse Communication: Mobility status         Time: 9147-8295 PT Time Calculation (min) (ACUTE ONLY): 34 min   Charges:   PT Evaluation $PT Eval Low Complexity: 1 Procedure PT Treatments $Gait Training: 8-22 mins   PT G CodesBerline Lopes 06-06-15, 12:15 PM  Fidelis Loth,PT Acute Rehabilitation [phone] (pager)

## 2015-05-29 NOTE — Progress Notes (Deleted)
NP made aware about the bp-systolic 70's asymptomatic, claimed to wait until picc line inserted bp might go up.

## 2015-05-29 NOTE — Progress Notes (Signed)
[facility] TEAM 1 - Stepdown/ICU TEAM Progress Note  Rachel Keith ONG:[id number] DOB: 1940-09-02 DOA: 05/28/2015 PCP: Nani Gasser, MD  Admit HPI / Brief Narrative: 75 year old female PMHx Anxiety, Hypothyroidism, HTN, CKD stage III  Presents from her primary care office with complaints of shortness of breath. Symptoms initially started 1 week ago. Noted getting easily winded with walking up one flight of steps. Denies any chest pain, palpitations, fever, chills, or cough. Associated symptoms including malaise, fatigue, and intermittent wheezing. She went to her primary care physician office today due to the symptoms of which she was having an 8 EKG was performed which showed that she had atrial fibrillation with RVR rate of 139. She was instructed to come immediately to the emergency department which she did.  Upon admission into the emergency department heart rates anywhere between 120 and 150. Initial EKG confirmed atrial fibrillation with RVR. Patient was started on a diltiazem drip.   HPI/Subjective: 1/17 A/O 4, NAD. States ambulated around ward without DOE/CP  Assessment/Plan: Atrial fibrillation with RVR:  Acute. Patient notes ever having symptoms like this previously in the past and reports shortness of breath. Initial EKG showing A. fib with RVR erythema 120-150's. On a diltiazem drip in the ED with improvement of heart rates. Chadsvasc score 4. - Diltiazem by mouth 60 mg BID - Continue Atenolol 50 mg daily  -Lasix 10 mg daily - Anticoagulation of Lovenox per pharmacy  - Determined which anticoagulation medication will work best with the patient  AV/TV/MV moderate regurgitation -Patient with heart failure, which most likely result in her pulmonary edema has resolved -Strict I&O since admission +19.39ml -Daily weight admission weight=  58.5kg                  1/17 standing weight= 59.8 kg  Pulmonary Hypertension -See regurgitation  Essential hypertension,  -See A. fib  RVR  Chronic kidney disease stage III: Cr Baseline ~0.91-1.04.  -On admission Cr 1.24. Given 1 L of fluid in the emergency department - Slightly outside normal  Anxiety - Continue Valium 10 mg Daily prn  Hypothyroidism:  -TSH of 2.35 of admission.  - Continue levothyroxine 150 g daily - May need to write prescription for levothyroxine until mail order arrives   Insomnia  - changed Ambien to 10 mg per patient as pharmacy does not have melatonin 10 mg   Code Status: FULL Family Communication: no family present at time of exam Disposition Plan: DC in a.m.    Consultants: NA  Procedure/Significant Events: 1/17 echocardiogram;LVEF= 50% to 55%.-Aortic valve: moderate regurgitation.-Mitral valve: moderate regurgitation. - Left atrium: moderately dilated.- Tricuspid valve: moderate regurgitation. - Pulmonary arteries: PA peak pressure: 52 mm Hg (S). - Pericardium,Trivial pericardial effusion  posterior to the heart. -left pleural effusion.   Culture NA  Antibiotics: NA  DVT prophylaxis: Full dose Lovenox   Devices NA   LINES / TUBES:  NA    Continuous Infusions:    Objective: VITAL SIGNS: Temp: 97.8 F (36.6 C) (01/17 1806) Temp Source: Oral (01/17 1806) BP: 147/75 mmHg (01/17 1643) Pulse Rate: 93 (01/17 1643) SPO2; FIO2:   Intake/Output Summary (Last 24 hours) at 05/29/15 2016 Last data filed at 05/29/15 1900  Gross per 24 hour  Intake  895.5 ml  Output    875 ml  Net   20.5 ml     Exam: General: A/O 4, No acute respiratory distress Eyes: Negative headache, double vision,negative scleral hemorrhage ENT: Negative Runny nose, negative gingival bleeding, Neck:  Negative scars,  masses, torticollis, lymphadenopathy, JVD Lungs: Clear to auscultation bilaterally without wheezes or crackles Cardiovascular: Irregular regular rhythm and rate without murmur gallop or rub normal S1 and S2 Abdomen:negative abdominal pain, nondistended, positive soft,  bowel sounds, no rebound, no ascites, no appreciable mass Extremities: No significant cyanosis, clubbing, or edema bilateral lower extremities Psychiatric:  Negative depression, negative anxiety, negative fatigue, negative mania Neurologic:  Cranial nerves II through XII intact, tongue/uvula midline, all extremities muscle strength 5/5, sensation intact throughout, negative dysarthria, negative expressive aphasia, negative receptive aphasia.   Data Reviewed: Basic Metabolic Panel:  Recent Labs Lab 05/28/15 1854 05/29/15 0235  NA 145 142  K 4.5 3.8  CL 109 108  CO2 23 22  GLUCOSE 119* 111*  BUN 17 15  CREATININE 1.24* 1.07*  CALCIUM 10.0 9.2  MG 2.1  --    Liver Function Tests: No results for input(s): AST, ALT, ALKPHOS, BILITOT, PROT, ALBUMIN in the last 168 hours. No results for input(s): LIPASE, AMYLASE in the last 168 hours. No results for input(s): AMMONIA in the last 168 hours. CBC:  Recent Labs Lab 05/28/15 1854 05/29/15 0235  WBC 13.5* 12.9*  HGB 15.7* 14.3  HCT 47.4* 43.6  MCV 96.7 95.8  PLT 147* 149*   Cardiac Enzymes:  Recent Labs Lab 05/28/15 1854  TROPONINI <0.03   BNP (last 3 results) No results for input(s): BNP in the last 8760 hours.  ProBNP (last 3 results) No results for input(s): PROBNP in the last 8760 hours.  CBG: No results for input(s): GLUCAP in the last 168 hours.  Recent Results (from the past 240 hour(s))  MRSA PCR Screening     Status: None   Collection Time: 05/28/15 11:23 PM  Result Value Ref Range Status   MRSA by PCR NEGATIVE NEGATIVE Final    Comment:        The GeneXpert MRSA Assay (FDA approved for NASAL specimens only), is one component of a comprehensive MRSA colonization surveillance program. It is not intended to diagnose MRSA infection nor to guide or monitor treatment for MRSA infections.      Studies:  Recent x-ray studies have been reviewed in detail by the Attending Physician  Scheduled  Meds:  Scheduled Meds: . atenolol  50 mg Oral Daily  . benazepril  40 mg Oral Daily  . diclofenac sodium  2 g Topical QID  . diltiazem  60 mg Oral Q12H  . enoxaparin (LOVENOX) injection  60 mg Subcutaneous Q12H  . estradiol  2 mg Oral Daily  . fluticasone  1 spray Each Nare Daily  . furosemide  10 mg Intravenous Once  . furosemide  10 mg Oral Daily  . levothyroxine  150 mcg Oral QAC breakfast  . mirabegron ER  25 mg Oral Daily  . multivitamin with minerals  1 tablet Oral Daily  . sodium chloride  3 mL Intravenous Q12H  . traZODone  25-50 mg Oral QHS    Time spent on care of this patient: 40 mins   Lashina Milles, Roselind Messier , MD  [facility] Office  [phone] Pager - [phone]  On-Call/Text Page:      [email or web address]      password [email or web address]  If 7PM-7AM, please contact night-coverage [email or web address] Password [email or web address] 05/29/2015, 8:16 PM   LOS: 1 day   Care during the described time interval was provided by me .  I have reviewed this patient's available data, including medical history, events of note, physical examination, and all test results as part of my  evaluation. I have personally reviewed and interpreted all radiology studies.   Carolyne Littlesurtis Kristalynn Coddington, MD [phone] Pager

## 2015-05-30 MED ORDER — DILTIAZEM HCL ER 90 MG PO CP12: 90.0000 mg | ORAL_CAPSULE | Freq: Two times a day (BID) | ORAL | Status: DC

## 2015-05-30 MED ORDER — APIXABAN 5 MG PO TABS
5.0000 mg | ORAL_TABLET | Freq: Two times a day (BID) | ORAL | Status: DC
  Administered 2015-05-30: 5 mg via ORAL
  Filled 2015-05-30: qty 1

## 2015-05-30 MED ORDER — APIXABAN 5 MG PO TABS: 5.0000 mg | ORAL_TABLET | Freq: Two times a day (BID) | ORAL | Status: DC

## 2015-05-30 MED ORDER — DILTIAZEM HCL ER 90 MG PO CP12
90.0000 mg | ORAL_CAPSULE | Freq: Two times a day (BID) | ORAL | Status: DC
  Filled 2015-05-30: qty 1

## 2015-05-30 NOTE — Progress Notes (Signed)
Care management made aware about  the eliquis prescribed , 30 day free trial  And prescription given.

## 2015-05-30 NOTE — Discharge Instructions (Signed)
Apixaban oral tablets °What is this medicine? °APIXABAN (a PIX a ban) is an anticoagulant (blood thinner). It is used to lower the chance of stroke in people with a medical condition called atrial fibrillation. It is also used to treat or prevent blood clots in the lungs or in the veins. °This medicine may be used for other purposes; ask your health care provider or pharmacist if you have questions. °What should I tell my health care provider before I take this medicine? °They need to know if you have any of these conditions: °-bleeding disorders °-bleeding in the brain °-blood in your stools (black or tarry stools) or if you have blood in your vomit °-history of stomach bleeding °-kidney disease °-liver disease °-mechanical heart valve °-an unusual or allergic reaction to apixaban, other medicines, foods, dyes, or preservatives °-pregnant or trying to get pregnant °-breast-feeding °How should I use this medicine? °Take this medicine by mouth with a glass of water. Follow the directions on the prescription label. You can take it with or without food. If it upsets your stomach, take it with food. Take your medicine at regular intervals. Do not take it more often than directed. Do not stop taking except on your doctor's advice. Stopping this medicine may increase your risk of a blot clot. Be sure to refill your prescription before you run out of medicine. °Talk to your pediatrician regarding the use of this medicine in children. Special care may be needed. °Overdosage: If you think you have taken too much of this medicine contact a poison control center or emergency room at once. °NOTE: This medicine is only for you. Do not share this medicine with others. °What if I miss a dose? °If you miss a dose, take it as soon as you can. If it is almost time for your next dose, take only that dose. Do not take double or extra doses. °What may interact with this medicine? °This medicine may interact with the following: °-aspirin  and aspirin-like medicines °-certain medicines for fungal infections like ketoconazole and itraconazole °-certain medicines for seizures like carbamazepine and phenytoin °-certain medicines that treat or prevent blood clots like warfarin, enoxaparin, and dalteparin °-clarithromycin °-NSAIDs, medicines for pain and inflammation, like ibuprofen or naproxen °-rifampin °-ritonavir °-St. John's wort °This list may not describe all possible interactions. Give your health care provider a list of all the medicines, herbs, non-prescription drugs, or dietary supplements you use. Also tell them if you smoke, drink alcohol, or use illegal drugs. Some items may interact with your medicine. °What should I watch for while using this medicine? °Notify your doctor or health care professional and seek emergency treatment if you develop breathing problems; changes in vision; chest pain; severe, sudden headache; pain, swelling, warmth in the leg; trouble speaking; sudden numbness or weakness of the face, arm, or leg. These can be signs that your condition has gotten worse. °If you are going to have surgery, tell your doctor or health care professional that you are taking this medicine. °Tell your health care professional that you use this medicine before you have a spinal or epidural procedure. Sometimes people who take this medicine have bleeding problems around the spine when they have a spinal or epidural procedure. This bleeding is very rare. If you have a spinal or epidural procedure while on this medicine, call your health care professional immediately if you have back pain, numbness or tingling (especially in your legs and feet), muscle weakness, paralysis, or loss of bladder or bowel   control. Avoid sports and activities that might cause injury while you are using this medicine. Severe falls or injuries can cause unseen bleeding. Be careful when using sharp tools or knives. Consider using an Neurosurgeon. Take special care  brushing or flossing your teeth. Report any injuries, bruising, or red spots on the skin to your doctor or health care professional. What side effects may I notice from receiving this medicine? Side effects that you should report to your doctor or health care professional as soon as possible: -allergic reactions like skin rash, itching or hives, swelling of the face, lips, or tongue -signs and symptoms of bleeding such as bloody or black, tarry stools; red or dark-brown urine; spitting up blood or brown material that looks like coffee grounds; red spots on the skin; unusual bruising or bleeding from the eye, gums, or nose This list may not describe all possible side effects. Call your doctor for medical advice about side effects. You may report side effects to FDA at 1-800-FDA-1088. Where should I keep my medicine? Keep out of the reach of children. Store at room temperature between 20 and 25 degrees C (68 and 77 degrees F). Throw away any unused medicine after the expiration date. NOTE: This sheet is a summary. It may not cover all possible information. If you have questions about this medicine, talk to your doctor, pharmacist, or health care provider.    2016, Elsevier/Gold Standard. (2012-12-31 11:59:24)  Atrial Fibrillation Atrial fibrillation is a type of irregular or rapid heartbeat (arrhythmia). In atrial fibrillation, the heart quivers continuously in a chaotic pattern. This occurs when parts of the heart receive disorganized signals that make the heart unable to pump blood normally. This can increase the risk for stroke, heart failure, and other heart-related conditions. There are different types of atrial fibrillation, including:  Paroxysmal atrial fibrillation. This type starts suddenly, and it usually stops on its own shortly after it starts.  Persistent atrial fibrillation. This type often lasts longer than a week. It may stop on its own or with treatment.  Long-lasting persistent  atrial fibrillation. This type lasts longer than 12 months.  Permanent atrial fibrillation. This type does not go away. Talk with your health care provider to learn about the type of atrial fibrillation that you have. CAUSES This condition is caused by some heart-related conditions or procedures, including:  A heart attack.  Coronary artery disease.  Heart failure.  Heart valve conditions.  High blood pressure.  Inflammation of the sac that surrounds the heart (pericarditis).  Heart surgery.  Certain heart rhythm disorders, such as Wolf-Parkinson-White syndrome. Other causes include:  Pneumonia.  Obstructive sleep apnea.  Blockage of an artery in the lungs (pulmonary embolism, or PE).  Lung cancer.  Chronic lung disease.  Thyroid problems, especially if the thyroid is overactive (hyperthyroidism).  Caffeine.  Excessive alcohol use or illegal drug use.  Use of some medicines, including certain decongestants and diet pills. Sometimes, the cause cannot be found. RISK FACTORS This condition is more likely to develop in:  People who are older in age.  People who smoke.  People who have diabetes mellitus.  People who are overweight (obese).  Athletes who exercise vigorously. SYMPTOMS Symptoms of this condition include:  A feeling that your heart is beating rapidly or irregularly.  A feeling of discomfort or pain in your chest.  Shortness of breath.  Sudden light-headedness or weakness.  Getting tired easily during exercise. In some cases, there are no symptoms. DIAGNOSIS Your health  care provider may be able to detect atrial fibrillation when taking your pulse. If detected, this condition may be diagnosed with:  An electrocardiogram (ECG).  A Holter monitor test that records your heartbeat patterns over a 24-hour period.  Transthoracic echocardiogram (TTE) to evaluate how blood flows through your heart.  Transesophageal echocardiogram (TEE) to  view more detailed images of your heart.  A stress test.  Imaging tests, such as a CT scan or chest X-ray.  Blood tests. TREATMENT The main goals of treatment are to prevent blood clots from forming and to keep your heart beating at a normal rate and rhythm. The type of treatment that you receive depends on many factors, such as your underlying medical conditions and how you feel when you are experiencing atrial fibrillation. This condition may be treated with:  Medicine to slow down the heart rate, bring the heart's rhythm back to normal, or prevent clots from forming.  Electrical cardioversion. This is a procedure that resets your heart's rhythm by delivering a controlled, low-energy shock to the heart through your skin.  Different types of ablation, such as catheter ablation, catheter ablation with pacemaker, or surgical ablation. These procedures destroy the heart tissues that send abnormal signals. When the pacemaker is used, it is placed under your skin to help your heart beat in a regular rhythm. HOME CARE INSTRUCTIONS  Take over-the counter and prescription medicines only as told by your health care provider.  If your health care provider prescribed a blood-thinning medicine (anticoagulant), take it exactly as told. Taking too much blood-thinning medicine can cause bleeding. If you do not take enough blood-thinning medicine, you will not have the protection that you need against stroke and other problems.  Do not use tobacco products, including cigarettes, chewing tobacco, and e-cigarettes. If you need help quitting, ask your health care provider.  If you have obstructive sleep apnea, manage your condition as told by your health care provider.  Do not drink alcohol.  Do not drink beverages that contain caffeine, such as coffee, soda, and tea.  Maintain a healthy weight. Do not use diet pills unless your health care provider approves. Diet pills may make heart problems  worse.  Follow diet instructions as told by your health care provider.  Exercise regularly as told by your health care provider.  Keep all follow-up visits as told by your health care provider. This is important. PREVENTION  Avoid drinking beverages that contain caffeine or alcohol.  Avoid certain medicines, especially medicines that are used for breathing problems.  Avoid certain herbs and herbal medicines, such as those that contain ephedra or ginseng.  Do not use illegal drugs, such as cocaine and amphetamines.  Do not smoke.  Manage your high blood pressure. SEEK MEDICAL CARE IF:  You notice a change in the rate, rhythm, or strength of your heartbeat.  You are taking an anticoagulant and you notice increased bruising.  You tire more easily when you exercise or exert yourself. SEEK IMMEDIATE MEDICAL CARE IF:  You have chest pain, abdominal pain, sweating, or weakness.  You feel nauseous.  You notice blood in your vomit, bowel movement, or urine.  You have shortness of breath.  You suddenly have swollen feet and ankles.  You feel dizzy.  You have sudden weakness or numbness of the face, arm, or leg, especially on one side of the body.  You have trouble speaking, trouble understanding, or both (aphasia).  Your face or your eyelid droops on one side. These symptoms  may represent a serious problem that is an emergency. Do not wait to see if the symptoms will go away. Get medical help right away. Call your local emergency services (911 in the [place] Do not drive yourself to the hospital.   This information is not intended to replace advice given to you by your health care provider. Make sure you discuss any questions you have with your health care provider.   Document Released: 04/28/2005 Document Revised: 01/17/2015 Document Reviewed: 08/23/2014 Elsevier Interactive Patient Education [employer]

## 2015-05-30 NOTE — Discharge Summary (Signed)
DISCHARGE SUMMARY  Rachel Keith  MR#: [id number]  DOB:04-28-1941  Date of Admission: 05/28/2015 Date of Discharge: 05/30/2015  Attending Physician:Ezmae Speers T  Patient's WGN:FAOZHYQM,VHQIONGEX, MD  Consults:  None   Disposition: D/C home at patient request   Follow-up Appts:     Follow-up Information    Follow up with [facility]. Schedule an appointment as soon as possible for a visit in 2 weeks.   Why:  Schedule appointment with [facility] in 2 weeks to establish care   Contact information:   [address] [place] [address]       Follow up with METHENEY,CATHERINE, MD. Schedule an appointment as soon as possible for a visit in 1 week.   Specialty:  Family Medicine   Contact information:   [phone] [address] [place] [address] [phone]      Tests Needing Follow-up: -recheck of HR will be indicated -monitoring of tolerance of blood thinning medication will be indicated  -at least annual monitoring of heart valve status will be indicated   Discharge Diagnoses: Newly diagnosed Atrial fibrillation with RVR: AV/TV/MV moderate regurgitation Pulmonary Hypertension Essential hypertension Chronic kidney disease stage III Anxiety Hypothyroidism  Insomnia  Abnormal UA  Initial presentation: 75 year old female Hx Anxiety, Hypothyroidism, HTN, and CKD stage III who presented from her primary care office with complaints of shortness of breath. Symptoms started 1 week prior. Noted getting easily winded with walking up one flight of steps. Denied chest pain, palpitations, fever, chills, or cough. She went to her primary care physician office where an EKG showed atrial fibrillation with RVR at 139.   Upon admission to the ED heart rates ranged between 120 and 150. Initial EKG confirmed atrial fibrillation with RVR. Patient was started on a diltiazem drip.   Hospital Course:  Newly diagnosed Atrial fibrillation with  RVR: Chadsvasc score 4 - rate better controlled on oral meds on day of d/c, but not yet ideal w/ HR as high as 110 at rest - nonetheless, the pt strongly desired d/c home - I agreed to gently increase her CCB but proceed w/ d/c home since she has a working relationship w/ a PCP w/ whom she can f/u - transition to oral anticoag at time of d/c - discuss risks associated w/ anticoag, as well as risk of CVA w/o anticoag - pt agrees to begin coag   AV/TV/MV moderate regurgitation -will need to be followed as outpt - presently well compensated   Pulmonary Hypertension -presently well compensated   Essential hypertension -BP reasonably controlled - further titration of meds may be indicated in outpt setting   Chronic kidney disease stage III Cr baseline ~0.91-1.04 - admission Cr 1.24 - hydrated during hospital stay w/ return to essential baseline range   Anxiety -Continue Valium 10 mg daily prn  Hypothyroidism  -TSH of 2.35 on admission - continue levothyroxine 150 g daily but will not increase in setting of afib, though TSH not at ideal goal of 1.0  Insomnia  -cont home melatonin 10 mg  Abnormal UA -given high squam epi count suspect this was a poor collection      Medication List    STOP taking these medications        AMBULATORY NON FORMULARY MEDICATION      TAKE these medications        apixaban 5 MG Tabs tablet  Commonly known as:  ELIQUIS  Take 1 tablet (5 mg total) by mouth 2 (two) times daily.  atenolol 50 MG tablet  Commonly known as:  TENORMIN  TAKE 1 TABLET (50 MG TOTAL) BY MOUTH DAILY.     benazepril 40 MG tablet  Commonly known as:  LOTENSIN  TAKE 1 TABLET (40 MG TOTAL) BY MOUTH DAILY.     diazepam 10 MG tablet  Commonly known as:  VALIUM  TAKE 1 TABLET BY MOUTH EVERY DAY AS NEEDED     diltiazem 90 MG 12 hr capsule  Commonly known as:  CARDIZEM SR  Take 1 capsule (90 mg total) by mouth every 12 (twelve) hours.     estradiol 2 MG tablet  Commonly  known as:  ESTRACE  Take 2 mg by mouth daily.     levothyroxine 150 MCG tablet  Commonly known as:  SYNTHROID, LEVOTHROID  Take 1 tablet (150 mcg total) by mouth daily.     multivitamin tablet  Take 1 tablet by mouth daily.     MYRBETRIQ 25 MG Tb24 tablet  Generic drug:  mirabegron ER  Take 25 mg by mouth daily.     traZODone 50 MG tablet  Commonly known as:  DESYREL  Take 0.5-1 tablets (25-50 mg total) by mouth at bedtime.     VOLTAREN 1 % Gel  Generic drug:  diclofenac sodium  Apply 2 g topically 4 (four) times daily.        Day of Discharge BP 147/90 mmHg  Pulse 93  Temp(Src) 97.7 F (36.5 C) (Oral)  Resp 29  Ht 5' (1.524 m)  Wt 57.5 kg (126 lb 12.2 oz)  BMI 24.76 kg/m2  SpO2 96%  Physical Exam: General: No acute respiratory distress Lungs: Clear to auscultation bilaterally without wheezes or crackles Cardiovascular: irreg irreg - no gallup or M appreciable  Abdomen: Nontender, nondistended, soft, bowel sounds positive, no rebound, no ascites, no appreciable mass Extremities: No significant cyanosis, clubbing, or edema bilateral lower extremities  Basic Metabolic Panel:  Recent Labs Lab 05/28/15 1854 05/29/15 0235  NA 145 142  K 4.5 3.8  CL 109 108  CO2 23 22  GLUCOSE 119* 111*  BUN 17 15  CREATININE 1.24* 1.07*  CALCIUM 10.0 9.2  MG 2.1  --    Coags:  Recent Labs Lab 05/28/15 1854  INR 1.18   CBC:  Recent Labs Lab 05/28/15 1854 05/29/15 0235  WBC 13.5* 12.9*  HGB 15.7* 14.3  HCT 47.4* 43.6  MCV 96.7 95.8  PLT 147* 149*    Cardiac Enzymes:  Recent Labs Lab 05/28/15 1854  TROPONINI <0.03    Recent Results (from the past 240 hour(s))  MRSA PCR Screening     Status: None   Collection Time: 05/28/15 11:23 PM  Result Value Ref Range Status   MRSA by PCR NEGATIVE NEGATIVE Final    Comment:        The GeneXpert MRSA Assay (FDA approved for NASAL specimens only), is one component of a comprehensive MRSA  colonization surveillance program. It is not intended to diagnose MRSA infection nor to guide or monitor treatment for MRSA infections.      Time spent in discharge (includes decision making & examination of pt): >35 minutes  05/30/2015, 11:54 AM   Lonia Blood, MD [facility] Office  [phone] Pager [phone]  On-Call/Text Page:      [email or web address]      password [facility]

## 2015-05-30 NOTE — Progress Notes (Signed)
Discharged home by wheelchair, stable, accompanied by son, discharge instructions given to pt. Belongings taken home.

## 2015-05-30 NOTE — Progress Notes (Signed)
Physical Therapy Treatment and D/C Patient Details Name: Rachel Keith MRN: [id number] DOB: [birth year] Today's Date: 05/30/2015    History of Present Illness [name] is a 75 year old female with a past medical history significant for hypothyroidism and hypertension; who presents from her primary care office with complaints of shortness of breath. Symptoms initially started 1 week ago. Noted getting easily winded with walking up one flight of steps. Denies any chest pain, palpitations, fever, chills, or cough. Associated symptoms including malaise, fatigue, and intermittent wheezing. She went to her primary care physician office today due to the symptoms of which she was having an 8 EKG was performed which showed that she had atrial fibrillation with RVR rate of 139. She was instructed to come immediately to the emergency department which she did.    PT Comments    Pt admitted with above diagnosis. Pt currently without significant functional limitations and is I with ambulation.  Pt  Has no functional deficits at this point and is safe with movement.  Will d/c PT.    Follow Up Recommendations  No PT follow up;Supervision - Intermittent     Equipment Recommendations  None recommended by PT    Recommendations for Other Services       Precautions / Restrictions Precautions Precautions: None Restrictions Weight Bearing Restrictions: No    Mobility  Bed Mobility Overal bed mobility: Independent                Transfers Overall transfer level: Independent                  Ambulation/Gait Ambulation/Gait assistance: Independent Ambulation Distance (Feet): 400 Feet Assistive device: None Gait Pattern/deviations: WFL(Within Functional Limits)   Gait velocity interpretation: at or above normal speed for age/gender General Gait Details: Pt with good safety with ambulation.  No LOB with mod challenges to balance.  O2 sats >90% throughout.  HR slightly elevated.      Stairs Stairs: Yes Stairs assistance: Supervision Stair Management: Alternating pattern;One rail Right;Forwards Number of Stairs: 15 General stair comments: Pt was able to safely ascend and descend stairs.   Wheelchair Mobility    Modified Rankin (Stroke Patients Only)       Balance Overall balance assessment: Independent Sitting-balance support: No upper extremity supported;Feet supported Sitting balance-Leahy Scale: Good     Standing balance support: No upper extremity supported;During functional activity Standing balance-Leahy Scale: Good                      Cognition Arousal/Alertness: Awake/alert Behavior During Therapy: WFL for tasks assessed/performed Overall Cognitive Status: Within Functional Limits for tasks assessed                      Exercises General Exercises - Lower Extremity Ankle Circles/Pumps: AROM;Both;10 reps;Seated Long Arc Quad: AROM;Both;10 reps;Seated Hip Flexion/Marching: AROM;Both;10 reps;Seated    General Comments        Pertinent Vitals/Pain Pain Assessment: No/denies pain  90-93%RA, 113-140bpm    Home Living                      Prior Function            PT Goals (current goals can now be found in the care plan section) Acute Rehab PT Goals Patient Stated Goal: "I don't know why I am not going home already." Progress towards PT goals: Goals met/education completed, patient discharged from PT    Frequency  PT Plan Current plan remains appropriate    Co-evaluation             End of Session Equipment Utilized During Treatment: Gait belt Activity Tolerance: Patient tolerated treatment well Patient left: in bed;with call [place]/phone within reach     Time: 0916-0939 PT Time Calculation (min) (ACUTE ONLY): 23 min  Charges:  $Gait Training: 8-22 mins $Therapeutic Exercise: 8-22 mins                    G Codes:      WhiteArrie Aran F May 31, 2015, 11:35 AM [facility]  Rehabilitation [phone] (pager)

## 2015-05-30 NOTE — Progress Notes (Signed)
Pt was disch prior to being seen by cm. Staff nse gave pt 30day free eliquis card. Pt has [employer] ins.

## 2015-05-31 ENCOUNTER — Other Ambulatory Visit: Payer: Self-pay

## 2015-05-31 DIAGNOSIS — E038 Other specified hypothyroidism: Secondary | ICD-10-CM

## 2015-05-31 MED ORDER — LEVOTHYROXINE SODIUM 150 MCG PO TABS: 150.0000 ug | ORAL_TABLET | Freq: Every day | ORAL | Status: DC

## 2015-05-31 MED ORDER — ESTRADIOL 2 MG PO TABS: 2.0000 mg | ORAL_TABLET | Freq: Every day | ORAL | Status: DC

## 2015-06-07 ENCOUNTER — Encounter: Payer: Self-pay | Admitting: Family Medicine

## 2015-06-07 ENCOUNTER — Ambulatory Visit (INDEPENDENT_AMBULATORY_CARE_PROVIDER_SITE_OTHER): Payer: Commercial Managed Care - HMO | Admitting: Family Medicine

## 2015-06-07 VITALS — BP 127/95 | HR 99 | Ht 60.0 in | Wt 133.0 lb

## 2015-06-07 DIAGNOSIS — N183 Chronic kidney disease, stage 3 unspecified: Secondary | ICD-10-CM

## 2015-06-07 DIAGNOSIS — E038 Other specified hypothyroidism: Secondary | ICD-10-CM

## 2015-06-07 DIAGNOSIS — I10 Essential (primary) hypertension: Secondary | ICD-10-CM

## 2015-06-07 DIAGNOSIS — I4891 Unspecified atrial fibrillation: Secondary | ICD-10-CM | POA: Diagnosis not present

## 2015-06-07 DIAGNOSIS — Z79890 Hormone replacement therapy (postmenopausal): Secondary | ICD-10-CM

## 2015-06-07 MED ORDER — METOPROLOL TARTRATE 100 MG PO TABS: 100.0000 mg | ORAL_TABLET | Freq: Two times a day (BID) | ORAL | Status: DC

## 2015-06-07 MED ORDER — DIAZEPAM 10 MG PO TABS: 10.0000 mg | ORAL_TABLET | Freq: Every day | ORAL | Status: DC | PRN

## 2015-06-07 NOTE — Progress Notes (Signed)
   Subjective:    Patient ID: Rachel Keith, female    DOB: November 25, 1940, 75 y.o.   MRN: [id number]  HPI 75 year old female here today for new onset atrial fibrillation following up for hospitalization. She was seen in the office for new onset fatigue and shortness of breath. EKG refilled that she was in atrial fibrillation. Because she was so symptomatic we referred her to the emergency department for further evaluation and treatment. She was admitted on January 16th and discharged home 2 days later on January 18. She was started on Eloquis and the diltiazem was increased to 90 mg twice a day. Is also on atenolol 50 mg daily. Is not had any excess bruising or bleeding since I last saw her. She denies any chest pain or lower extremity edema. She still gets easily fatigued with activity.   Review of Systems     Objective:   Physical Exam  Constitutional: She is oriented to person, place, and time. She appears well-developed and well-nourished.  HENT:  Head: Normocephalic and atraumatic.  Neck: Neck supple. No thyromegaly present.  Cardiovascular: Normal rate.   Murmur heard. Irregular rhythm. 3 has 6 systolic murmur best heard on the left side of the sternum.  Pulmonary/Chest: Effort normal and breath sounds normal.  Musculoskeletal: She exhibits no edema.  Lymphadenopathy:    She has no cervical adenopathy.  Neurological: She is alert and oriented to person, place, and time.  Skin: Skin is warm and dry.  Psychiatric: She has a normal mood and affect. Her behavior is normal.          Assessment & Plan:  Atrial fibrillation with RVR- stable. She still feels a little short of breath and gets exhausted with activity but does feel better than she did about a week ago. We'll try to get a little bit better rate control we also need to get her back in with cardiology. She has not made an appointment yet. We'll try to find out when first available with Dr. Jens Som downstairs  is.  Hypertension- systolic well controlled.  Diastolic pressure is still how.  Will in atenolol.   Hypothyroidism-we'll recheck thyroid level in 2-3 weeks when she is no longer in the acute setting and can adjust medication if needed. Try to get TSH close to one if possible. Next  Pulmonary hypertension-currently stable and well compensated. Next  CKD 3-due to recheck kidney function. She was slightly dry on admission and was gently hydrated while hospitalized.  Anxiety-she's also requesting a refill on her Valium. She normally uses it as needed but she has used it more frequently since having atrial fibrillation because she says it does seem to calm her down when she starts getting uncomfortable with the rapid rate.   HRT - Estradiol 1.6 mg and estriol 3.2mg  SR. she really wants to have her dose increased. She feels like it's too low and so she spoke with several physicians at the hospital he felt that she might be able to increase her dose and get some improvement with her urinary symptoms. I explained that there is some increased risk of heart disease with estrogen and then a little hesitant to increase her dose especially after she just recently went atrial fibrillation.

## 2015-06-08 LAB — BASIC METABOLIC PANEL WITH GFR
BUN: 15 mg/dL (ref 7–25)
CHLORIDE: 109 mmol/L (ref 98–110)
CO2: 24 mmol/L (ref 20–31)
CREATININE: 1.11 mg/dL — AB (ref 0.60–0.93)
Calcium: 9.4 mg/dL (ref 8.6–10.4)
GFR, EST AFRICAN AMERICAN: 56 mL/min — AB (ref >=60)
GFR, Est Non African American: 49 mL/min — ABNORMAL LOW (ref >=60)
Glucose, Bld: 81 mg/dL (ref 65–99)
POTASSIUM: 4.4 mmol/L (ref 3.5–5.3)
SODIUM: 143 mmol/L (ref 135–146)

## 2015-06-08 LAB — TSH: TSH: 1.478 u[IU]/mL (ref 0.350–4.500)

## 2015-06-20 ENCOUNTER — Other Ambulatory Visit (HOSPITAL_COMMUNITY)
Admission: RE | Admit: 2015-06-20 | Discharge: 2015-06-20 | Disposition: A | Discharge status: Home / Self Care | Payer: Commercial Managed Care - HMO | Source: Ambulatory Visit | Attending: Family Medicine | Admitting: Family Medicine

## 2015-06-20 ENCOUNTER — Telehealth: Payer: Self-pay

## 2015-06-20 ENCOUNTER — Ambulatory Visit (INDEPENDENT_AMBULATORY_CARE_PROVIDER_SITE_OTHER): Payer: Commercial Managed Care - HMO | Admitting: Family Medicine

## 2015-06-20 ENCOUNTER — Encounter: Payer: Self-pay | Admitting: Family Medicine

## 2015-06-20 VITALS — BP 110/66 | HR 66 | Temp 98.7°F

## 2015-06-20 DIAGNOSIS — Z124 Encounter for screening for malignant neoplasm of cervix: Secondary | ICD-10-CM

## 2015-06-20 DIAGNOSIS — R319 Hematuria, unspecified: Secondary | ICD-10-CM | POA: Diagnosis not present

## 2015-06-20 DIAGNOSIS — Z1151 Encounter for screening for human papillomavirus (HPV): Secondary | ICD-10-CM | POA: Diagnosis present

## 2015-06-20 DIAGNOSIS — R300 Dysuria: Secondary | ICD-10-CM

## 2015-06-20 DIAGNOSIS — Z01419 Encounter for gynecological examination (general) (routine) without abnormal findings: Secondary | ICD-10-CM | POA: Diagnosis present

## 2015-06-20 DIAGNOSIS — N760 Acute vaginitis: Secondary | ICD-10-CM | POA: Diagnosis not present

## 2015-06-20 LAB — WET PREP FOR TRICH, YEAST, CLUE
Clue Cells Wet Prep HPF POC: NONE SEEN
TRICH WET PREP: NONE SEEN
YEAST WET PREP: NONE SEEN

## 2015-06-20 LAB — POCT URINALYSIS DIPSTICK
Glucose, UA: NEGATIVE
Ketones, UA: 15
NITRITE UA: NEGATIVE
PH UA: 6.5
Spec Grav, UA: 1.015
UROBILINOGEN UA: 1

## 2015-06-20 NOTE — Telephone Encounter (Signed)
Rachel Keith called and reports she has kidney stones. She states this is what is causing the frequent urination and blood in urine.

## 2015-06-20 NOTE — Progress Notes (Signed)
Subjective:    Patient ID: Rachel Keith, female    DOB: 10-03-1940, 75 y.o.   MRN: [id number]  HPI Yesterday she blood in her urine.  She had it today as well.  No pain or burning with urination.  No dysuria.  No fever, chills or sweats.  No blood in her stool. She denies any vaginal d/c.   She has gone through some several pads. She held her Eliquis last night and this morning.  No fever, chills. She has felt more sleepy the last couple of days as well. No worsening or alleviating factors.     Review of Systems   BP 110/66 mmHg  Pulse 66  Temp(Src) 98.7 F (37.1 C)  SpO2 96%    Allergies  Allergen Reactions  . Amlodipine Other (See Comments)    Didn't feel well on 2.5 mg dose.   . Sulfa Antibiotics Rash    Broke out in her joints one time as a teenager    Past Medical History  Diagnosis Date  . Hypertension     Past Surgical History  Procedure Laterality Date  . Cataract extraction  09/06/2010  . Abdominal hysterectomy    . Appendectomy    . Tonsillectomy    . Cholecystectomy    . Left ankle surgery    . Finger surgery      Social History   Social History  . Marital Status: Divorced    Spouse Name: N/A  . Number of Children: 4  . Years of Education: N/A   Occupational History  . retired    Social History Main Topics  . Smoking status: Never Smoker   . Smokeless tobacco: Not on file  . Alcohol Use: 0.0 oz/week    0 drink(s) per week     Comment: occasional  . Drug Use: No  . Sexual Activity: No   Other Topics Concern  . Not on file   Social History Narrative   1 caffeinated drink daily     Family History  Problem Relation Age of Onset  .       Outpatient Encounter Prescriptions as of 06/20/2015  Medication Sig  . apixaban (ELIQUIS) 5 MG TABS tablet Take 1 tablet (5 mg total) by mouth 2 (two) times daily.  . benazepril (LOTENSIN) 40 MG tablet TAKE 1 TABLET (40 MG TOTAL) BY MOUTH DAILY.  . diazepam (VALIUM) 10 MG tablet Take 1 tablet (10 mg  total) by mouth daily as needed.  . diclofenac sodium (VOLTAREN) 1 % GEL Apply 2 g topically 4 (four) times daily.  Marland Kitchen diltiazem (CARDIZEM SR) 90 MG 12 hr capsule Take 1 capsule (90 mg total) by mouth every 12 (twelve) hours.  Marland Kitchen estradiol (ESTRACE) 2 MG tablet Take 1 tablet (2 mg total) by mouth daily.  Marland Kitchen levothyroxine (SYNTHROID, LEVOTHROID) 150 MCG tablet Take 1 tablet (150 mcg total) by mouth daily.  . metoprolol (LOPRESSOR) 100 MG tablet Take 1 tablet (100 mg total) by mouth 2 (two) times daily.  . Multiple Vitamin (MULTIVITAMIN) tablet Take 1 tablet by mouth daily.  Marland Kitchen MYRBETRIQ 25 MG TB24 tablet Take 25 mg by mouth daily.  . traZODone (DESYREL) 50 MG tablet Take 0.5-1 tablets (25-50 mg total) by mouth at bedtime.   No facility-administered encounter medications on file as of 06/20/2015.        Objective:   Physical Exam  Constitutional: She is oriented to person, place, and time. She appears well-developed and well-nourished.  HENT:  Head: Normocephalic  and atraumatic.  Eyes: Conjunctivae and EOM are normal.  Cardiovascular: Normal rate.   Pulmonary/Chest: Effort normal.  Genitourinary: Uterus normal. There is no rash or lesion on the right labia. There is no rash or lesion on the left labia. Uterus is not deviated, not fixed and not tender. Cervix exhibits no motion tenderness, no discharge and no friability. Right adnexum displays no mass, no tenderness and no fullness. Left adnexum displays no mass, no tenderness and no fullness. No erythema, tenderness or bleeding in the vagina. No foreign body around the vagina. Vaginal discharge found.  Small amount of thick white vaginal discharge.  No tears around the urethra.   Neurological: She is alert and oriented to person, place, and time.  Skin: Skin is dry. No pallor.  Psychiatric: She has a normal mood and affect. Her behavior is normal.  Vitals reviewed.         Assessment & Plan:  Blood in the urine.  UA pos for blood, protein  and leuks.   Hold Elquis for now thought this could put her at risk of clot. Will get her in urgently with Urology based on the amount of blood she has been passing.  She has also had some recurrent UTIs and new onset incontinence in the last year or so.    May be able to sub an ASA in the interim.   Pelvic exam peformed. No blood in the vaginal canal or at the cervix. Pap smear performed today. Wet prep send to evaluate for yeast.

## 2015-06-20 NOTE — Telephone Encounter (Signed)
Please tell her thank you for letting us know.  Ok to restart the Eliquis this evening unless the Urologist recommended otherwise.

## 2015-06-21 ENCOUNTER — Telehealth: Payer: Self-pay | Admitting: Family Medicine

## 2015-06-21 NOTE — Telephone Encounter (Signed)
Left a message advising of recommendations.  

## 2015-06-21 NOTE — Telephone Encounter (Signed)
Patient called and wanted to speak with you and say that all the times she was having urine concerns and was never in pain to have that checked and it was her kidney stones and she wish she would of known then. And there a heart specialist in [place] name starts with an R and he was very good and if you knew who he was. I adv pt I would send a message. She said you two were going crazy trying to figure out what the issues was and it is Kidney Stones and she had some taken out today and not sure if its what she is eating or drinking but would like to know something. Dr. Gypsy Decant did not perform the surgery today he is on vacation but she had kidney stones removed and had a great experience. Patient wanted to talk to her but she thinks the world of you and wanted let you know. Thanks

## 2015-06-25 LAB — CYTOLOGY - PAP

## 2015-06-26 ENCOUNTER — Other Ambulatory Visit: Payer: Self-pay | Admitting: Family Medicine

## 2015-07-04 ENCOUNTER — Telehealth: Payer: Self-pay

## 2015-07-04 NOTE — Telephone Encounter (Signed)
Patient states Dr Sharon Seller has taken her off the diltiazem. Should she continue taking medication?

## 2015-07-06 NOTE — Telephone Encounter (Signed)
Patient advised of recommendations.  

## 2015-07-06 NOTE — Telephone Encounter (Signed)
Ok to hold for now

## 2015-07-09 DIAGNOSIS — Z7901 Long term (current) use of anticoagulants: Secondary | ICD-10-CM | POA: Insufficient documentation

## 2015-07-09 DIAGNOSIS — R531 Weakness: Secondary | ICD-10-CM | POA: Insufficient documentation

## 2015-07-10 DIAGNOSIS — R319 Hematuria, unspecified: Secondary | ICD-10-CM | POA: Insufficient documentation

## 2015-07-12 ENCOUNTER — Other Ambulatory Visit: Payer: Self-pay | Admitting: Family Medicine

## 2015-07-12 NOTE — Telephone Encounter (Signed)
Pt called office to request a refill on Cardizem. Refill sent.  Pt also states she has been in [facility] for the past couple days. She went to have her kidney stones removed but her pulse was too elevated and she ended up being put inpatient. Pt states they left the stents in and she is having a lot of blood come from them, Pt states she soaked through a mattress pad last night. Advised Pt if she is bleeding like that she should go to the ED. Pt then states she "doesnt want too" because she "hasn't eaten and needs to rest her legs."  Pt would like PCP to contact urologist who will be removing the kidney stones on Monday. Pt unsure of urologist name but is located at [facility] [place].

## 2015-07-16 ENCOUNTER — Telehealth: Payer: Self-pay

## 2015-07-16 ENCOUNTER — Other Ambulatory Visit: Payer: Self-pay | Admitting: Family Medicine

## 2015-07-16 DIAGNOSIS — E038 Other specified hypothyroidism: Secondary | ICD-10-CM

## 2015-07-16 MED ORDER — LEVOTHYROXINE SODIUM 150 MCG PO TABS: 150.0000 ug | ORAL_TABLET | Freq: Every day | ORAL | Status: DC

## 2015-07-16 MED ORDER — ESTRADIOL 2 MG PO TABS: 2.0000 mg | ORAL_TABLET | Freq: Every day | ORAL | Status: DC

## 2015-07-16 NOTE — Telephone Encounter (Signed)
Rachel Keith would like Dr Shelah LewandowskyMetheney's recommendations for a cardiologist. She [facility].

## 2015-07-16 NOTE — Telephone Encounter (Signed)
I really like Dr. Doreatha Lewrensaw downstairs.

## 2015-07-17 NOTE — Telephone Encounter (Signed)
Left message advising of recommendations.  

## 2015-07-18 MED ORDER — DILTIAZEM HCL ER 90 MG PO CP12: 90.0000 mg | ORAL_CAPSULE | Freq: Two times a day (BID) | ORAL | Status: DC

## 2015-07-20 ENCOUNTER — Telehealth: Payer: Self-pay

## 2015-07-20 NOTE — Telephone Encounter (Signed)
She states she is worried because she was bleeding so bad on the whole tablet of Eliquis. She doesn't want to go back on the whole tablet.

## 2015-07-20 NOTE — Telephone Encounter (Signed)
Rachel Keith called to see if she should continue with taking half a tablet of 5 mg Eliquis or should she move up to a whole tablet. Please advise.

## 2015-07-20 NOTE — Telephone Encounter (Signed)
She had a reason to bleed before because she was passing the stones. If the bleeding has completely stopped then I think she'll be safe to go back up on the Eliquis. I definitely would make sure the bleeding has stopped first though.

## 2015-07-20 NOTE — Telephone Encounter (Signed)
Yes, pleae go back up to whole tab.

## 2015-07-23 NOTE — Telephone Encounter (Signed)
Pt stated that she has quit bleeding.  She will try the whole pill and notify us if the bleeding begins again.

## 2015-07-26 ENCOUNTER — Ambulatory Visit: Payer: Commercial Managed Care - HMO | Admitting: Family Medicine

## 2015-07-26 ENCOUNTER — Encounter: Payer: Self-pay | Admitting: Family Medicine

## 2015-07-26 ENCOUNTER — Ambulatory Visit (INDEPENDENT_AMBULATORY_CARE_PROVIDER_SITE_OTHER): Payer: Commercial Managed Care - HMO | Admitting: Family Medicine

## 2015-07-26 VITALS — BP 118/76 | HR 85 | Ht 60.0 in | Wt 125.0 lb

## 2015-07-26 DIAGNOSIS — I4891 Unspecified atrial fibrillation: Secondary | ICD-10-CM

## 2015-07-26 DIAGNOSIS — I10 Essential (primary) hypertension: Secondary | ICD-10-CM

## 2015-07-26 DIAGNOSIS — N132 Hydronephrosis with renal and ureteral calculous obstruction: Secondary | ICD-10-CM | POA: Diagnosis not present

## 2015-07-26 NOTE — Progress Notes (Signed)
Subjective:    Patient ID: Rachel Keith, female    DOB: March 10, 1941, 75 y.o.   MRN: [id number]  HPI She is here for hospital follow-up. She had 3 left ureteral stones that were causing large amount of bleeding. She also underwent cardioversion for her afib as well. She is taking half a tab of her Eliquis.  She has had a dec appetite and she has lost 8 lbs.    Afib - she is not longer feeling SOB with getting the mail or walking up a hill Since having the last cardioversion.   Hypertension- Pt denies chest pain, SOB, dizziness, or heart palpitations.  Taking meds as directed w/o problems.  Denies medication side effects.      Review of Systems  BP 118/76 mmHg  Pulse 85  Ht 5' (1.524 m)  Wt 125 lb (56.7 kg)  BMI 24.41 kg/m2    Allergies  Allergen Reactions  . Amlodipine Other (See Comments)    Didn't feel well on 2.5 mg dose.   . Sulfa Antibiotics Rash    Broke out in her joints one time as a teenager    Past Medical History  Diagnosis Date  . Hypertension     Past Surgical History  Procedure Laterality Date  . Cataract extraction  09/06/2010  . Abdominal hysterectomy    . Appendectomy    . Tonsillectomy    . Cholecystectomy    . Left ankle surgery    . Finger surgery      Social History   Social History  . Marital Status: Divorced    Spouse Name: N/A  . Number of Children: 4  . Years of Education: N/A   Occupational History  . retired    Social History Main Topics  . Smoking status: Never Smoker   . Smokeless tobacco: Not on file  . Alcohol Use: 0.0 oz/week    0 drink(s) per week     Comment: occasional  . Drug Use: No  . Sexual Activity: No   Other Topics Concern  . Not on file   Social History Narrative   1 caffeinated drink daily     Family History  Problem Relation Age of Onset  .       Outpatient Encounter Prescriptions as of 07/26/2015  Medication Sig  . benazepril (LOTENSIN) 40 MG tablet TAKE 1 TABLET (40 MG TOTAL) BY MOUTH DAILY.  .  diazepam (VALIUM) 10 MG tablet Take 1 tablet (10 mg total) by mouth daily as needed.  . diclofenac sodium (VOLTAREN) 1 % GEL Apply 2 g topically 4 (four) times daily.  Marland Kitchen diltiazem (CARDIZEM SR) 90 MG 12 hr capsule Take 1 capsule (90 mg total) by mouth every 12 (twelve) hours.  Marland Kitchen ELIQUIS 5 MG TABS tablet TAKE 1 TABLET BY MOUTH TWICE A DAY (Patient taking differently: .5 tab once a day)  . estradiol (ESTRACE) 2 MG tablet Take 1 tablet (2 mg total) by mouth daily.  Marland Kitchen levothyroxine (SYNTHROID, LEVOTHROID) 150 MCG tablet Take 1 tablet (150 mcg total) by mouth daily.  . metoprolol (LOPRESSOR) 100 MG tablet Take 1 tablet (100 mg total) by mouth 2 (two) times daily.  . Multiple Vitamin (MULTIVITAMIN) tablet Take 1 tablet by mouth daily.  Marland Kitchen MYRBETRIQ 25 MG TB24 tablet Take 25 mg by mouth daily.  . traZODone (DESYREL) 50 MG tablet Take 0.5-1 tablets (25-50 mg total) by mouth at bedtime.   No facility-administered encounter medications on file as of 07/26/2015.  Objective:   Physical Exam  Constitutional: She is oriented to person, place, and time. She appears well-developed and well-nourished.  HENT:  Head: Normocephalic and atraumatic.  Neck: Neck supple. No thyromegaly present.  Cardiovascular: Normal rate and normal heart sounds.   Irregular rhythm  Pulmonary/Chest: Effort normal and breath sounds normal.  Lymphadenopathy:    She has no cervical adenopathy.  Neurological: She is alert and oriented to person, place, and time.  Skin: Skin is warm and dry.  Psychiatric: She has a normal mood and affect. Her behavior is normal.          Assessment & Plan:  Ureteral stones-it sounds like they were able to extract the stones and have sent them off for culture. They really feel like this may have been what was causing her incontinence and urinary symptoms that started back in December even though she was not having any pain at that time. She may want to stick with the urologist at  her follow-up appointment next month about whether or not she might be able to come off the medial retract. I now am wondering if a lot of her urinary symptoms were actually related to the stones and she may not actually have overactive bladder. It may be worth trying to take her off of the nearby track in the future.  Atrial fibrillation-she is in A. fib today but she is rate controlled. We need to get her in with a cardiologist for just routine monitoring at this point. Note, she is only taking a half of an Eliquis. When she went back up to a whole tab she started having blood in her urine again.  Hypertension-blood pressure looks fantastic today. Continue current regimen.  Lab Results  Component Value Date   WBC 12.9* 05/29/2015   HGB 14.3 05/29/2015   HCT 43.6 05/29/2015   MCV 95.8 05/29/2015   PLT 149* 05/29/2015

## 2015-07-30 ENCOUNTER — Other Ambulatory Visit: Payer: Self-pay

## 2015-07-30 MED ORDER — ESTRADIOL 2 MG PO TABS: 2.0000 mg | ORAL_TABLET | Freq: Every day | ORAL | Status: DC

## 2015-08-06 DIAGNOSIS — D62 Acute posthemorrhagic anemia: Secondary | ICD-10-CM | POA: Insufficient documentation

## 2015-08-08 ENCOUNTER — Telehealth: Payer: Self-pay

## 2015-08-08 DIAGNOSIS — I4891 Unspecified atrial fibrillation: Secondary | ICD-10-CM

## 2015-08-08 NOTE — Telephone Encounter (Signed)
Rachel Keith states she is in the hospital due to a fall. She states she passed out. She hit her head.   She is now concerned about her a fib and wants a referral to Cardiology, prefers [facility]. She wants it ASAP.

## 2015-08-08 NOTE — Telephone Encounter (Signed)
Ok to place referral. Can we see if Dr. Jens Somrenshaw has availability

## 2015-08-09 NOTE — Telephone Encounter (Signed)
Referral placed.

## 2015-08-11 DIAGNOSIS — Z9289 Personal history of other medical treatment: Secondary | ICD-10-CM

## 2015-08-11 HISTORY — DX: Personal history of other medical treatment: Z92.89

## 2015-08-28 ENCOUNTER — Ambulatory Visit (INDEPENDENT_AMBULATORY_CARE_PROVIDER_SITE_OTHER): Payer: Commercial Managed Care - HMO | Admitting: Osteopathic Medicine

## 2015-08-28 ENCOUNTER — Encounter: Payer: Self-pay | Admitting: Osteopathic Medicine

## 2015-08-28 VITALS — BP 121/78 | HR 84 | Ht 60.0 in | Wt 126.0 lb

## 2015-08-28 DIAGNOSIS — S0101XA Laceration without foreign body of scalp, initial encounter: Secondary | ICD-10-CM

## 2015-08-28 MED ORDER — CEPHALEXIN 500 MG PO CAPS: 500.0000 mg | ORAL_CAPSULE | Freq: Two times a day (BID) | ORAL | Status: DC

## 2015-08-28 NOTE — Patient Instructions (Signed)
Laceration Care, Adult A laceration is a cut that goes through all of the layers of the skin and into the tissue that is right under the skin. Some lacerations heal on their own. Others need to be closed with stitches (sutures), staples, skin adhesive strips, or skin glue. Proper laceration care minimizes the risk of infection and helps the laceration to heal better. HOW TO CARE FOR YOUR LACERATION If sutures or staples were used:  Keep the wound clean and dry.  If you were given a bandage (dressing), you should change it at least one time per day or as told by your health care provider. You should also change it if it becomes wet or dirty.  Keep the wound completely dry for the first 24 hours or as told by your health care provider. After that time, you may shower or bathe. However, make sure that the wound is not soaked in water until after the sutures or staples have been removed.  Clean the wound one time each day or as told by your health care provider:  Wash the wound with soap and water.  Rinse the wound with water to remove all soap.  Pat the wound dry with a clean towel. Do not rub the wound.  After cleaning the wound, apply a thin layer of antibiotic ointmentas told by your health care provider. This will help to prevent infection and keep the dressing from sticking to the wound.  Have the sutures or staples removed as told by your health care provider. If skin glue was used:  Try to keep the wound dry, but you may briefly wet it in the shower or bath. Do not soak the wound in water, such as by swimming.  After you have showered or bathed, gently pat the wound dry with a clean towel. Do not rub the wound.  Do not do any activities that will make you sweat heavily until the skin glue has fallen off on its own.  Do not apply liquid, cream, or ointment medicine to the wound while the skin glue is in place. Using those may loosen the film before the wound has healed.  If you  were given a bandage (dressing), you should change it at least one time per day or as told by your health care provider. You should also change it if it becomes dirty or wet.  If a dressing is placed over the wound, be careful not to apply tape directly over the skin glue. Doing that may cause the glue to be pulled off before the wound has healed.  Do not pick at the glue. The skin glue usually remains in place for 5-10 days, then it falls off of the skin. General Instructions  Take over-the-counter and prescription medicines only as told by your health care provider.  If you were prescribed an antibiotic medicine or ointment, take or apply it as told by your doctor. Do not stop using it even if your condition improves.  To help prevent scarring, make sure to cover your wound with sunscreen whenever you are outside after stitches are removed, after adhesive strips are removed, or when glue remains in place and the wound is healed. Make sure to wear a sunscreen of at least 30 SPF.  Do not scratch or pick at the wound.  Keep all follow-up visits as told by your health care provider. This is important.  Check your wound every day for signs of infection. Watch for:  Redness, swelling, or pain.  Fluid, blood, or pus.  Raise (elevate) the injured area above the level of your heart while you are sitting or lying down, if possible. SEEK MEDICAL CARE IF:  You received a tetanus shot and you have swelling, severe pain, redness, or bleeding at the injection site.  You have a fever.  A wound that was closed breaks open.  You notice a bad smell coming from your wound or your dressing.  You notice something coming out of the wound, such as wood or glass.  Your pain is not controlled with medicine.  You have increased redness, swelling, or pain at the site of your wound.  You have fluid, blood, or pus coming from your wound.  You notice a change in the color of your skin near your  wound.  You need to change the dressing frequently due to fluid, blood, or pus draining from the wound.  You develop a new rash.  You develop numbness around the wound. SEEK IMMEDIATE MEDICAL CARE IF:  You develop severe swelling around the wound.  Your pain suddenly increases and is severe.  You develop painful lumps near the wound or on skin that is anywhere on your body.  You have a red streak going away from your wound.  The wound is on your hand or foot and you cannot properly move a finger or toe.  The wound is on your hand or foot and you notice that your fingers or toes look pale or bluish.   This information is not intended to replace advice given to you by your health care provider. Make sure you discuss any questions you have with your health care provider.   Document Released: 04/28/2005 Document Revised: 09/12/2014 Document Reviewed: 04/24/2014 Elsevier Interactive Patient Education [employer]

## 2015-08-28 NOTE — Progress Notes (Addendum)
  Surgical assist on complex scalp laceration repair Indication: bleeding Location: Scalp Size: 3 cm across Anesthesia: 1%lidocaine with epi by Dr. Sunnie NielsenNatalie Keith, good effect Wound explored, surrounding necrotic eschar as well as hematoma was removed, I was able to visualize the galea aponeurotica, no bare skull was visible, I then undermined the edges of the wound using both sharp and blunt dissection, to the point where I could approximate the 2 edges of the wound. These were closed with #3 Prolene 0 sutures in a horizontal mattress pattern, and then covered with Dermabond. Type of suture material: Prolene 0 Number of sutures: 3 horizontal mattress Tolerated well Routine postprocedure instructions d/w pt- keep area clean and bandaged, follow up if concerns/spreading erythema/pain.   Follow up for for suture removal.

## 2015-08-28 NOTE — Progress Notes (Signed)
HPI: Rachel Keith is a 75 y.o. female who presents to [name] today for chief complaint of:  Chief Complaint  Patient presents with  . Fall    Paqtient hit back of her head during a fall and it is bleeding     . Location: back of scalp . Quality: cut/laceration, keeps bleeding at night . Duration: fall on concrete 8 days ago . Context: after fall, EMS took her to ER where absorbable sutures were placed, per the patient the ER doctor was angry that EMS didn't clean the wound properly . Assoc signs/symptoms: see ROS - no fever/chills, wound is a bit sore but not overly painful.    Past medical, social and family history reviewed: no concerns.   Current Outpatient Prescriptions  Medication Sig Dispense Refill  . benazepril (LOTENSIN) 40 MG tablet TAKE 1 TABLET (40 MG TOTAL) BY MOUTH DAILY. 90 tablet 2  . diazepam (VALIUM) 10 MG tablet Take 1 tablet (10 mg total) by mouth daily as needed. 90 tablet 0  . diclofenac sodium (VOLTAREN) 1 % GEL Apply 2 g topically 4 (four) times daily.    Marland Kitchen. diltiazem (CARDIZEM SR) 90 MG 12 hr capsule Take 1 capsule (90 mg total) by mouth every 12 (twelve) hours. 60 capsule 5  . ELIQUIS 5 MG TABS tablet TAKE 1 TABLET BY MOUTH TWICE A DAY (Patient taking differently: .5 tab once a day) 60 tablet 0  . estradiol (ESTRACE) 2 MG tablet Take 1 tablet (2 mg total) by mouth daily. 90 tablet 1  . levothyroxine (SYNTHROID, LEVOTHROID) 150 MCG tablet Take 1 tablet (150 mcg total) by mouth daily. 90 tablet 1  . metoprolol (LOPRESSOR) 100 MG tablet Take 1 tablet (100 mg total) by mouth 2 (two) times daily. 180 tablet 1  . Multiple Vitamin (MULTIVITAMIN) tablet Take 1 tablet by mouth daily.    Marland Kitchen. MYRBETRIQ 25 MG TB24 tablet Take 25 mg by mouth daily.    . traZODone (DESYREL) 50 MG tablet Take 0.5-1 tablets (25-50 mg total) by mouth at bedtime. 30 tablet 3  . cephALEXin (KEFLEX) 500 MG capsule Take 1 capsule (500 mg total) by mouth 2 (two)  times daily. 14 capsule 0   No current facility-administered medications for this visit.   Allergies  Allergen Reactions  . Amlodipine Other (See Comments)    Didn't feel well on 2.5 mg dose.   . Sulfa Antibiotics Rash    Broke out in her joints one time as a teenager      Review of Systems: CONSTITUTIONAL:  No  fever, no chills, No  unintentional weight changes HEAD/EYES/EARS/NOSE/THROAT: No  headache, no vision change, no hearing change, No  sore throat, No  sinus pressure. Scalp laceration keeps bleeding.  CARDIAC: No  chest pain NEUROLOGIC: No  weakness, No  dizziness, No  slurred speech  Exam:  BP 121/78 mmHg  Pulse 84  Ht 5' (1.524 m)  Wt 126 lb (57.153 kg)  BMI 24.61 kg/m2 Constitutional: VS see above. General Appearance: alert, well-developed, well-nourished, NAD Neurological: No cranial nerve deficit on limited exam. Motor and sensation intact and symmetric Skin: Scalp cleaned with peroxide - initialy just mass of matted blood clot and palpable grape-sized hematoma underneath, once cleaned revealed a v-shaped laceration with partially healed borders and necrotic skin tissue atop the hematoma which was removed to leave a 2.6-cm diameter hole. 2 very loose absorbable sutures were removed. Clot/hematoma evacuated. Otherwise no rash/ulcer. No concerning nevi or subq  nodules on limited exam.   Psychiatric: Normal judgment/insight. Normal mood and affect. Oriented x3.    Procedure performed with Dr. Rodney Langton. Scalp cleaned and anesthetized with 6cc Lidocaine after shaving around the laceration. Wound explored and cleaned, no debris or foreign body and no purulent drainage or other signs of infection, skin undermined and brought together with 5 horizontal mattress sutures 0.0 prolene, added dermabond and bacitracin ointment, bandaged. Wound care instructions given. RTC/ER precautions given.    ASSESSMENT/PLAN: Advised patient RTC for recheck in 3 days, wound is already  partially healed, we were reluctant to revise the edges of the wound too much since the skin around the wound already was just barely coming together even after undermining so may need to revise eventually but hopefully wound will close without any problems. Abx given, RTC/ER precautions reviewed.   Scalp laceration, initial encounter - Plan: cephALEXin (KEFLEX) 500 MG capsule   Return in about 3 days (around 08/31/2015), or sooner if needed, for WOUND RECHECK .  Total time spent 40 minutes, greater than 50% of the visit was face-to-face counseling and coordinating care for diagnosis of laceration.

## 2015-08-31 ENCOUNTER — Encounter: Payer: Self-pay | Admitting: Osteopathic Medicine

## 2015-08-31 ENCOUNTER — Telehealth: Payer: Self-pay

## 2015-08-31 ENCOUNTER — Ambulatory Visit (INDEPENDENT_AMBULATORY_CARE_PROVIDER_SITE_OTHER): Payer: Commercial Managed Care - HMO | Admitting: Osteopathic Medicine

## 2015-08-31 VITALS — BP 116/78 | HR 100 | Wt 127.0 lb

## 2015-08-31 DIAGNOSIS — S0101XA Laceration without foreign body of scalp, initial encounter: Secondary | ICD-10-CM

## 2015-08-31 NOTE — Progress Notes (Signed)
HPI: Rachel Keith is a 75 y.o. female who presents to [facility] today for chief complaint of:  Chief Complaint  Patient presents with  . Follow-up    WOUND CHECK     Wound check . Location: Scalp . Quality: A bit sore lots of dried blood, very little pain . Modifying factors: Seen in the office 3 days ago for suture placement after reevaluation of scalp laceration sustained a week ago . Assoc signs/symptoms: No fever or chills, no headache or vision changes  Of note, patient initially did not show for her appointment this morning so we called to check up on her. See nurse's note for full details. There is some concern that the patient was a bit confused, once I got on the phone with her and spoke about our visit on a few days ago, she remembered right away who I was, she said she was given an appointment card to see Dr. Glade Lloyd next week but she wasn't told about her follow-up visit today. We asked her to just come on in. Once in the office, patient was alert and oriented in my judgment. Fall assessment also done by nurse today   Past medical, social and family history reviewed: Past Medical History  Diagnosis Date  . Hypertension    Past Surgical History  Procedure Laterality Date  . Cataract extraction  09/06/2010  . Abdominal hysterectomy    . Appendectomy    . Tonsillectomy    . Cholecystectomy    . Left ankle surgery    . Finger surgery     Social History  Substance Use Topics  . Smoking status: Never Smoker   . Smokeless tobacco: Not on file  . Alcohol Use: 0.0 oz/week    0 drink(s) per week     Comment: occasional   Family History  Problem Relation Age of Onset  .       Current Outpatient Prescriptions  Medication Sig Dispense Refill  . benazepril (LOTENSIN) 40 MG tablet TAKE 1 TABLET (40 MG TOTAL) BY MOUTH DAILY. 90 tablet 2  . cephALEXin (KEFLEX) 500 MG capsule Take 1 capsule (500 mg total) by mouth 2 (two) times daily. 14  capsule 0  . diazepam (VALIUM) 10 MG tablet Take 1 tablet (10 mg total) by mouth daily as needed. 90 tablet 0  . diclofenac sodium (VOLTAREN) 1 % GEL Apply 2 g topically 4 (four) times daily.    Marland Kitchen diltiazem (CARDIZEM SR) 90 MG 12 hr capsule Take 1 capsule (90 mg total) by mouth every 12 (twelve) hours. 60 capsule 5  . ELIQUIS 5 MG TABS tablet TAKE 1 TABLET BY MOUTH TWICE A DAY (Patient taking differently: .5 tab once a day) 60 tablet 0  . estradiol (ESTRACE) 2 MG tablet Take 1 tablet (2 mg total) by mouth daily. 90 tablet 1  . levothyroxine (SYNTHROID, LEVOTHROID) 150 MCG tablet Take 1 tablet (150 mcg total) by mouth daily. 90 tablet 1  . metoprolol (LOPRESSOR) 100 MG tablet Take 1 tablet (100 mg total) by mouth 2 (two) times daily. 180 tablet 1  . Multiple Vitamin (MULTIVITAMIN) tablet Take 1 tablet by mouth daily.    Marland Kitchen MYRBETRIQ 25 MG TB24 tablet Take 25 mg by mouth daily.    . traZODone (DESYREL) 50 MG tablet Take 0.5-1 tablets (25-50 mg total) by mouth at bedtime. 30 tablet 3   No current facility-administered medications for this visit.   Allergies  Allergen Reactions  . Amlodipine  Other (See Comments)    Didn't feel well on 2.5 mg dose.   . Sulfa Antibiotics Rash    Broke out in her joints one time as a teenager      Review of Systems: CONSTITUTIONAL:  No  fever, no chills, No  unintentional weight changes HEAD/EYES/EARS/NOSE/THROAT: No  headache, no vision change CARDIAC: No  chest pain, SKIN: No  rash/wounds/concerning lesions, sutures/laceration or relatively nonpainful   Exam:  BP 116/78 mmHg  Pulse 100  Wt 127 lb (57.607 kg) Constitutional: VS see above. General Appearance: alert, well-developed, well-nourished, NAD Skin: Skin was cleaned with peroxide and 4 x 4's to remove dried crusted blood, sutures are in place, wound is closed, Dermabond is over the sutures. Wound was redressed with antibiotic ointment, nonstick dressing and bandages . Skin is otherwise warm, dry,  intact. No rash/ulcer. No concerning nevi or subq nodules on limited exam.   Psychiatric: Normal judgment/insight. Normal mood and affect. Oriented x3.     ASSESSMENT/PLAN:  Scalp laceration, initial encounter - Wound is closed, sutures in place. Advised patient fill out records release so we can obtain ER report from when she was initially evaluated   Return in about 7 days (around 09/07/2015), or sooner if needed, for WOUND RECHECK - SUTURE REMOVAL .

## 2015-08-31 NOTE — Telephone Encounter (Signed)
Called patient to make sure everything was ok because she had a scheduled appointment for 9:00 this morning and she didn't show. I did not get an answer on the first attempt so I left a voicemail. 10 minutes later I attempted to call patient again and she answered. I asked her if she was ok and did she know  She had an appointment today. Patient sounded a little confused but I got Dr. Lyn HollingsheadAlexander to speak with the patient and she did come in later this morning to get wound checked. Rhonda Cunningham,CMA

## 2015-09-03 ENCOUNTER — Ambulatory Visit (INDEPENDENT_AMBULATORY_CARE_PROVIDER_SITE_OTHER): Payer: Commercial Managed Care - HMO | Admitting: Osteopathic Medicine

## 2015-09-03 ENCOUNTER — Other Ambulatory Visit: Payer: Self-pay | Admitting: Osteopathic Medicine

## 2015-09-03 ENCOUNTER — Ambulatory Visit: Payer: Commercial Managed Care - HMO | Admitting: Osteopathic Medicine

## 2015-09-03 ENCOUNTER — Encounter: Payer: Self-pay | Admitting: Osteopathic Medicine

## 2015-09-03 VITALS — BP 127/89 | HR 100 | Wt 131.0 lb

## 2015-09-03 DIAGNOSIS — B373 Candidiasis of vulva and vagina: Secondary | ICD-10-CM | POA: Diagnosis not present

## 2015-09-03 DIAGNOSIS — R319 Hematuria, unspecified: Secondary | ICD-10-CM

## 2015-09-03 DIAGNOSIS — N760 Acute vaginitis: Secondary | ICD-10-CM | POA: Diagnosis not present

## 2015-09-03 DIAGNOSIS — N3090 Cystitis, unspecified without hematuria: Secondary | ICD-10-CM | POA: Diagnosis not present

## 2015-09-03 DIAGNOSIS — B3731 Acute candidiasis of vulva and vagina: Secondary | ICD-10-CM

## 2015-09-03 LAB — POCT URINALYSIS DIPSTICK
Glucose, UA: NEGATIVE
KETONES UA: NEGATIVE
LEUKOCYTES UA: NEGATIVE
NITRITE UA: NEGATIVE
PH UA: 6
PROTEIN UA: 100
Spec Grav, UA: >=1.03
UROBILINOGEN UA: 0.2

## 2015-09-03 MED ORDER — FLUCONAZOLE 150 MG PO TABS: ORAL_TABLET | ORAL | Status: DC

## 2015-09-03 NOTE — Progress Notes (Signed)
HPI: Rachel Keith is a 75 y.o. female who presents to [facility] today for chief complaint of:  Chief Complaint  Patient presents with  . Hematuria     VAGINAL ITCHING . Quality: vaginal itching and blood in urine . Duration: 3-4 days  . Timing: worse with going to bathroom, wiping herself and then starts itching really bad . Context: s/p "total hysterectomy," on estrogen HRT, not sexually active  . Assoc signs/symptoms: urinary incontinence, history of kidney stones   Past medical, social and family history reviewed: Past Medical History  Diagnosis Date  . Hypertension    Past Surgical History  Procedure Laterality Date  . Cataract extraction  09/06/2010  . Abdominal hysterectomy    . Appendectomy    . Tonsillectomy    . Cholecystectomy    . Left ankle surgery    . Finger surgery     Social History  Substance Use Topics  . Smoking status: Never Smoker   . Smokeless tobacco: Not on file  . Alcohol Use: 0.0 oz/week    0 drink(s) per week     Comment: occasional   Family History  Problem Relation Age of Onset  .       Current Outpatient Prescriptions  Medication Sig Dispense Refill  . benazepril (LOTENSIN) 40 MG tablet TAKE 1 TABLET (40 MG TOTAL) BY MOUTH DAILY. 90 tablet 2  . cephALEXin (KEFLEX) 500 MG capsule Take 1 capsule (500 mg total) by mouth 2 (two) times daily. 14 capsule 0  . diazepam (VALIUM) 10 MG tablet Take 1 tablet (10 mg total) by mouth daily as needed. 90 tablet 0  . diclofenac sodium (VOLTAREN) 1 % GEL Apply 2 g topically 4 (four) times daily.    Marland Kitchen diltiazem (CARDIZEM SR) 90 MG 12 hr capsule Take 1 capsule (90 mg total) by mouth every 12 (twelve) hours. 60 capsule 5  . ELIQUIS 5 MG TABS tablet TAKE 1 TABLET BY MOUTH TWICE A DAY (Patient taking differently: .5 tab once a day) 60 tablet 0  . estradiol (ESTRACE) 2 MG tablet Take 1 tablet (2 mg total) by mouth daily. 90 tablet 1  . levothyroxine (SYNTHROID, LEVOTHROID)  150 MCG tablet Take 1 tablet (150 mcg total) by mouth daily. 90 tablet 1  . metoprolol (LOPRESSOR) 100 MG tablet Take 1 tablet (100 mg total) by mouth 2 (two) times daily. 180 tablet 1  . Multiple Vitamin (MULTIVITAMIN) tablet Take 1 tablet by mouth daily.    Marland Kitchen MYRBETRIQ 25 MG TB24 tablet Take 25 mg by mouth daily.    . traZODone (DESYREL) 50 MG tablet Take 0.5-1 tablets (25-50 mg total) by mouth at bedtime. 30 tablet 3   No current facility-administered medications for this visit.   Allergies  Allergen Reactions  . Amlodipine Other (See Comments)    Didn't feel well on 2.5 mg dose.   . Sulfa Antibiotics Rash    Broke out in her joints one time as a teenager      Review of Systems: CONSTITUTIONAL:  No  fever, no chills HEAD/EYES/EARS/NOSE/THROAT: No  headache, no vision change, CARDIAC: No  chest pain GASTROINTESTINAL: No  nausea, No  vomiting, No  abdominal pain, No  blood in stool, No  diarrhea, No  constipation  MUSCULOSKELETAL: No  myalgia/arthralgia GENITOURINARY: (+) incontinence, (+) abnormal genital bleeding/itching/discharge SKIN: No  rash/wounds/concerning lesions noted by patient HEM/ONC: (+) easy bruising/bleeding on blood-thinners    Exam:  BP 127/89 mmHg  Pulse 100  Wt 131 lb (59.421 kg) Constitutional: VS see above. General Appearance: alert, well-developed, well-nourished, NAD Respiratory: Normal respiratory Skin: warm, dry, intact. No rash/ulcer. No concerning nevi or subq nodules on limited exam.  GYN: No lesions/ulcers to external genitalia, normal urethra, atrophic vaginal mucosa, physiologic discharge, vulvar skin (+) erythema but no ulceration or drainage, debris consistent with toilet paper detritus is caked on skin between labia minora and majora.  Psychiatric: Normal judgment/insight. Normal mood and affect. Oriented x3.    Results for orders placed or performed in visit on 09/03/15 (from the past 72 hour(s))  POCT Urinalysis Dipstick     Status: None    Collection Time: 09/03/15 11:04 AM  Result Value Ref Range   Color, UA RED    Clarity, UA CLOUDY    Glucose, UA NEG    Bilirubin, UA SMALL    Ketones, UA NEGATIVE    Spec Grav, UA >=1.030    Blood, UA LARGE    pH, UA 6.0    Protein, UA 100    Urobilinogen, UA 0.2    Nitrite, UA NEGATIVE    Leukocytes, UA Negative Negative      ASSESSMENT/PLAN: Hematuria most likely due to trauma from itching vs renal stones vs other will send for UCx and Micro UA. Treat with diflucan and await wet prep. Hygiene practices advised. F/u as directed with PCP.   Blood in urine - Plan: POCT Urinalysis Dipstick, Urine Culture  Vaginal yeast infection - Plan: fluconazole (DIFLUCAN) 150 MG tablet  Vaginitis and vulvovaginitis    Return if symptoms worsen or fail to improve - also follow with Dr. Linford ArnoldMetheney 4/26 and Dr. Lyn HollingsheadAlexander 4/28.

## 2015-09-03 NOTE — Addendum Note (Signed)
Addended by: Pixie CasinoUNNINGHAM, RHONDA C on: 09/03/2015 11:53 AM   Modules accepted: Orders

## 2015-09-03 NOTE — Patient Instructions (Signed)

## 2015-09-04 LAB — WET PREP FOR TRICH, YEAST, CLUE
CLUE CELLS WET PREP: NONE SEEN
TRICH WET PREP: NONE SEEN
WBC, Wet Prep HPF POC: NONE SEEN

## 2015-09-04 LAB — URINALYSIS, MICROSCOPIC ONLY
CASTS: NONE SEEN [LPF]
Yeast: NONE SEEN [HPF]

## 2015-09-04 MED ORDER — CIPROFLOXACIN HCL 500 MG PO TABS: 500.0000 mg | ORAL_TABLET | Freq: Two times a day (BID) | ORAL | Status: DC

## 2015-09-04 NOTE — Addendum Note (Signed)
Addended by: Deirdre PippinsALEXANDER, Kimberley Dastrup M on: 09/04/2015 01:34 PM   Modules accepted: Orders

## 2015-09-05 ENCOUNTER — Telehealth: Payer: Self-pay | Admitting: Family Medicine

## 2015-09-05 ENCOUNTER — Ambulatory Visit (INDEPENDENT_AMBULATORY_CARE_PROVIDER_SITE_OTHER): Payer: Commercial Managed Care - HMO | Admitting: Family Medicine

## 2015-09-05 VITALS — BP 111/65 | HR 84 | Wt 129.0 lb

## 2015-09-05 DIAGNOSIS — I4819 Other persistent atrial fibrillation: Secondary | ICD-10-CM

## 2015-09-05 DIAGNOSIS — N393 Stress incontinence (female) (male): Secondary | ICD-10-CM

## 2015-09-05 DIAGNOSIS — I481 Persistent atrial fibrillation: Secondary | ICD-10-CM

## 2015-09-05 DIAGNOSIS — N760 Acute vaginitis: Secondary | ICD-10-CM | POA: Diagnosis not present

## 2015-09-05 DIAGNOSIS — S0101XA Laceration without foreign body of scalp, initial encounter: Secondary | ICD-10-CM

## 2015-09-05 DIAGNOSIS — N132 Hydronephrosis with renal and ureteral calculous obstruction: Secondary | ICD-10-CM

## 2015-09-05 DIAGNOSIS — R32 Unspecified urinary incontinence: Secondary | ICD-10-CM

## 2015-09-05 DIAGNOSIS — R42 Dizziness and giddiness: Secondary | ICD-10-CM

## 2015-09-05 DIAGNOSIS — B3731 Acute candidiasis of vulva and vagina: Secondary | ICD-10-CM

## 2015-09-05 DIAGNOSIS — Z87442 Personal history of urinary calculi: Secondary | ICD-10-CM

## 2015-09-05 DIAGNOSIS — B373 Candidiasis of vulva and vagina: Secondary | ICD-10-CM | POA: Diagnosis not present

## 2015-09-05 NOTE — Progress Notes (Signed)
Subjective:    Patient ID: Rachel Keith, female    DOB: 07-22-1940, 75 y.o.   MRN: [id number]  HPI Patient was seen in the office 2 days ago for urinary and vaginal symptoms. She was diagnosed with yeast vaginitis. She also had a urinalysis performed and culture is pending. She is currently on Keflex.  Gross hematuria-they are still having her split her Eliquis currently.  Atrial fibrillation- she feels like the diltiazem has made he light headed.    Recent syncopal episode. Unfortunately she expresses a laceration to the scalp. She came in last week. She is actually due to come in on Friday to have the sutures removed. She is feeling better. She said she started feeling a little off after several of her medications were stopped recently. She just currently on diltiazem and metoprolol for rate control for her atrial fibrillation. She says in fact the benazepril was stopped.  Is also recently suffered from multiple urinary tract stones which was causing some gross hematuria that she was experiencing. She says she still passing what looks like sand or small granules. Her stones were eventually removed. She said she did not undergo any type of lithotripsy. Her urologist is Dr. Edwyna Shell. She said several of the stones were sent for stone analysis but she has not heard back from their office on those results yet. Next  She still struggling with urinary incontinence. She says she doesn't even get the urge to urinate and her bladder just leaks. At one time the urologist had discussed a mesh. She is not interested in any type of surgical approach.  Review of Systems     Objective:   Physical Exam  Constitutional: She is oriented to person, place, and time. She appears well-developed and well-nourished.  HENT:  Head: Normocephalic and atraumatic.  Cardiovascular: Normal rate, regular rhythm and normal heart sounds.   Pulmonary/Chest: Effort normal and breath sounds normal.  Neurological: She is alert  and oriented to person, place, and time.  Skin: Skin is warm and dry.  Laceration on the scalp appears to be healing well. No surrounding erythema or induration. She does have a large scab on the surface. Recommended that she start putting Vaseline on it over the next couple days before her suture removal on Friday.  Psychiatric: She has a normal mood and affect. Her behavior is normal.        Assessment & Plan:  Yeast vaginitis-she has picked up the prescription and started taking it.  Urinary tract infection-he is on Keflex currently. Urine culture still pending.  Urinary tract stone-we'll try to contact her port for the stone analysis. She would really like to know the results of this. Next  Syncopal episode with some intermittent lightheadedness-blood pressures on the lower in today so certainly this could be contributing. I believe she is not actually taking the benazepril anymore simply go ahead and remove that from her medication list. Right now she is on higher dose of metoprolol and diltiazem to help control her heart rate and she is very symptomatic when her heart rate is over 100. Next  Urinary incontinence-actually think she would be a good candidate for a possible vaginal pessary. We discussed this in the office today and I gave her an additional handout to review. If she thinks she might be interested in discussing this as an option that I would recommend that we set her up with one of our gynecologists down the hall. She will think about it and let  me know.

## 2015-09-05 NOTE — Telephone Encounter (Signed)
Pt informed of recommendations.Joeanna Howdyshell [name]  

## 2015-09-05 NOTE — Telephone Encounter (Signed)
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 The main way to prevent diseases to really increase fluid intake particularly water. It is okay to drink tea and coffee as well. Also she may want to reduce animal protein and get her protein more from plants or vegetables such as beans etc. Increasing fruit and vegetable intake helps as well. Limiting salt and sugary foods has some good evidence to. Also she needs to make sure she is getting adequate calcium in her diet such as dairy products but avoid taking a pill form of a calcium supplement.

## 2015-09-07 ENCOUNTER — Ambulatory Visit (INDEPENDENT_AMBULATORY_CARE_PROVIDER_SITE_OTHER): Payer: Commercial Managed Care - HMO | Admitting: Osteopathic Medicine

## 2015-09-07 ENCOUNTER — Encounter: Payer: Self-pay | Admitting: Osteopathic Medicine

## 2015-09-07 VITALS — BP 109/77 | HR 88 | Ht 60.0 in | Wt 130.0 lb

## 2015-09-07 DIAGNOSIS — B3731 Acute candidiasis of vulva and vagina: Secondary | ICD-10-CM

## 2015-09-07 DIAGNOSIS — B373 Candidiasis of vulva and vagina: Secondary | ICD-10-CM

## 2015-09-07 DIAGNOSIS — S0101XA Laceration without foreign body of scalp, initial encounter: Secondary | ICD-10-CM | POA: Diagnosis not present

## 2015-09-07 LAB — URINE CULTURE: Colony Count: 80000

## 2015-09-07 MED ORDER — FLUCONAZOLE 150 MG PO TABS: ORAL_TABLET | ORAL | Status: DC

## 2015-09-07 NOTE — Patient Instructions (Signed)

## 2015-09-07 NOTE — Progress Notes (Signed)
HPI: Rachel Keith is a 75 y.o. female who presents to [facility] today for chief complaint of:  Chief Complaint  Patient presents with  . Suture / Staple Removal    Request  refill for Diflucan     Wound check . Location: Scalp . Quality:itchiy but no other complaints . Modifying factors: Seen in the office 10 days ago for suture placement after reevaluation of scalp laceration sustained a week prior. Seen for wound check 7 days ago and here now for suture removal.  . Assoc signs/symptoms: No fever or chills, no headache or vision changes   Past medical, social and family history reviewed: Past Medical History  Diagnosis Date  . Hypertension    Past Surgical History  Procedure Laterality Date  . Cataract extraction  09/06/2010  . Abdominal hysterectomy    . Appendectomy    . Tonsillectomy    . Cholecystectomy    . Left ankle surgery    . Finger surgery     Social History  Substance Use Topics  . Smoking status: Never Smoker   . Smokeless tobacco: Not on file  . Alcohol Use: 0.0 oz/week    0 drink(s) per week     Comment: occasional   Family History  Problem Relation Age of Onset  .       Current Outpatient Prescriptions  Medication Sig Dispense Refill  . diazepam (VALIUM) 10 MG tablet Take 1 tablet (10 mg total) by mouth daily as needed. 90 tablet 0  . diclofenac sodium (VOLTAREN) 1 % GEL Apply 2 g topically 4 (four) times daily.    Marland Kitchen diltiazem (CARDIZEM SR) 90 MG 12 hr capsule Take 1 capsule (90 mg total) by mouth every 12 (twelve) hours. 60 capsule 5  . ELIQUIS 5 MG TABS tablet TAKE 1 TABLET BY MOUTH TWICE A DAY (Patient taking differently: .5 tab once a day) 60 tablet 0  . estradiol (ESTRACE) 2 MG tablet Take 1 tablet (2 mg total) by mouth daily. 90 tablet 1  . fluconazole (DIFLUCAN) 150 MG tablet Take 1 tablet today and repeat dose in 48-72 hours if symptoms have not resolved 2 tablet 1  . levothyroxine (SYNTHROID, LEVOTHROID)  150 MCG tablet Take 1 tablet (150 mcg total) by mouth daily. 90 tablet 1  . metoprolol (LOPRESSOR) 100 MG tablet Take 1 tablet (100 mg total) by mouth 2 (two) times daily. 180 tablet 1  . Multiple Vitamin (MULTIVITAMIN) tablet Take 1 tablet by mouth daily.    Marland Kitchen MYRBETRIQ 25 MG TB24 tablet Take 25 mg by mouth daily.    . traZODone (DESYREL) 50 MG tablet Take 0.5-1 tablets (25-50 mg total) by mouth at bedtime. 30 tablet 3   No current facility-administered medications for this visit.   Allergies  Allergen Reactions  . Amlodipine Other (See Comments)    Didn't feel well on 2.5 mg dose.   . Sulfa Antibiotics Rash    Broke out in her joints one time as a teenager      Review of Systems: CONSTITUTIONAL:  No  fever, no chills, No  unintentional weight changes HEAD/EYES/EARS/NOSE/THROAT: No  headache, no vision change CARDIAC: No  chest pain, SKIN: No  rash/wounds/concerning lesions, sutures/laceration or relatively nonpainful but itching her   Exam:  BP 109/77 mmHg  Pulse 88  Ht 5' (1.524 m)  Wt 130 lb (58.968 kg)  BMI 25.39 kg/m2 Constitutional: VS see above. General Appearance: alert, well-developed, well-nourished, NAD Skin: Skin was, sutures  are in place and were removed without difficulty, wound is open centrally in about 0.5cm diameter but granulation tissue is present. Wound was redressed with antibiotic ointment, and we-to-dry dressing dressing and medical tape. Skin is otherwise warm, dry, intact. No rash/ulcer.  Psychiatric: Normal judgment/insight. Normal mood and affect. Oriented x3.     ASSESSMENT/PLAN:  Scalp laceration, initial encounter  Vaginal yeast infection - Plan: fluconazole (DIFLUCAN) 150 MG tablet   Return in about 1 week (around 09/14/2015) for WOUND RECHECK WITH DR. Denyse AmassOREY.

## 2015-09-10 ENCOUNTER — Encounter: Payer: Self-pay | Admitting: Family Medicine

## 2015-09-10 ENCOUNTER — Other Ambulatory Visit: Payer: Self-pay | Admitting: Osteopathic Medicine

## 2015-09-10 ENCOUNTER — Other Ambulatory Visit: Payer: Self-pay | Admitting: Family Medicine

## 2015-09-11 ENCOUNTER — Other Ambulatory Visit: Payer: Self-pay | Admitting: Family Medicine

## 2015-09-11 ENCOUNTER — Telehealth: Payer: Self-pay | Admitting: Family Medicine

## 2015-09-11 NOTE — Telephone Encounter (Signed)
Received a call from [facility] and patient is scheduled with Dr. Leeann Mustenaldo on 09/12/15. I sent referral through [employer] and received authorization [id number] good for 6 visits from 09/11/15 - 03/12/16. I faxed [facility] to [facility] at [phone]. - CF

## 2015-09-13 ENCOUNTER — Encounter: Payer: Self-pay | Admitting: Family Medicine

## 2015-09-13 ENCOUNTER — Ambulatory Visit (INDEPENDENT_AMBULATORY_CARE_PROVIDER_SITE_OTHER): Payer: Commercial Managed Care - HMO | Admitting: Family Medicine

## 2015-09-13 ENCOUNTER — Observation Stay (HOSPITAL_COMMUNITY)
Admission: EM | Admit: 2015-09-13 | Discharge: 2015-09-16 | Disposition: A | Discharge status: Home / Self Care | Payer: Commercial Managed Care - HMO | Attending: Internal Medicine | Admitting: Internal Medicine

## 2015-09-13 ENCOUNTER — Ambulatory Visit (INDEPENDENT_AMBULATORY_CARE_PROVIDER_SITE_OTHER): Payer: Commercial Managed Care - HMO

## 2015-09-13 ENCOUNTER — Encounter (HOSPITAL_COMMUNITY): Payer: Self-pay | Admitting: Emergency Medicine

## 2015-09-13 VITALS — BP 116/69 | HR 69 | Wt 134.0 lb

## 2015-09-13 DIAGNOSIS — I517 Cardiomegaly: Secondary | ICD-10-CM

## 2015-09-13 DIAGNOSIS — R413 Other amnesia: Secondary | ICD-10-CM | POA: Insufficient documentation

## 2015-09-13 DIAGNOSIS — I509 Heart failure, unspecified: Secondary | ICD-10-CM | POA: Diagnosis present

## 2015-09-13 DIAGNOSIS — I482 Chronic atrial fibrillation, unspecified: Secondary | ICD-10-CM | POA: Diagnosis present

## 2015-09-13 DIAGNOSIS — E039 Hypothyroidism, unspecified: Secondary | ICD-10-CM | POA: Diagnosis present

## 2015-09-13 DIAGNOSIS — I10 Essential (primary) hypertension: Secondary | ICD-10-CM | POA: Diagnosis present

## 2015-09-13 DIAGNOSIS — J90 Pleural effusion, not elsewhere classified: Secondary | ICD-10-CM | POA: Diagnosis present

## 2015-09-13 DIAGNOSIS — E785 Hyperlipidemia, unspecified: Secondary | ICD-10-CM | POA: Diagnosis present

## 2015-09-13 DIAGNOSIS — R319 Hematuria, unspecified: Secondary | ICD-10-CM

## 2015-09-13 DIAGNOSIS — Z79899 Other long term (current) drug therapy: Secondary | ICD-10-CM | POA: Insufficient documentation

## 2015-09-13 DIAGNOSIS — I130 Hypertensive heart and chronic kidney disease with heart failure and stage 1 through stage 4 chronic kidney disease, or unspecified chronic kidney disease: Principal | ICD-10-CM | POA: Insufficient documentation

## 2015-09-13 DIAGNOSIS — R0902 Hypoxemia: Secondary | ICD-10-CM

## 2015-09-13 DIAGNOSIS — Z7901 Long term (current) use of anticoagulants: Secondary | ICD-10-CM | POA: Insufficient documentation

## 2015-09-13 DIAGNOSIS — N189 Chronic kidney disease, unspecified: Secondary | ICD-10-CM | POA: Insufficient documentation

## 2015-09-13 DIAGNOSIS — F419 Anxiety disorder, unspecified: Secondary | ICD-10-CM | POA: Diagnosis present

## 2015-09-13 DIAGNOSIS — W19XXXA Unspecified fall, initial encounter: Secondary | ICD-10-CM | POA: Insufficient documentation

## 2015-09-13 DIAGNOSIS — N183 Chronic kidney disease, stage 3 unspecified: Secondary | ICD-10-CM | POA: Diagnosis present

## 2015-09-13 DIAGNOSIS — D649 Anemia, unspecified: Secondary | ICD-10-CM | POA: Diagnosis present

## 2015-09-13 HISTORY — DX: Unspecified atrial fibrillation: I48.91

## 2015-09-13 HISTORY — DX: Anemia, unspecified: D64.9

## 2015-09-13 HISTORY — DX: Hypothyroidism, unspecified: E03.9

## 2015-09-13 HISTORY — DX: Chronic kidney disease, stage 3 (moderate): N18.3

## 2015-09-13 HISTORY — DX: Hematuria, unspecified: R31.9

## 2015-09-13 HISTORY — DX: Personal history of other medical treatment: Z92.89

## 2015-09-13 HISTORY — DX: Chronic kidney disease, stage 3 unspecified: N18.30

## 2015-09-13 HISTORY — DX: Anxiety disorder, unspecified: F41.9

## 2015-09-13 HISTORY — DX: Pure hypercholesterolemia, unspecified: E78.00

## 2015-09-13 LAB — BASIC METABOLIC PANEL
ANION GAP: 12 (ref 5–15)
BUN: 17 mg/dL (ref 6–20)
CALCIUM: 9 mg/dL (ref 8.9–10.3)
CO2: 21 mmol/L — ABNORMAL LOW (ref 22–32)
CREATININE: 1.16 mg/dL — AB (ref 0.44–1.00)
Chloride: 112 mmol/L — ABNORMAL HIGH (ref 101–111)
GFR calc Af Amer: 52 mL/min — ABNORMAL LOW (ref >60)
GFR, EST NON AFRICAN AMERICAN: 45 mL/min — AB (ref >60)
GLUCOSE: 100 mg/dL — AB (ref 65–99)
Potassium: 4.3 mmol/L (ref 3.5–5.1)
Sodium: 145 mmol/L (ref 135–145)

## 2015-09-13 LAB — CBC WITH DIFFERENTIAL/PLATELET
Basophils Absolute: 0.1 10*3/uL (ref 0.0–0.1)
Basophils Relative: 1 %
EOS ABS: 0.2 10*3/uL (ref 0.0–0.7)
EOS PCT: 2 %
HEMATOCRIT: 33.6 % — AB (ref 36.0–46.0)
HEMOGLOBIN: 10.7 g/dL — AB (ref 12.0–15.0)
Lymphocytes Relative: 27 %
Lymphs Abs: 1.8 10*3/uL (ref 0.7–4.0)
MCH: 30.5 pg (ref 26.0–34.0)
MCHC: 31.8 g/dL (ref 30.0–36.0)
MCV: 95.7 fL (ref 78.0–100.0)
MONO ABS: 0.7 10*3/uL (ref 0.1–1.0)
Monocytes Relative: 10 %
NEUTROS ABS: 4 10*3/uL (ref 1.7–7.7)
Neutrophils Relative %: 60 %
Platelets: 120 10*3/uL — ABNORMAL LOW (ref 150–400)
RBC: 3.51 MIL/uL — ABNORMAL LOW (ref 3.87–5.11)
RDW: 14.6 % (ref 11.5–15.5)
WBC: 6.7 10*3/uL (ref 4.0–10.5)

## 2015-09-13 LAB — URINALYSIS, ROUTINE W REFLEX MICROSCOPIC
Bilirubin Urine: NEGATIVE
GLUCOSE, UA: NEGATIVE mg/dL
Ketones, ur: NEGATIVE mg/dL
Nitrite: NEGATIVE
PH: 5.5 (ref 5.0–8.0)
PROTEIN: 30 mg/dL — AB
SPECIFIC GRAVITY, URINE: 1.012 (ref 1.005–1.030)

## 2015-09-13 LAB — TROPONIN I: Troponin I: <0.03 ng/mL (ref <0.031)

## 2015-09-13 LAB — URINE MICROSCOPIC-ADD ON

## 2015-09-13 LAB — HEMATOCRIT: HCT: 31.7 % — ABNORMAL LOW (ref 36.0–46.0)

## 2015-09-13 LAB — HEMOGLOBIN: HEMOGLOBIN: 10.1 g/dL — AB (ref 12.0–15.0)

## 2015-09-13 LAB — BRAIN NATRIURETIC PEPTIDE: B NATRIURETIC PEPTIDE 5: 222.8 pg/mL — AB (ref 0.0–100.0)

## 2015-09-13 MED ORDER — FUROSEMIDE 10 MG/ML IJ SOLN
40.0000 mg | Freq: Once | INTRAMUSCULAR | Status: AC
  Administered 2015-09-13: 40 mg via INTRAVENOUS
  Filled 2015-09-13: qty 4

## 2015-09-13 MED ORDER — FUROSEMIDE 10 MG/ML IJ SOLN
40.0000 mg | Freq: Two times a day (BID) | INTRAMUSCULAR | Status: DC
  Administered 2015-09-14 – 2015-09-15 (×2): 40 mg via INTRAVENOUS
  Filled 2015-09-13 (×4): qty 4

## 2015-09-13 MED ORDER — ESTRADIOL 2 MG PO TABS
2.0000 mg | ORAL_TABLET | Freq: Every day | ORAL | Status: DC
  Administered 2015-09-14 – 2015-09-16 (×3): 2 mg via ORAL
  Filled 2015-09-13 (×3): qty 1

## 2015-09-13 MED ORDER — TRAZODONE HCL 50 MG PO TABS: 25.0000 mg | ORAL_TABLET | Freq: Every day | ORAL | Status: DC

## 2015-09-13 MED ORDER — DIAZEPAM 5 MG PO TABS
10.0000 mg | ORAL_TABLET | Freq: Every day | ORAL | Status: DC | PRN
  Administered 2015-09-15: 10 mg via ORAL
  Filled 2015-09-13: qty 2

## 2015-09-13 MED ORDER — DICLOFENAC SODIUM 1 % TD GEL
2.0000 g | Freq: Four times a day (QID) | TRANSDERMAL | Status: DC
  Filled 2015-09-13 (×2): qty 100

## 2015-09-13 MED ORDER — SODIUM CHLORIDE 0.9% FLUSH
3.0000 mL | Freq: Two times a day (BID) | INTRAVENOUS | Status: DC
  Administered 2015-09-13 – 2015-09-16 (×6): 3 mL via INTRAVENOUS

## 2015-09-13 MED ORDER — ADULT MULTIVITAMIN W/MINERALS CH
1.0000 | ORAL_TABLET | Freq: Every day | ORAL | Status: DC
  Administered 2015-09-14 – 2015-09-16 (×3): 1 via ORAL
  Filled 2015-09-13 (×4): qty 1

## 2015-09-13 MED ORDER — SODIUM CHLORIDE 0.9 % IV SOLN: 250.0000 mL | INTRAVENOUS | Status: DC | PRN

## 2015-09-13 MED ORDER — SODIUM CHLORIDE 0.9% FLUSH: 3.0000 mL | INTRAVENOUS | Status: DC | PRN

## 2015-09-13 MED ORDER — ATENOLOL 50 MG PO TABS
50.0000 mg | ORAL_TABLET | Freq: Every day | ORAL | Status: DC
  Administered 2015-09-14 – 2015-09-16 (×3): 50 mg via ORAL
  Filled 2015-09-13 (×3): qty 1

## 2015-09-13 MED ORDER — ENSURE ENLIVE PO LIQD
237.0000 mL | Freq: Two times a day (BID) | ORAL | Status: DC
  Administered 2015-09-15 (×2): 237 mL via ORAL

## 2015-09-13 MED ORDER — TRAZODONE HCL 50 MG PO TABS
50.0000 mg | ORAL_TABLET | Freq: Every day | ORAL | Status: DC
  Administered 2015-09-13 – 2015-09-14 (×2): 50 mg via ORAL
  Filled 2015-09-13 (×2): qty 1

## 2015-09-13 MED ORDER — MIRABEGRON ER 25 MG PO TB24
25.0000 mg | ORAL_TABLET | Freq: Every day | ORAL | Status: DC
  Administered 2015-09-14 – 2015-09-16 (×3): 25 mg via ORAL
  Filled 2015-09-13 (×3): qty 1

## 2015-09-13 MED ORDER — LEVOTHYROXINE SODIUM 75 MCG PO TABS
150.0000 ug | ORAL_TABLET | Freq: Every day | ORAL | Status: DC
  Administered 2015-09-14 – 2015-09-16 (×3): 150 ug via ORAL
  Filled 2015-09-13 (×3): qty 2

## 2015-09-13 MED ORDER — POTASSIUM CHLORIDE 20 MEQ/15ML (10%) PO SOLN
20.0000 meq | Freq: Three times a day (TID) | ORAL | Status: DC
  Administered 2015-09-13 – 2015-09-14 (×2): 20 meq via ORAL
  Filled 2015-09-13 (×2): qty 15

## 2015-09-13 NOTE — Progress Notes (Signed)
Pt admit for CHF exacerbation, pt oriented to unit, VSS, pt a/o, no c/o pain, pt stable

## 2015-09-13 NOTE — ED Notes (Signed)
[facility] Patient to ED from home c/o SOB x 1 month. [facility] for same complaint and was d/c home. [facility] this [facility] for increased SOB and d/c home again - once home, she got a call from the doctor at the ED and was told to come back because her CXR showed fluid in her lungs. HX A-Fib, HTN. Pt c/o SOB on light exertion. EMS VS: 95% RA, 100% 2L O2, HR 68 A-Fib. Pt A&O x 4.

## 2015-09-13 NOTE — Progress Notes (Addendum)
Rachel Keith is a 75 y.o. female who presents to [facility] today for follow-up scalp laceration.  Patient has been seen several times by Dr. Lyn Keith for laceration of the posterior scalp. She's here for recheck. She feels well with this issue and denies any bleeding or pain.  However the patient does note worsening shortness of breath. This is been ongoing for some time now. She is being [facility]. She has a history of atrial fibrillation tricuspid and mitral valve regurgitation and pulmonary hypertension. She denies any chest pains or palpitations but does note significant activity limiting dyspnea on exertion. She is only able to go with the stairs before she has to stop and rest. She is worried she may have COPD or some other explanation for her dyspnea. She notes today is a pretty typical day and not any worse or better than usual. She denies wheezing or cough. She notes she was [facility] who changed several medicines. She is not sure which got changed and will bring the pills at the next visit. She is not satisfied with this cardiologist would like to switch to a different cardiologist if possible.   Past Medical History  Diagnosis Date  . Hypertension    Past Surgical History  Procedure Laterality Date  . Cataract extraction  09/06/2010  . Abdominal hysterectomy    . Appendectomy    . Tonsillectomy    . Cholecystectomy    . Left ankle surgery    . Finger surgery     Social History  Substance Use Topics  . Smoking status: Never Smoker   . Smokeless tobacco: Not on file  . Alcohol Use: 0.0 oz/week    0 drink(s) per week     Comment: occasional   family history is not on file.  ROS as above Medications: Current Outpatient Prescriptions  Medication Sig Dispense Refill  . diazepam (VALIUM) 10 MG tablet Take 1 tablet  (10 mg total) by mouth daily as needed. 90 tablet 0  . diclofenac sodium (VOLTAREN) 1 % GEL Apply 2 g topically 4 (four) times daily.    Marland Kitchen. diltiazem (CARDIZEM SR) 90 MG 12 hr capsule Take 1 capsule (90 mg total) by mouth every 12 (twelve) hours. 60 capsule 5  . ELIQUIS 5 MG TABS tablet TAKE 1 TABLET BY MOUTH TWICE A DAY 60 tablet 0  . estradiol (ESTRACE) 2 MG tablet Take 1 tablet (2 mg total) by mouth daily. 90 tablet 1  . levothyroxine (SYNTHROID, LEVOTHROID) 150 MCG tablet Take 1 tablet (150 mcg total) by mouth daily. 90 tablet 1  . metoprolol (LOPRESSOR) 100 MG tablet Take 1 tablet (100 mg total) by mouth 2 (two) times daily. 180 tablet 1  . Multiple Vitamin (MULTIVITAMIN) tablet Take 1 tablet by mouth daily.    Marland Kitchen. MYRBETRIQ 25 MG TB24 tablet Take 25 mg by mouth daily.    . traZODone (DESYREL) 50 MG tablet Take 0.5-1 tablets (25-50 mg total) by mouth at bedtime. 30 tablet 3   No current facility-administered medications for this visit.   Allergies  Allergen Reactions  . Amlodipine Other (See Comments)    Didn't feel well on 2.5 mg dose.   . Sulfa Antibiotics Rash    Broke out in her joints one time as a teenager     Exam:  BP 116/69 mmHg  Pulse 69  Wt 134 lb (60.782 kg) Gen: Well  NAD HEENT: EOMI,  MMM Lungs: Increased work of breathing. CTABL Heart: RRR no MRG Abd: NABS, Soft. Nondistended, Nontender Exts: Brisk capillary refill, warm and well perfused.  Scalp: Well healing scalp laceration  Oxygen saturation 86% at rest. Ambulating oxygen saturation dropped to 86% on room air. Her oxygen saturation increased to 100% with 2 L of nasal oxygen with ambulation.  No results found for this or any previous visit (from the past 24 hour(s)). No results found.   Please see individual assessment and plan sections.

## 2015-09-13 NOTE — ED Notes (Signed)
MD at bedside. 

## 2015-09-13 NOTE — H&P (Addendum)
History and Physical    Rachel Keith UJW:[id number] DOB: [birth year] DOA: 09/13/2015  Referring MD/NP/PA: Margarito Liner, M.D. PCP: Nani Gasser, MD  Outpatient Specialists: Dr. Olga Millers, cardiology Patient coming from: Home.  Chief Complaint: Shortness of breath.  HPI: Rachel Keith is a 75 y.o. female with medical history significant for atrial fibrillation, diagnosed earlier this year and started on Eliquis,AV/TV/MV moderate regurgitation, hypertension, pulmonary hypertension, thyroid disease who is coming to the hospital due to progressively worse shortness of breath for 1 month.  Per patient, she has been having dyspnea since about a month ago. She denies chest pain, palpitations, diaphoresis, nausea, PND, but complains of lower extremity edema and orthopnea.   She just states that she had a fall about 2 weeks ago, hitting the back of her head and had to go to the hospital in [place], [place], where apparently she was found to be anemic secondary to blood loss from the wound and had to receive 2 units of packed RBCs. She also states that she has noticed occasional bright red blood when wiping after a bowel movement or after urination. She is states that she has some medications changed while she was in the hospital, but does not know specifically why they were changed.   ED Course: In the ER, workup demonstrated dropping hemoglobin from 15.7 on 05/28/2015 to 10.7 g/dL today. Her urine is also positive for hemoglobin and the microscopic shows too numerous to count RBCs.  Review of Systems: As per HPI otherwise 10 point review of systems negative.  Past Medical History  Diagnosis Date  . Hypertension   . Thyroid disease     Past Surgical History  Procedure Laterality Date  . Cataract extraction  09/06/2010  . Abdominal hysterectomy    . Appendectomy    . Tonsillectomy    . Cholecystectomy    . Left ankle surgery    . Finger surgery       reports that she has never  smoked. She does not have any smokeless tobacco history on file. She reports that she drinks alcohol. She reports that she does not use illicit drugs.  Allergies  Allergen Reactions  . Amlodipine Other (See Comments)    Didn't feel well on 2.5 mg dose.   . Sulfa Antibiotics Rash    Broke out in her joints one time as a teenager    Family History  Problem Relation Age of Onset  .     Family history reviewed and updated with the patient.  Prior to Admission medications   Medication Sig Start Date End Date Taking? Authorizing Provider  diazepam (VALIUM) 10 MG tablet Take 1 tablet (10 mg total) by mouth daily as needed. 06/07/15   Agapito Games, MD  diclofenac sodium (VOLTAREN) 1 % GEL Apply 2 g topically 4 (four) times daily.    Historical Provider, MD  diltiazem (CARDIZEM SR) 90 MG 12 hr capsule Take 1 capsule (90 mg total) by mouth every 12 (twelve) hours. 07/18/15   Agapito Games, MD  ELIQUIS 5 MG TABS tablet TAKE 1 TABLET BY MOUTH TWICE A DAY 09/10/15   Agapito Games, MD  estradiol (ESTRACE) 2 MG tablet Take 1 tablet (2 mg total) by mouth daily. 07/30/15   Agapito Games, MD  levothyroxine (SYNTHROID, LEVOTHROID) 150 MCG tablet Take 1 tablet (150 mcg total) by mouth daily. 07/16/15   Agapito Games, MD  metoprolol (LOPRESSOR) 100 MG tablet Take 1 tablet (100 mg total) by mouth 2 (  two) times daily. 06/07/15   Agapito Games, MD  Multiple Vitamin (MULTIVITAMIN) tablet Take 1 tablet by mouth daily.    Historical Provider, MD  MYRBETRIQ 25 MG TB24 tablet Take 25 mg by mouth daily. 05/22/15   Historical Provider, MD  traZODone (DESYREL) 50 MG tablet Take 0.5-1 tablets (25-50 mg total) by mouth at bedtime. 04/23/15   Agapito Games, MD    Physical Exam: Filed Vitals:   09/13/15 1517 09/13/15 1550 09/13/15 1730 09/13/15 1745  BP: 131/74  122/83 116/65  Pulse: 74 64 62 71  Temp: 97.8 F (36.6 C)     TempSrc: Oral     Resp:   20 17  SpO2: 99% 99% 100%  98%      Constitutional: NAD, calm, comfortable Filed Vitals:   09/13/15 1517 09/13/15 1550 09/13/15 1730 09/13/15 1745  BP: 131/74  122/83 116/65  Pulse: 74 64 62 71  Temp: 97.8 F (36.6 C)     TempSrc: Oral     Resp:   20 17  SpO2: 99% 99% 100% 98%   Eyes: PERRL, lids and conjunctivae normal ENMT: Mucous membranes are moist. Posterior pharynx clear of any exudate or lesions.Normal dentition.  Neck: normal, supple, no masses, no thyromegaly Respiratory: clear to auscultation bilaterally, no wheezing, no crackles. Normal respiratory effort. No accessory muscle use.  Cardiovascular: Irregularly irregular, positive 2/6 systolic murmur /no rubs / no gallops. 2+ lower extremity pitting edema. 2+ pedal pulses. No carotid bruits.  Abdomen: no tenderness, no masses palpated. No hepatosplenomegaly. Bowel sounds positive.  Musculoskeletal: no clubbing / cyanosis. No joint deformity upper and lower extremities. Good ROM, no contractures. Normal muscle tone.  Skin: no rashes, lesions, ulcers. No induration Neurologic: CN 2-12 grossly intact. Sensation intact, DTR normal. Strength 5/5 in all 4.  Psychiatric: Normal judgment and insight. Alert and oriented x 3. Normal mood.     Labs on Admission: I have personally reviewed following labs and imaging studies  CBC:  Recent Labs Lab 09/13/15 1621  WBC 6.7  NEUTROABS 4.0  HGB 10.7*  HCT 33.6*  MCV 95.7  PLT 120*   Basic Metabolic Panel:  Recent Labs Lab 09/13/15 1621  NA 145  K 4.3  CL 112*  CO2 21*  GLUCOSE 100*  BUN 17  CREATININE 1.16*  CALCIUM 9.0   GFR: Estimated Creatinine Clearance: 34.1 mL/min (by C-G formula based on Cr of 1.16).   Cardiac Enzymes:  Recent Labs Lab 09/13/15 1621  TROPONINI <0.03  Urine analysis:    Component Value Date/Time   COLORURINE AMBER* 09/13/2015 1741   APPEARANCEUR CLOUDY* 09/13/2015 1741   LABSPEC 1.012 09/13/2015 1741   PHURINE 5.5 09/13/2015 1741   GLUCOSEU NEGATIVE  09/13/2015 1741   HGBUR LARGE* 09/13/2015 1741   BILIRUBINUR NEGATIVE 09/13/2015 1741   BILIRUBINUR SMALL 09/03/2015 1104   KETONESUR NEGATIVE 09/13/2015 1741   PROTEINUR 30* 09/13/2015 1741   PROTEINUR 100 09/03/2015 1104   UROBILINOGEN 0.2 09/03/2015 1104   UROBILINOGEN 0.2 04/19/2012 0000   NITRITE NEGATIVE 09/13/2015 1741   NITRITE NEGATIVE 09/03/2015 1104   LEUKOCYTESUR TRACE* 09/13/2015 1741     Radiological Exams on Admission: Dg Chest 2 View  09/13/2015  CLINICAL DATA:  Cough and shortness of breath for the past 3-4 months; history of hypertension. EXAM: CHEST  2 VIEW COMPARISON:  Portable chest x-ray of May 29, 2015 FINDINGS: The lungs are adequately inflated. There is a small to moderate size right pleural effusion and trace left pleural effusion.  There is bibasilar atelectasis or infiltrate. The heart is top-normal to mildly enlarged. The pulmonary vascularity is less prominent than on the previous study. The observed bony thorax exhibits no acute abnormality. IMPRESSION: Moderate sized right pleural effusion and small left pleural effusion. Both are increased since the previous study. New bibasilar atelectasis or pneumonia. Cardiomegaly without pulmonary vascular congestion. Electronically Signed   By: Darryle Dennie  [place] M.D.   On: 09/13/2015 12:12  Echocardiogram 05/29/2015  ------------------------------------------------------------------- LV EF: 50% - 55%  ------------------------------------------------------------------- Indications: CHF - 428.0.  ------------------------------------------------------------------- History: Risk factors: Hypertension.  ------------------------------------------------------------------- Study Conclusions  - Left ventricle: The cavity size was normal. Wall thickness was  normal. Systolic function was normal. The estimated ejection  fraction was in the range of 50% to 55%. - Aortic valve: There was moderate  regurgitation. - Mitral valve: There was moderate regurgitation. - Left atrium: The atrium was moderately dilated. - Right atrium: The atrium was mildly dilated. - Atrial septum: No defect or patent foramen ovale was identified. - Tricuspid valve: There was moderate regurgitation. - Pulmonary arteries: PA peak pressure: 52 mm Hg (S). - Pericardium, extracardiac: A trivial pericardial effusion was  identified posterior to the heart. There was a left pleural  effusion.   EKG: Independently reviewed. Vent. rate 74 BPM PR interval * ms QRS duration 96 ms QT/QTc 432/479 ms P-R-T axes -1 95 25 Atrial fibrillation Right axis deviation Probable anteroseptal infarct, old  Assessment/Plan Principal Problem:   Acute exacerbation of CHF (congestive heart failure) (HCC) This is being worsened by recent blood loss. Admit to telemetry/observation Continue cardiac monitoring. Continue supplemental oxygen. Continue IV furosemide. Start potassium replacement. Check magnesium level. Monitor renal function and electrolytes.    Active Problems:   Anemia Not sure, if the patient is having significant blood loss due to hemorrhoids or post GU procedure.  Will hold anticoagulation for now. Follow-up H&H. May need GI or GU evaluation if hemoglobin level continues to decrease.     Pleural effusion As above. Follow-up chest radiograph.    Essential hypertension, benign Continue atenolol. Monitor blood pressure periodically.    Hyperlipidemia Continue last modifications.    Anxiety Continue diazepam as needed.    Hypothyroidism Continue levothyroxine supplementation. Monitor TSH periodically.    Chronic kidney disease Unknown at this time why the patient was not started on      Chronic atrial fibrillation (HCC) Continue metoprolol 100 mg by mouth twice a day. Paul cardiac exam for now, since the patient is going to be started on IV furosemide and her blood pressure is low  normal.    DVT prophylaxis: On Apixaban at home. Code Status: Full. Family Communication:  Disposition Plan: Admit for diuresis and further evaluation. Consults called:  Admission status: Observation/telemetry   Bobette Moavid Manuel Rylin Seavey MD [facility] Pager [phone].   If 7PM-7AM, please contact night-coverage [email or web address] Password [email or web address]  09/13/2015, 6:51 PM

## 2015-09-13 NOTE — Patient Instructions (Signed)
Thank you for coming in today. Get xray.  Return Monday.  Bring medicine by.  Expect to hear from home oxygen people.  Call or go to the emergency room if you get worse, have trouble breathing, have chest pains, or palpitations.

## 2015-09-13 NOTE — ED Notes (Signed)
Patient ambulatory to restroom with steady gait.

## 2015-09-13 NOTE — Assessment & Plan Note (Signed)
Likely related to her heart failure symptoms. Plan for chest x-ray  and recheck next week. Additionally refer to Dr. Jens Somrenshaw. Plan for home oxygen

## 2015-09-13 NOTE — Progress Notes (Signed)
Quick Note:  CXR shows fluid pooling outside of the lungs at the bases. I think we need to get you to the hospital to get this taken care of. If pt want to have a discussion about it please return this afternoon. ______

## 2015-09-13 NOTE — ED Notes (Signed)
Patient's daughter, Byrd HesselbachMaria, would like to be updated when patient has bed or is discharged. [phone]

## 2015-09-13 NOTE — ED Provider Notes (Signed)
CSN: [id number]     Arrival date & time 09/13/15  1510 History   First MD Initiated Contact with Patient 09/13/15 1606     Chief Complaint  Patient presents with  . Shortness of Breath     (Consider location/radiation/quality/duration/timing/severity/associated sxs/prior Treatment) HPI  75 year old female presents with dyspnea for over 1 month (since January?). Seen by PCP and sent for Xray due to hypoxia when ambulating (down to 86%). CXR shows effusions, told to go to ER immediately. Gets lightheaded when lying flat for 1 week. Dyspnea with talking or walking. OK at rest. No chest pain/pressure. Minimal cough, no fevers. Bilateral lower extremity edema. She's unsure for how long, has noticed over past few days. Diagnosed with CHF in January, took herself off lasix because she didn't like the urinating more often. Also complaining of chronic hematuria.  Past Medical History  Diagnosis Date  . Hypertension   . Thyroid disease    Past Surgical History  Procedure Laterality Date  . Cataract extraction  09/06/2010  . Abdominal hysterectomy    . Appendectomy    . Tonsillectomy    . Cholecystectomy    . Left ankle surgery    . Finger surgery     Family History  Problem Relation Age of Onset  .      Social History  Substance Use Topics  . Smoking status: Never Smoker   . Smokeless tobacco: None  . Alcohol Use: 0.0 oz/week    0 Standard drinks or equivalent per week     Comment: occasional   OB History    No data available     Review of Systems  Constitutional: Negative for fever.  Respiratory: Positive for cough and shortness of breath.   Cardiovascular: Positive for leg swelling. Negative for chest pain.  Genitourinary: Positive for hematuria.  All other systems reviewed and are negative.     Allergies  Amlodipine and Sulfa antibiotics  Home Medications   Prior to Admission medications   Medication Sig Start Date End Date Taking? Authorizing Provider  diazepam  (VALIUM) 10 MG tablet Take 1 tablet (10 mg total) by mouth daily as needed. 06/07/15   Agapito Games, MD  diclofenac sodium (VOLTAREN) 1 % GEL Apply 2 g topically 4 (four) times daily.    Historical Provider, MD  diltiazem (CARDIZEM SR) 90 MG 12 hr capsule Take 1 capsule (90 mg total) by mouth every 12 (twelve) hours. 07/18/15   Agapito Games, MD  ELIQUIS 5 MG TABS tablet TAKE 1 TABLET BY MOUTH TWICE A DAY 09/10/15   Agapito Games, MD  estradiol (ESTRACE) 2 MG tablet Take 1 tablet (2 mg total) by mouth daily. 07/30/15   Agapito Games, MD  levothyroxine (SYNTHROID, LEVOTHROID) 150 MCG tablet Take 1 tablet (150 mcg total) by mouth daily. 07/16/15   Agapito Games, MD  metoprolol (LOPRESSOR) 100 MG tablet Take 1 tablet (100 mg total) by mouth 2 (two) times daily. 06/07/15   Agapito Games, MD  Multiple Vitamin (MULTIVITAMIN) tablet Take 1 tablet by mouth daily.    Historical Provider, MD  MYRBETRIQ 25 MG TB24 tablet Take 25 mg by mouth daily. 05/22/15   Historical Provider, MD  traZODone (DESYREL) 50 MG tablet Take 0.5-1 tablets (25-50 mg total) by mouth at bedtime. 04/23/15   Agapito Games, MD   BP 131/74 mmHg  Pulse 64  Temp(Src) 97.8 F (36.6 C) (Oral)  SpO2 99% Physical Exam  Constitutional: She is  oriented to person, place, and time. She appears well-developed and well-nourished.  HENT:  Head: Normocephalic and atraumatic.  Right Ear: External ear normal.  Left Ear: External ear normal.  Nose: Nose normal.  Eyes: Right eye exhibits no discharge. Left eye exhibits no discharge.  Cardiovascular: Normal rate and normal heart sounds.  An irregularly irregular rhythm present.  Pulmonary/Chest: Effort normal. No respiratory distress. She has decreased breath sounds in the right lower field and the left lower field.  Abdominal: Soft. There is no tenderness.  Musculoskeletal: She exhibits edema (bilateral pitting edema for feet, ankles and distal lower legs).   Neurological: She is alert and oriented to person, place, and time.  Skin: Skin is warm and dry.  Nursing note and vitals reviewed.   ED Course  Procedures (including critical care time) Labs Review Labs Reviewed  BASIC METABOLIC PANEL - Abnormal; Notable for the following:    Chloride 112 (*)    CO2 21 (*)    Glucose, Bld 100 (*)    Creatinine, Ser 1.16 (*)    GFR calc non Af Amer 45 (*)    GFR calc Af Amer 52 (*)    All other components within normal limits  CBC WITH DIFFERENTIAL/PLATELET - Abnormal; Notable for the following:    RBC 3.51 (*)    Hemoglobin 10.7 (*)    HCT 33.6 (*)    Platelets 120 (*)    All other components within normal limits  BRAIN NATRIURETIC PEPTIDE - Abnormal; Notable for the following:    B Natriuretic Peptide 222.8 (*)    All other components within normal limits  URINALYSIS, ROUTINE W REFLEX MICROSCOPIC (NOT AT [facility]) - Abnormal; Notable for the following:    Color, Urine AMBER (*)    APPearance CLOUDY (*)    Hgb urine dipstick LARGE (*)    Protein, ur 30 (*)    Leukocytes, UA TRACE (*)    All other components within normal limits  URINE MICROSCOPIC-ADD ON - Abnormal; Notable for the following:    Squamous Epithelial / LPF 0-5 (*)    Bacteria, UA FEW (*)    All other components within normal limits  TROPONIN I  POC OCCULT BLOOD, ED    Imaging Review Dg Chest 2 View  09/13/2015  CLINICAL DATA:  Cough and shortness of breath for the past 3-4 months; history of hypertension. EXAM: CHEST  2 VIEW COMPARISON:  Portable chest x-ray of May 29, 2015 FINDINGS: The lungs are adequately inflated. There is a small to moderate size right pleural effusion and trace left pleural effusion. There is bibasilar atelectasis or infiltrate. The heart is top-normal to mildly enlarged. The pulmonary vascularity is less prominent than on the previous study. The observed bony thorax exhibits no acute abnormality. IMPRESSION: Moderate sized right pleural effusion and  small left pleural effusion. Both are increased since the previous study. New bibasilar atelectasis or pneumonia. Cardiomegaly without pulmonary vascular congestion. Electronically Signed   By: David  [place] M.D.   On: 09/13/2015 12:12   I have personally reviewed and evaluated these images and lab results as part of my medical decision-making.   EKG Interpretation   Date/Time:  Thursday Sep 13 2015 17:15:04 EDT Ventricular Rate:  74 PR Interval:    QRS Duration: 96 QT Interval:  432 QTC Calculation: 479 R Axis:   95 Text Interpretation:  Atrial fibrillation Right axis deviation Probable  anteroseptal infarct, old Confirmed by Ryken Paschal MD, Michalle Rademaker [phone] on  09/13/2015 5:58:23 PM  MDM   Final diagnoses:  Acute on chronic congestive heart failure, unspecified congestive heart failure type (HCC)  Pleural effusion    Patient is having progressive shortness of breath. She is not in distress here but apparently is hypoxemic whenever walking. Due to this she will be admitted for observation and diuresis given she clinically appears to be fluid overloaded. Might need a pulmonology consult with possible thoracentesis if not better. Currently stable for a floor admission.    Pricilla Loveless, MD 09/13/15 [phone]

## 2015-09-14 ENCOUNTER — Observation Stay (HOSPITAL_COMMUNITY): Payer: Commercial Managed Care - HMO

## 2015-09-14 DIAGNOSIS — I482 Chronic atrial fibrillation: Secondary | ICD-10-CM | POA: Diagnosis not present

## 2015-09-14 DIAGNOSIS — D500 Iron deficiency anemia secondary to blood loss (chronic): Secondary | ICD-10-CM

## 2015-09-14 DIAGNOSIS — I5033 Acute on chronic diastolic (congestive) heart failure: Secondary | ICD-10-CM

## 2015-09-14 DIAGNOSIS — N183 Chronic kidney disease, stage 3 (moderate): Secondary | ICD-10-CM

## 2015-09-14 DIAGNOSIS — D62 Acute posthemorrhagic anemia: Secondary | ICD-10-CM | POA: Diagnosis not present

## 2015-09-14 DIAGNOSIS — I5043 Acute on chronic combined systolic (congestive) and diastolic (congestive) heart failure: Secondary | ICD-10-CM | POA: Diagnosis not present

## 2015-09-14 DIAGNOSIS — R319 Hematuria, unspecified: Secondary | ICD-10-CM | POA: Diagnosis not present

## 2015-09-14 LAB — COMPREHENSIVE METABOLIC PANEL
ALK PHOS: 64 U/L (ref 38–126)
ALT: 16 U/L (ref 14–54)
AST: 18 U/L (ref 15–41)
Albumin: 2.8 g/dL — ABNORMAL LOW (ref 3.5–5.0)
Anion gap: 14 (ref 5–15)
BILIRUBIN TOTAL: 0.8 mg/dL (ref 0.3–1.2)
BUN: 12 mg/dL (ref 6–20)
CALCIUM: 8.9 mg/dL (ref 8.9–10.3)
CO2: 24 mmol/L (ref 22–32)
CREATININE: 1.1 mg/dL — AB (ref 0.44–1.00)
Chloride: 108 mmol/L (ref 101–111)
GFR, EST AFRICAN AMERICAN: 55 mL/min — AB (ref >60)
GFR, EST NON AFRICAN AMERICAN: 48 mL/min — AB (ref >60)
Glucose, Bld: 81 mg/dL (ref 65–99)
Potassium: 3.4 mmol/L — ABNORMAL LOW (ref 3.5–5.1)
Sodium: 146 mmol/L — ABNORMAL HIGH (ref 135–145)
Total Protein: 5.2 g/dL — ABNORMAL LOW (ref 6.5–8.1)

## 2015-09-14 LAB — CBC
HCT: 30.9 % — ABNORMAL LOW (ref 36.0–46.0)
Hemoglobin: 10 g/dL — ABNORMAL LOW (ref 12.0–15.0)
MCH: 30.6 pg (ref 26.0–34.0)
MCHC: 32.4 g/dL (ref 30.0–36.0)
MCV: 94.5 fL (ref 78.0–100.0)
PLATELETS: 117 10*3/uL — AB (ref 150–400)
RBC: 3.27 MIL/uL — AB (ref 3.87–5.11)
RDW: 14.4 % (ref 11.5–15.5)
WBC: 5.1 10*3/uL (ref 4.0–10.5)

## 2015-09-14 LAB — TROPONIN I

## 2015-09-14 MED ORDER — POTASSIUM CHLORIDE CRYS ER 20 MEQ PO TBCR
20.0000 meq | EXTENDED_RELEASE_TABLET | Freq: Three times a day (TID) | ORAL | Status: DC
  Administered 2015-09-14 – 2015-09-16 (×6): 20 meq via ORAL
  Filled 2015-09-14 (×7): qty 1

## 2015-09-14 NOTE — Progress Notes (Signed)
Spiritual Care consult was completed by the Chaplain. It was noted on consult that the patient had requested prayer, and specified it be completed by a Zorita PangPriest.  Upon presenting to the room the patient was asked if she was connected to a local [place], and she reports that she was, but stated she no that she no longer a visit.  She states she didn't feel like talking or answering any questions at this time. Chaplain respected that request and ended the visit, follow up will on a as needed basis. Chaplain Janell QuietAudrey Mariza Bourget [phone]

## 2015-09-14 NOTE — Progress Notes (Signed)
Pt refused to wear fall risk arm band, pt had a fall 2 weeks ago.

## 2015-09-14 NOTE — Progress Notes (Signed)
Pt refused bed alarm on, educated on pt's safety plan but pt still refused bed alarm activated.

## 2015-09-14 NOTE — Care Management Obs Status (Signed)
MEDICARE OBSERVATION STATUS NOTIFICATION   Patient Details  Name: Rachel Keith MRN: [id number] Date of Birth: 10-04-1940   Medicare Observation Status Notification Given:  Yes  Patient refused to sign form at this time. BLC   Cherrie Distancehandler, Keren Alverio L, RN 09/14/2015, 3:25 PM

## 2015-09-14 NOTE — Consult Note (Signed)
   [facility] CM [facility] Consult   09/14/2015  Evette Doffinglaine Kamen 1941/02/24 [id number] Patient screened for potential [facility] Care Management services. Patient is eligible for [facility] under [facility]'s [facility]  plan. Patient was asleep and left a brochure at bedside.   Please place a [facility] Care Management consult or for questions contact:   Charlesetta ShanksVictoria Nora Sabey, RN BSN CCM [facility] Lia[facility]  [phone] business mobile phone Toll free office [phone]

## 2015-09-14 NOTE — Progress Notes (Signed)
Patient refused 6pm dose of lasix.  She stated that earlier it made her get up so frequently to use the bathroom.  She was educated that this was how lasix works and it helps get the fluid off.  She stated that she did not want to be getting up all night.

## 2015-09-14 NOTE — Progress Notes (Signed)
Nutrition Brief Note  Patient identified on the Malnutrition Screening Tool (MST) Report  Wt Readings from Last 15 Encounters:  09/14/15 127 lb (57.607 kg)  09/13/15 134 lb (60.782 kg)  09/07/15 130 lb (58.968 kg)  09/05/15 129 lb (58.514 kg)  09/03/15 131 lb (59.421 kg)  08/31/15 127 lb (57.607 kg)  08/28/15 126 lb (57.153 kg)  07/26/15 125 lb (56.7 kg)  06/07/15 133 lb (60.328 kg)  05/30/15 126 lb 12.2 oz (57.5 kg)  05/28/15 129 lb (58.514 kg)  04/23/15 130 lb (58.968 kg)  01/01/15 125 lb (56.7 kg)  12/27/14 125 lb (56.7 kg)  10/18/14 133 lb (60.328 kg)    Body mass index is 24.8 kg/(m^2). Patient meets criteria for Normal Weight based on current BMI.   Current diet order is Heart Healthy, patient is consuming approximately 100% of meals at this time. Labs and medications reviewed. Pt reports having a decreased appetite, but feels she is eating well and she declines any nutrition interventions at this time.   No nutrition interventions warranted at this time. If nutrition issues arise, please consult RD.   Dorothea Ogleeanne Valor [place] RD, LDN Inpatient Clinical Dietitian Pager: [phone] After Hours Pager: [phone]

## 2015-09-14 NOTE — Progress Notes (Signed)
Refuses to wear yellow socks, yellow bracelet, or set bed alarm. Pt reminded of the risk of falling.

## 2015-09-14 NOTE — Care Management Obs Status (Signed)
MEDICARE OBSERVATION STATUS NOTIFICATION   Patient Details  Name: Rachel Keith MRN: [id number] Date of Birth: [birth year]   Medicare Observation Status Notification Given:  Yes   Nancy FetterSamantha Claxton,RN, BSN 09/14/2015, 4:31 PM

## 2015-09-14 NOTE — Progress Notes (Signed)
PROGRESS NOTE    Rachel Keith  VHQ:[id number] DOB: Nov 24, 1940 DOA: 09/13/2015 PCP: Nani Gasser, MD   Outpatient Specialists:    Brief Narrative:  75 y.o. female with medical history significant for atrial fibrillation, diagnosed earlier this year and started on Eliquis,AV/TV/MV moderate regurgitation, hypertension, pulmonary hypertension, thyroid disease, recurrent hematuria with history of Nephrolithiasis s/p urological procedures in the past to facilitate passing of kidney stones, presents with progressively worsening of shortness of breath for 1 month. Patient denies chest pain, palpitations, diaphoresis, nausea, PND, but complains of lower extremity edema and orthopnea. History of recent fall (about 2 weeks ago), hitting the back of her head and had to go to the hospital in [place], [place], where apparently she was found to be anemia,. As mentioned above patient has history of hematuria.   Assessment & Plan:   Principal Problem:   Acute exacerbation of CHF (congestive heart failure) (HCC) Active Problems:   Essential hypertension, benign   Hyperlipidemia   Anxiety   Hypothyroidism   Chronic kidney disease   Pleural effusion   Chronic atrial fibrillation (HCC)   Anemia  Principal Problem:  Acute exacerbation of CHF (congestive heart failure) (HCC) This is being worsened by recent blood loss. Admit to telemetry/observation Continue cardiac monitoring. Continue supplemental oxygen. Continue IV furosemide. Start potassium replacement. Check magnesium level. Monitor renal function and electrolytes.   Active Problems: -Anemia, likely blood loss anemia. History of Hematuria noted. Patient is known to a Insurance underwriter. Patient will need to follow up with Urologist on discharge. Transfuse PRBC PRN. Renal ultrasound. Patient has always refused colonoscopy in the past. Follow-up H&H. -Pleural effusion - Likely related to chronic afib and CHF - Essential hypertension, benign -  Optimize. Continue atenolol. - Hyperlipidemia. - Anxiety. - Hypothyroidism -Chronic kidney disease - Chronic atrial fibrillation (HCC):  Continue metoprolol 100 mg by mouth twice a day.   DVT prophylaxis: On Apixaban at home. Code Status: Full. Family Communication:  Disposition Plan: Admit for diuresis and further evaluation. Consults called:  Admission status: Observation/telemetry  Subjective: Nil new complaints. Reports chronic SOB and leg edema.  Objective: Filed Vitals:   09/13/15 2120 09/14/15 0426 09/14/15 1012 09/14/15 1236  BP: 107/61 122/80 130/91 132/74  Pulse: 82 96 108 91  Temp: 97.6 F (36.4 C) 98.2 F (36.8 C)  98.2 F (36.8 C)  TempSrc: Oral Oral  Oral  Resp: Height: 5' (1.524 m)     Weight: 58.877 kg (129 lb 12.8 oz) 57.607 kg (127 lb)    SpO2: 97% 95%  93%    Intake/Output Summary (Last 24 hours) at 09/14/15 1739 Last data filed at 09/14/15 1726  Gross per 24 hour  Intake    945 ml  Output   2900 ml  Net  -1955 ml   Filed Weights   09/13/15 2120 09/14/15 0426  Weight: 58.877 kg (129 lb 12.8 oz) 57.607 kg (127 lb)    Examination:  General exam: Appears calm and comfortable. Pallor. Respiratory system: Decreased air entry.. Cardiovascular system: S1 & S2, irregular. Systolic murmur.  Gastrointestinal system: Abdomen is nondistended, soft and nontender. No organomegaly or masses felt. Normal bowel sounds heard. Central nervous system: Alert and oriented. No focal neurological deficits. Extremities: Leg edema.  Data Reviewed: I have personally reviewed following labs and imaging studies  CBC:  Recent Labs Lab 09/13/15 1621 09/13/15 2205 09/14/15 0431  WBC 6.7  --  5.1  NEUTROABS 4.0  --   --   HGB 10.7*  10.1* 10.0*  HCT 33.6* 31.7* 30.9*  MCV 95.7  --  94.5  PLT 120*  --  117*   Basic Metabolic Panel:  Recent Labs Lab 09/13/15 1621 09/14/15 0431  NA 145 146*  K 4.3 3.4*  CL 112* 108  CO2 21* 24  GLUCOSE 100*  81  BUN 17 12  CREATININE 1.16* 1.10*  CALCIUM 9.0 8.9   GFR: Estimated Creatinine Clearance: 35.1 mL/min (by C-G formula based on Cr of 1.1). Liver Function Tests:  Recent Labs Lab 09/14/15 0431  AST 18  ALT 16  ALKPHOS 64  BILITOT 0.8  PROT 5.2*  ALBUMIN 2.8*   No results for input(s): LIPASE, AMYLASE in the last 168 hours. No results for input(s): AMMONIA in the last 168 hours. Coagulation Profile: No results for input(s): INR, PROTIME in the last 168 hours. Cardiac Enzymes:  Recent Labs Lab 09/13/15 1621 09/13/15 2205 09/14/15 0431  TROPONINI <0.03 <0.03 <0.03   BNP (last 3 results) No results for input(s): PROBNP in the last 8760 hours. HbA1C: No results for input(s): HGBA1C in the last 72 hours. CBG: No results for input(s): GLUCAP in the last 168 hours. Lipid Profile: No results for input(s): CHOL, HDL, LDLCALC, TRIG, CHOLHDL, LDLDIRECT in the last 72 hours. Thyroid Function Tests: No results for input(s): TSH, T4TOTAL, FREET4, T3FREE, THYROIDAB in the last 72 hours. Anemia Panel: No results for input(s): VITAMINB12, FOLATE, FERRITIN, TIBC, IRON, RETICCTPCT in the last 72 hours. Urine analysis:    Component Value Date/Time   COLORURINE AMBER* 09/13/2015 1741   APPEARANCEUR CLOUDY* 09/13/2015 1741   LABSPEC 1.012 09/13/2015 1741   PHURINE 5.5 09/13/2015 1741   GLUCOSEU NEGATIVE 09/13/2015 1741   HGBUR LARGE* 09/13/2015 1741   BILIRUBINUR NEGATIVE 09/13/2015 1741   BILIRUBINUR SMALL 09/03/2015 1104   KETONESUR NEGATIVE 09/13/2015 1741   PROTEINUR 30* 09/13/2015 1741   PROTEINUR 100 09/03/2015 1104   UROBILINOGEN 0.2 09/03/2015 1104   UROBILINOGEN 0.2 04/19/2012 0000   NITRITE NEGATIVE 09/13/2015 1741   NITRITE NEGATIVE 09/03/2015 1104   LEUKOCYTESUR TRACE* 09/13/2015 1741   Sepsis Labs: @LABRCNTIP (procalcitonin:4,lacticidven:4)  )No results found for this or any previous visit (from the past 240 hour(s)).   Radiology Studies: Dg Chest 2  View  09/13/2015  CLINICAL DATA:  Cough and shortness of breath for the past 3-4 months; history of hypertension. EXAM: CHEST  2 VIEW COMPARISON:  Portable chest x-ray of May 29, 2015 FINDINGS: The lungs are adequately inflated. There is a small to moderate size right pleural effusion and trace left pleural effusion. There is bibasilar atelectasis or infiltrate. The heart is top-normal to mildly enlarged. The pulmonary vascularity is less prominent than on the previous study. The observed bony thorax exhibits no acute abnormality. IMPRESSION: Moderate sized right pleural effusion and small left pleural effusion. Both are increased since the previous study. New bibasilar atelectasis or pneumonia. Cardiomegaly without pulmonary vascular congestion. Electronically Signed   By: David  SwazilandJordan M.D.   On: 09/13/2015 12:12   [place] Renal  09/14/2015  CLINICAL DATA:  75 year old female inpatient with gross hematuria. Chronic kidney disease. EXAM: RENAL / URINARY TRACT ULTRASOUND COMPLETE COMPARISON:  None. FINDINGS: Right Kidney: Length: 10.4 cm. Normal right renal parenchymal thickness and echogenicity. No right hydronephrosis. No right renal mass demonstrated. 8 x 5 x 4 mm shadowing nonobstructing stone in the right lower kidney. Incidentally noted right pleural effusion. Left Kidney: Length: 10.3 cm. Echogenicity within normal limits. No mass or hydronephrosis visualized. No left renal stones  large enough to cause acoustic shadowing. Incidentally noted left pleural effusion. Bladder: Appears normal for degree of bladder distention. IMPRESSION: 1. Nonobstructing right renal stone. Otherwise normal kidneys, with no hydronephrosis. 2. Normal bladder. 3. Incidental bilateral pleural effusions. Electronically Signed   By: Delbert Phenix M.D.   On: 09/14/2015 12:02   Scheduled Meds: . atenolol  50 mg Oral Daily  . diclofenac sodium  2 g Topical QID  . estradiol  2 mg Oral Daily  . feeding supplement (ENSURE ENLIVE)  237  mL Oral BID BM  . furosemide  40 mg Intravenous BID  . levothyroxine  150 mcg Oral QAC breakfast  . mirabegron ER  25 mg Oral Daily  . multivitamin with minerals  1 tablet Oral Daily  . potassium chloride  20 mEq Oral TID  . sodium chloride flush  3 mL Intravenous Q12H  . traZODone  50 mg Oral QHS   Continuous Infusions:   Time spent: 30 Mins. Updated patient's daughter.    Barnetta Chapel, MD [facility] Pager [phone]  If 7PM-7AM, please contact night-coverage [email or web address] Password [facility] 09/14/2015, 5:39 PM

## 2015-09-15 ENCOUNTER — Observation Stay (HOSPITAL_BASED_OUTPATIENT_CLINIC_OR_DEPARTMENT_OTHER): Payer: Commercial Managed Care - HMO

## 2015-09-15 DIAGNOSIS — D500 Iron deficiency anemia secondary to blood loss (chronic): Secondary | ICD-10-CM | POA: Diagnosis not present

## 2015-09-15 DIAGNOSIS — I482 Chronic atrial fibrillation: Secondary | ICD-10-CM | POA: Diagnosis not present

## 2015-09-15 DIAGNOSIS — I5033 Acute on chronic diastolic (congestive) heart failure: Secondary | ICD-10-CM | POA: Diagnosis not present

## 2015-09-15 DIAGNOSIS — I361 Nonrheumatic tricuspid (valve) insufficiency: Secondary | ICD-10-CM

## 2015-09-15 LAB — BASIC METABOLIC PANEL
Anion gap: 12 (ref 5–15)
BUN: 14 mg/dL (ref 6–20)
CALCIUM: 9.2 mg/dL (ref 8.9–10.3)
CHLORIDE: 105 mmol/L (ref 101–111)
CO2: 27 mmol/L (ref 22–32)
CREATININE: 1.1 mg/dL — AB (ref 0.44–1.00)
GFR calc Af Amer: 55 mL/min — ABNORMAL LOW (ref >60)
GFR calc non Af Amer: 48 mL/min — ABNORMAL LOW (ref >60)
GLUCOSE: 94 mg/dL (ref 65–99)
Potassium: 3.9 mmol/L (ref 3.5–5.1)
Sodium: 144 mmol/L (ref 135–145)

## 2015-09-15 LAB — CBC WITH DIFFERENTIAL/PLATELET
Basophils Absolute: 0 10*3/uL (ref 0.0–0.1)
Basophils Relative: 1 %
Eosinophils Absolute: 0.2 10*3/uL (ref 0.0–0.7)
Eosinophils Relative: 3 %
HCT: 33.8 % — ABNORMAL LOW (ref 36.0–46.0)
Hemoglobin: 11 g/dL — ABNORMAL LOW (ref 12.0–15.0)
Lymphocytes Relative: 37 %
Lymphs Abs: 2.1 10*3/uL (ref 0.7–4.0)
MCH: 30.7 pg (ref 26.0–34.0)
MCHC: 32.5 g/dL (ref 30.0–36.0)
MCV: 94.4 fL (ref 78.0–100.0)
Monocytes Absolute: 0.9 10*3/uL (ref 0.1–1.0)
Monocytes Relative: 15 %
Neutro Abs: 2.7 10*3/uL (ref 1.7–7.7)
Neutrophils Relative %: 44 %
Platelets: 129 10*3/uL — ABNORMAL LOW (ref 150–400)
RBC: 3.58 MIL/uL — ABNORMAL LOW (ref 3.87–5.11)
RDW: 14.4 % (ref 11.5–15.5)
WBC: 5.9 10*3/uL (ref 4.0–10.5)

## 2015-09-15 LAB — RENAL FUNCTION PANEL
Albumin: 2.7 g/dL — ABNORMAL LOW (ref 3.5–5.0)
Anion gap: 11 (ref 5–15)
BUN: 14 mg/dL (ref 6–20)
CO2: 27 mmol/L (ref 22–32)
Calcium: 9.2 mg/dL (ref 8.9–10.3)
Chloride: 105 mmol/L (ref 101–111)
Creatinine, Ser: 1.03 mg/dL — ABNORMAL HIGH (ref 0.44–1.00)
GFR calc Af Amer: >60 mL/min (ref >60)
GFR calc non Af Amer: 52 mL/min — ABNORMAL LOW (ref >60)
Glucose, Bld: 92 mg/dL (ref 65–99)
Phosphorus: 3.1 mg/dL (ref 2.5–4.6)
Potassium: 3.9 mmol/L (ref 3.5–5.1)
Sodium: 143 mmol/L (ref 135–145)

## 2015-09-15 LAB — ECHOCARDIOGRAM COMPLETE
Height: 60 in
Weight: 1971.2 oz

## 2015-09-15 MED ORDER — FUROSEMIDE 10 MG/ML IJ SOLN
40.0000 mg | Freq: Two times a day (BID) | INTRAMUSCULAR | Status: DC
  Administered 2015-09-15 – 2015-09-16 (×2): 40 mg via INTRAVENOUS
  Filled 2015-09-15 (×3): qty 4

## 2015-09-15 NOTE — Progress Notes (Signed)
  Echocardiogram 2D Echocardiogram has been performed.  Rachel Keith, Rachel Keith 09/15/2015, 12:57 PM

## 2015-09-15 NOTE — Progress Notes (Addendum)
Patient refusing to take pm dose of Lasix, despite education and encouragement to do so.  States she is too embarrassed to be "peeing all over myself" and states that she is tired of doctors contradicting each other.  Patient states " I would rather die than go through this again."  Patient's responses do not seem rational and she does appear to be disoriented at times.  ? If she is going to adhere to her medication regimen as her daughter also has questions re:   This.

## 2015-09-15 NOTE — Progress Notes (Signed)
PROGRESS NOTE    Rachel Keith  [name] DOB: 04-Feb-1941 DOA: 09/13/2015 PCP: Nani Gasser, MD   Outpatient Specialists:    Brief Narrative:  75 y.o. female with medical history significant for atrial fibrillation, diagnosed earlier this year and started on Eliquis,AV/TV/MV moderate regurgitation, hypertension, pulmonary hypertension, thyroid disease, recurrent hematuria with history of Nephrolithiasis s/p urological procedures in the past to facilitate passing of kidney stones, presents with progressively worsening of shortness of breath for 1 month. Patient has been non compliant with the diuretics. Patient denies chest pain, palpitations, diaphoresis, nausea, PND, but complains of lower extremity edema and orthopnea. Patient's daughter is worried that the patient may be dementing, therefore, patient's history can not be fully trusted. History of recent fall (about 2 weeks ago), hitting the back of her head and had to go to the hospital in [place], [place], where apparently she was found to be anemia,. As mentioned above patient has history of hematuria. CXR also reveals pleural effusion.  Assessment & Plan:   Principal Problem:   Acute exacerbation of CHF (congestive heart failure) (HCC) Active Problems:   Essential hypertension, benign   Hyperlipidemia   Anxiety   Hypothyroidism   Chronic kidney disease   Pleural effusion   Chronic atrial fibrillation (HCC)   Anemia  Principal Problem:  Acute exacerbation of CHF (congestive heart failure) (HCC) This is being worsened by recent blood loss. Admit to telemetry/observation Continue cardiac monitoring. Continue supplemental oxygen. Continue IV furosemide. Start potassium replacement. Check magnesium level. Monitor renal function and electrolytes.   Active Problems: -Anemia, likely blood loss anemia. History of Hematuria noted. Patient is known to a Insurance underwriter. Patient will need to follow up with Urologist on discharge.  Transfuse PRBC PRN. Renal ultrasound. Patient has always refused colonoscopy in the past. Follow-up H&H. -Pleural effusion - Likely related to chronic afib and CHF. Continue diuresis. - Essential hypertension, benign - Optimize. Continue atenolol. - Hyperlipidemia. - Anxiety. - Hypothyroidism -Chronic kidney disease - Chronic atrial fibrillation (HCC):  Continue metoprolol 100 mg by mouth twice a day. - Memory problems, probably dementia - Continue to assess. Will need full assessment when patient is back to her baseline.   DVT prophylaxis: On Apixaban at home. Code Status: Full. Family Communication:  Disposition Plan: Admit for diuresis and further evaluation. Consults called:  Admission status: Observation/telemetry  Subjective: Nil new complaints. Improving.   Objective: Filed Vitals:   09/14/15 1236 09/14/15 2027 09/15/15 0017 09/15/15 0531  BP: 132/74 133/75 127/96 115/52  Pulse: 91 97 108 92  Temp: 98.2 F (36.8 C) 97.7 F (36.5 C) 98.1 F (36.7 C) 97.7 F (36.5 C)  TempSrc: Oral Oral Oral Oral  Resp: Height:      Weight:    55.883 kg (123 lb 3.2 oz)  SpO2: 93% 100% 100% 94%    Intake/Output Summary (Last 24 hours) at 09/15/15 1130 Last data filed at 09/15/15 0600  Gross per 24 hour  Intake    460 ml  Output    850 ml  Net   -390 ml   Filed Weights   09/13/15 2120 09/14/15 0426 09/15/15 0531  Weight: 58.877 kg (129 lb 12.8 oz) 57.607 kg (127 lb) 55.883 kg (123 lb 3.2 oz)    Examination:  General exam: Appears calm and comfortable. Pallor. Respiratory system: Decreased air entry.. Cardiovascular system: S1 & S2, irregular. Systolic murmur.  Gastrointestinal system: Abdomen is nondistended, soft and nontender. No organomegaly or masses felt. Normal bowel sounds heard.  Central nervous system: Alert and oriented. No focal neurological deficits. Extremities: Leg edema.  Data Reviewed: I have personally reviewed following labs and imaging  studies  CBC:  Recent Labs Lab 09/13/15 1621 09/13/15 2205 09/14/15 0431 09/15/15 0516  WBC 6.7  --  5.1 5.9  NEUTROABS 4.0  --   --  2.7  HGB 10.7* 10.1* 10.0* 11.0*  HCT 33.6* 31.7* 30.9* 33.8*  MCV 95.7  --  94.5 94.4  PLT 120*  --  117* 129*   Basic Metabolic Panel:  Recent Labs Lab 09/13/15 1621 09/14/15 0431 09/15/15 0513 09/15/15 0516  NA 145 146* 143 144  K 4.3 3.4* 3.9 3.9  CL 112* 108 105 105  CO2 21* 24 27 27   GLUCOSE 100* 81 92 94  BUN 17 12 14 14   CREATININE 1.16* 1.10* 1.03* 1.10*  CALCIUM 9.0 8.9 9.2 9.2  PHOS  --   --  3.1  --    GFR: Estimated Creatinine Clearance: 34.7 mL/min (by C-G formula based on Cr of 1.1). Liver Function Tests:  Recent Labs Lab 09/14/15 0431 09/15/15 0513  AST 18  --   ALT 16  --   ALKPHOS 64  --   BILITOT 0.8  --   PROT 5.2*  --   ALBUMIN 2.8* 2.7*   No results for input(s): LIPASE, AMYLASE in the last 168 hours. No results for input(s): AMMONIA in the last 168 hours. Coagulation Profile: No results for input(s): INR, PROTIME in the last 168 hours. Cardiac Enzymes:  Recent Labs Lab 09/13/15 1621 09/13/15 2205 09/14/15 0431  TROPONINI <0.03 <0.03 <0.03   BNP (last 3 results) No results for input(s): PROBNP in the last 8760 hours. HbA1C: No results for input(s): HGBA1C in the last 72 hours. CBG: No results for input(s): GLUCAP in the last 168 hours. Lipid Profile: No results for input(s): CHOL, HDL, LDLCALC, TRIG, CHOLHDL, LDLDIRECT in the last 72 hours. Thyroid Function Tests: No results for input(s): TSH, T4TOTAL, FREET4, T3FREE, THYROIDAB in the last 72 hours. Anemia Panel: No results for input(s): VITAMINB12, FOLATE, FERRITIN, TIBC, IRON, RETICCTPCT in the last 72 hours. Urine analysis:    Component Value Date/Time   COLORURINE AMBER* 09/13/2015 1741   APPEARANCEUR CLOUDY* 09/13/2015 1741   LABSPEC 1.012 09/13/2015 1741   PHURINE 5.5 09/13/2015 1741   GLUCOSEU NEGATIVE 09/13/2015 1741    HGBUR LARGE* 09/13/2015 1741   BILIRUBINUR NEGATIVE 09/13/2015 1741   BILIRUBINUR SMALL 09/03/2015 1104   KETONESUR NEGATIVE 09/13/2015 1741   PROTEINUR 30* 09/13/2015 1741   PROTEINUR 100 09/03/2015 1104   UROBILINOGEN 0.2 09/03/2015 1104   UROBILINOGEN 0.2 04/19/2012 0000   NITRITE NEGATIVE 09/13/2015 1741   NITRITE NEGATIVE 09/03/2015 1104   LEUKOCYTESUR TRACE* 09/13/2015 1741   Sepsis Labs: @LABRCNTIP (procalcitonin:4,lacticidven:4)  )No results found for this or any previous visit (from the past 240 hour(s)).   Radiology Studies: [place] Renal  09/14/2015  CLINICAL DATA:  [id number] year old female inpatient with gross hematuria. Chronic kidney disease. EXAM: RENAL / URINARY TRACT ULTRASOUND COMPLETE COMPARISON:  None. FINDINGS: Right Kidney: Length: 10.4 cm. Normal right renal parenchymal thickness and echogenicity. No right hydronephrosis. No right renal mass demonstrated. 8 x 5 x 4 mm shadowing nonobstructing stone in the right lower kidney. Incidentally noted right pleural effusion. Left Kidney: Length: 10.3 cm. Echogenicity within normal limits. No mass or hydronephrosis visualized. No left renal stones large enough to cause acoustic shadowing. Incidentally noted left pleural effusion. Bladder: Appears normal for degree of bladder distention. IMPRESSION:  1. Nonobstructing right renal stone. Otherwise normal kidneys, with no hydronephrosis. 2. Normal bladder. 3. Incidental bilateral pleural effusions. Electronically Signed   By: Delbert Phenix M.D.   On: 09/14/2015 12:02   Scheduled Meds: . atenolol  50 mg Oral Daily  . diclofenac sodium  2 g Topical QID  . estradiol  2 mg Oral Daily  . feeding supplement (ENSURE ENLIVE)  237 mL Oral BID BM  . furosemide  40 mg Intravenous BID  . levothyroxine  150 mcg Oral QAC breakfast  . mirabegron ER  25 mg Oral Daily  . multivitamin with minerals  1 tablet Oral Daily  . potassium chloride  20 mEq Oral TID  . sodium chloride flush  3 mL Intravenous Q12H    . traZODone  50 mg Oral QHS   Continuous Infusions:   Time spent: 30 Mins. Updated patient's daughter.    Barnetta Chapel, MD [facility] Pager [phone] If 7PM-7AM, please contact night-coverage [email or web address] Password [email or web address] 09/15/2015, 11:30 AM

## 2015-09-16 DIAGNOSIS — I482 Chronic atrial fibrillation: Secondary | ICD-10-CM | POA: Diagnosis not present

## 2015-09-16 DIAGNOSIS — D62 Acute posthemorrhagic anemia: Secondary | ICD-10-CM | POA: Diagnosis not present

## 2015-09-16 DIAGNOSIS — I5043 Acute on chronic combined systolic (congestive) and diastolic (congestive) heart failure: Secondary | ICD-10-CM

## 2015-09-16 DIAGNOSIS — R319 Hematuria, unspecified: Secondary | ICD-10-CM | POA: Diagnosis not present

## 2015-09-16 LAB — BASIC METABOLIC PANEL
Anion gap: 9 (ref 5–15)
BUN: 20 mg/dL (ref 6–20)
CO2: 30 mmol/L (ref 22–32)
CREATININE: 1.15 mg/dL — AB (ref 0.44–1.00)
Calcium: 9.3 mg/dL (ref 8.9–10.3)
Chloride: 101 mmol/L (ref 101–111)
GFR calc Af Amer: 53 mL/min — ABNORMAL LOW (ref >60)
GFR, EST NON AFRICAN AMERICAN: 45 mL/min — AB (ref >60)
GLUCOSE: 104 mg/dL — AB (ref 65–99)
POTASSIUM: 4 mmol/L (ref 3.5–5.1)
SODIUM: 140 mmol/L (ref 135–145)

## 2015-09-16 MED ORDER — ENSURE ENLIVE PO LIQD: 237.0000 mL | Freq: Two times a day (BID) | ORAL | Status: DC

## 2015-09-16 MED ORDER — MYRBETRIQ 25 MG PO TB24: 25.0000 mg | ORAL_TABLET | Freq: Every day | ORAL | Status: DC

## 2015-09-16 MED ORDER — DICLOFENAC SODIUM 1 % TD GEL: 2.0000 g | Freq: Four times a day (QID) | TRANSDERMAL | Status: DC

## 2015-09-16 MED ORDER — FUROSEMIDE 40 MG PO TABS: 40.0000 mg | ORAL_TABLET | Freq: Every day | ORAL | Status: DC

## 2015-09-16 NOTE — Discharge Summary (Signed)
Physician Discharge Summary  Patient ID: Rachel Keith MRN: [id number] DOB/AGE: 1941/03/02 75 y.o.  Admit date: 09/13/2015 Discharge date: 09/16/2015  Admission Diagnoses:  Discharge Diagnoses:  Principal Problem:   Acute exacerbation of CHF (congestive heart failure) (HCC) Active Problems:   Essential hypertension, benign   Hyperlipidemia   Anxiety   Hypothyroidism   Chronic kidney disease   Pleural effusion   Chronic atrial fibrillation (HCC)   Anemia   Discharged Condition: stable  Hospital Course: 75 y.o. female with medical history significant for atrial fibrillation, diagnosed earlier this year and started on Eliquis,AV/TV/MV moderate regurgitation, hypertension, pulmonary hypertension, thyroid disease, recurrent hematuria with history of Nephrolithiasis s/p urological procedures in the past to facilitate passing of kidney stones, presents with progressively worsening of shortness of breath for 1 month. Patient has been non compliant with the diuretics. Patient denies chest pain, palpitations, diaphoresis, nausea, PND, but complains of lower extremity edema and orthopnea.CXR also reveals pleural effusion. Anemia was noted on presentation. Patient was admitted for further assessment and management. Patient was adequately diuresed. With diuresis, the SOB resolved. Hemoglobin was noted to have dropped significantly from previous documentation. Patient's anticoagulation was held during hospital stay and the hematuria was noted to have resolved and the H/H stable. Renal ultrasound revealed non obstructing stone. As mentioned above, patient is known to the Urologist. Anticoagulation will be restarted, and the patient has been advised to follow up with the Urologist. Patient has also been advised to call the PCP if recurrent hematuria is noted. Patient is eager to be discharged back home.   Consults: None Significant Diagnostic Studies: EChO and Renal Ultrasound.  Discharge mediation - See the  Med Rec Treatments:   Discharge Exam: Blood pressure 99/59, pulse 59, temperature 97.5 F (36.4 C), temperature source Oral, resp. rate 18, height 5' (1.524 m), weight 53.524 kg (118 lb), SpO2 99 %.   Disposition: 01-Home or Self Care  Discharge Instructions    Call MD for:    Complete by:  As directed   Blood in urine as your blood thinner has been restarted     Diet - low sodium heart healthy    Complete by:  As directed      Discharge instructions    Complete by:  As directed   Follow up with PCP, Urology and Cardiology within 1 week of discharge     Increase activity slowly    Complete by:  As directed             Medication List    STOP taking these medications        metoprolol 100 MG tablet  Commonly known as:  LOPRESSOR      TAKE these medications        alfuzosin 10 MG 24 hr tablet  Commonly known as:  UROXATRAL  Take 10 mg by mouth daily.     ASPIRIN PO  Take 1 tablet by mouth daily as needed (pain).     atenolol 50 MG tablet  Commonly known as:  TENORMIN  Take 50 mg by mouth 2 (two) times daily.     diazepam 10 MG tablet  Commonly known as:  VALIUM  Take 1 tablet (10 mg total) by mouth daily as needed.     diclofenac sodium 1 % Gel  Commonly known as:  VOLTAREN  Apply 2 g topically 4 (four) times daily.     diltiazem 90 MG 12 hr capsule  Commonly known as:  CARDIZEM SR  Take  1 capsule (90 mg total) by mouth every 12 (twelve) hours.     ELIQUIS 5 MG Tabs tablet  Generic drug:  apixaban  TAKE 1 TABLET BY MOUTH TWICE A DAY     estradiol 2 MG tablet  Commonly known as:  ESTRACE  Take 1 tablet (2 mg total) by mouth daily.     feeding supplement (ENSURE ENLIVE) Liqd  Take 237 mLs by mouth 2 (two) times daily between meals.     furosemide 40 MG tablet  Commonly known as:  LASIX  Take 1 tablet (40 mg total) by mouth daily.     levothyroxine 150 MCG tablet  Commonly known as:  SYNTHROID, LEVOTHROID  Take 1 tablet (150 mcg total) by mouth  daily.     lisinopril 10 MG tablet  Commonly known as:  PRINIVIL,ZESTRIL  Take 10 mg by mouth daily.     multivitamin with minerals Tabs tablet  Take 1 tablet by mouth daily.     MYRBETRIQ 25 MG Tb24 tablet  Generic drug:  mirabegron ER  Take 1 tablet (25 mg total) by mouth daily.     traZODone 50 MG tablet  Commonly known as:  DESYREL  Take 0.5-1 tablets (25-50 mg total) by mouth at bedtime.           Follow-up Information    Follow up In 1 week.      Follow up with METHENEY,CATHERINE, MD.   Specialty:  Family Medicine   Why:  Follow up with Cardiology and Urology within one week. Call MD for presence of blood in urine please   Contact information:   [address] [place] [address] [phone]       Signed: Barnetta Chapel 09/16/2015, 1:20 PM

## 2015-09-16 NOTE — Progress Notes (Signed)
Pt discharged home IV and tele removed. Discharge instructions given daughter meeting pt at the main enterance. Questions answered regarding discharge plan.

## 2015-09-17 ENCOUNTER — Telehealth: Payer: Self-pay | Admitting: Family Medicine

## 2015-09-17 DIAGNOSIS — R319 Hematuria, unspecified: Secondary | ICD-10-CM

## 2015-09-17 NOTE — Telephone Encounter (Signed)
Dr. Denyse Amassorey can you please help this patient with a referral. Please see notes below this is pt's second call today. Thanks

## 2015-09-17 NOTE — Telephone Encounter (Signed)
Patient just got out [facility] 09[facility] and was adv that she needs a referral to a Urologist and she would like to know if [facility] are [facility]. Patient adv that [facility] found a Kidney Stone and when she urinates their is a little bit of blood she has an appt with Cardiologist this week and now needs a Insurance underwriterUrologist who knows what they are doing and does not want to go to [place] either so I advised pt that she wants a  [facility] Urologist maybe or Dr. Lindley [facility]kew [facility]. Thanks.

## 2015-09-17 NOTE — Telephone Encounter (Signed)
Urology referred

## 2015-09-21 ENCOUNTER — Encounter: Payer: Self-pay | Admitting: Family Medicine

## 2015-09-26 ENCOUNTER — Encounter: Payer: Self-pay | Admitting: *Deleted

## 2015-09-26 ENCOUNTER — Ambulatory Visit (INDEPENDENT_AMBULATORY_CARE_PROVIDER_SITE_OTHER): Payer: Commercial Managed Care - HMO | Admitting: Cardiology

## 2015-09-26 ENCOUNTER — Encounter: Payer: Self-pay | Admitting: Cardiology

## 2015-09-26 VITALS — BP 100/68 | HR 86 | Ht 60.0 in | Wt 117.4 lb

## 2015-09-26 DIAGNOSIS — I4891 Unspecified atrial fibrillation: Secondary | ICD-10-CM | POA: Diagnosis not present

## 2015-09-26 DIAGNOSIS — I340 Nonrheumatic mitral (valve) insufficiency: Secondary | ICD-10-CM | POA: Diagnosis not present

## 2015-09-26 DIAGNOSIS — I482 Chronic atrial fibrillation, unspecified: Secondary | ICD-10-CM

## 2015-09-26 DIAGNOSIS — Z87442 Personal history of urinary calculi: Secondary | ICD-10-CM

## 2015-09-26 DIAGNOSIS — I5043 Acute on chronic combined systolic (congestive) and diastolic (congestive) heart failure: Secondary | ICD-10-CM

## 2015-09-26 DIAGNOSIS — R55 Syncope and collapse: Secondary | ICD-10-CM | POA: Insufficient documentation

## 2015-09-26 DIAGNOSIS — I10 Essential (primary) hypertension: Secondary | ICD-10-CM

## 2015-09-26 NOTE — Progress Notes (Signed)
HPI: 75 yo female for evaluation of CHF and atrial fibrillation. Monitor 3/17 showed afib with rates 110-150 (performed at [facility]). 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 Chest xray 5/17 showed bilateral pleural effusions. Admitted 1/17 with new afib; admitted 5/17 with CHF; improved with diuresis. Patient has had no cardiac problems until January when she developed increasing dyspnea. She improved with therapy. She apparently had attempt at TEE guided cardio[place] in [place]. She apparently reverted immediately back to atrial fibrillation. I do not have those records available. She did well until she was noncompliant with her medications. These were reinstituted during recent admission including rate control medications and Lasix. She now denies dyspnea on exertion, orthopnea, PND, pedal edema, palpitations or chest pain. Prior to her recent admission she had a syncopal episode with no preceding symptoms. This was not orthostatic mediated.  Current Outpatient Prescriptions  Medication Sig Dispense Refill  . alfuzosin (UROXATRAL) 10 MG 24 hr tablet Take 10 mg by mouth daily.    . ASPIRIN PO Take 1 tablet by mouth daily as needed (pain).    Marland Kitchen. atenolol (TENORMIN) 50 MG tablet Take 50 mg by mouth 2 (two) times daily.    . diazepam (VALIUM) 10 MG tablet Take 1 tablet (10 mg total) by mouth daily as needed. (Patient taking differently: Take 10 mg by mouth daily as needed for anxiety. ) 90 tablet 0  . diltiazem (CARDIZEM SR) 90 MG 12 hr capsule Take 1 capsule (90 mg total) by mouth every 12 (twelve) hours. (Patient taking differently: Take 90 mg by mouth 2 (two) times daily. ) 60 capsule 5  . ELIQUIS 5 MG TABS tablet TAKE 1 TABLET BY MOUTH TWICE A DAY 60 tablet 0  . estradiol (ESTRACE) 2 MG tablet Take 1 tablet (2 mg total) by mouth daily. 90 tablet 1  . furosemide  (LASIX) 40 MG tablet Take 1 tablet (40 mg total) by mouth daily. 30 tablet 0  . levothyroxine (SYNTHROID, LEVOTHROID) 150 MCG tablet Take 1 tablet (150 mcg total) by mouth daily. 90 tablet 1  . lisinopril (PRINIVIL,ZESTRIL) 10 MG tablet Take 10 mg by mouth daily.    . Multiple Vitamin (MULTIVITAMIN WITH MINERALS) TABS tablet Take 1 tablet by mouth daily.    Marland Kitchen. MYRBETRIQ 25 MG TB24 tablet Take 1 tablet (25 mg total) by mouth daily. 30 tablet 0   No current facility-administered medications for this visit.    Allergies  Allergen Reactions  . Amlodipine Other (See Comments)    Didn't feel well on 2.5 mg dose.  Didn't feel well on 2.5 mg dose.   . Sulfa Antibiotics Rash    Broke out in her joints one time as a teenager  . Sulfacetamide Sodium Rash    Broke out in her joints one time as a teenager     Past Medical History  Diagnosis Date  . Hypertension   . Hypothyroidism   . High cholesterol   . Atrial fibrillation with RVR (HCC)   . Chronic kidney disease (CKD), stage III (moderate)     Rachel Keith/notes 05/28/2015  . Anemia   . History of blood transfusion 08/2015    "fell and split my head open; got 2 units"  . Anxiety   . Blood in the urine   . Nephrolithiasis     Past Surgical History  Procedure Laterality Date  . Cataract extraction w/  intraocular lens  implant, bilateral Bilateral 09/06/2010  . Abdominal hysterectomy    . Appendectomy    . Tonsillectomy    . Ankle fracture surgery Left ~ 2006  . Finger surgery Right     "had bone growing out on the side of little finger"  . Laparoscopic cholecystectomy    . Cystoscopy w/ stone manipulation  06/2015 X 2  . Fracture surgery    . Cardioversion  2017    /notes 07/26/2015    Social History   Social History  . Marital Status: Divorced    Spouse Name: N/A  . Number of Children: 4  . Years of Education: N/A   Occupational History  . retired    Social History Main Topics  . Smoking status: Never Smoker   . Smokeless  tobacco: Never Used  . Alcohol Use: 0.0 oz/week    0 Standard drinks or equivalent per week     Comment: 09/13/2015 "might have 1 drink/year"  . Drug Use: No  . Sexual Activity: No   Other Topics Concern  . Not on file   Social History Narrative   1 caffeinated drink daily     Family History  Problem Relation Age of Onset  . Alcohol abuse Mother   . Liver disease Mother   . Alcohol abuse Father     ROS: no fevers or chills, productive cough, hemoptysis, dysphasia, odynophagia, melena, hematochezia, dysuria, hematuria, rash, seizure activity, orthopnea, PND, pedal edema, claudication. Remaining systems are negative.  Physical Exam:   Blood pressure 100/68, pulse 86, height 5' (1.524 m), weight 117 lb 6.4 oz (53.252 kg).  General:  Well developed/well nourished in NAD Skin warm/dry Patient not depressed No peripheral clubbing Back-normal HEENT-normal/normal eyelids Neck supple/normal carotid upstroke bilaterally; no bruits; no JVD; no thyromegaly chest - CTA/ normal expansion CV - irregular/normal S1 and S2; no rubs or gallops;  PMI nondisplaced, 2/6 systolic murmur apex. Abdomen -NT/ND, no HSM, no mass, + bowel sounds, no bruit 2+ femoral pulses, no bruits Ext-no edema, chords, 2+ DP Neuro-grossly nonfocal  ECG 09/13/15-Atrial fibrillation, septal MI   

## 2015-09-26 NOTE — Assessment & Plan Note (Signed)
Patient remains in atrial fibrillation on examination. Continue metoprolol and Cardizem for rate control. Apparently attempts at previous cardioversion was unsuccessful. CHADSvasc 4. Continue apixaban. Check hgb and renal function.

## 2015-09-26 NOTE — Assessment & Plan Note (Signed)
Blood pressure controlled. Continue present medications. 

## 2015-09-26 NOTE — Assessment & Plan Note (Signed)
Patient's CHF is much improved.Continue present dose of Lasix. Check potassium and renal function. Continue present medications for rate control.

## 2015-09-26 NOTE — Patient Instructions (Signed)
Medication Instructions:   NO CHANGE  Labwork:  Your physician recommends that you return for lab work WHEN CONVENIENT   Testing/Procedures:  Your physician has requested that you have a TEE. During a TEE, sound waves are used to create images of your heart. It provides your doctor with information about the size and shape of your heart and how well your heart's chambers and valves are working. In this test, a transducer is attached to the end of a flexible tube that's guided down your throat and into your esophagus (the tube leading from you mouth to your stomach) to get a more detailed image of your heart. You are not awake for the procedure. Please see the instruction sheet given to you today. For further information please visit [email or web address].     Follow-Up:  Your physician recommends that you schedule a follow-up appointment in:  6 WEEKS WITH DR Jens SomRENSHAW

## 2015-09-26 NOTE — Assessment & Plan Note (Signed)
Etiology unclear.No recurrent episodes. We will consider a monitor if she has recurrent episodes in the future.

## 2015-09-26 NOTE — Assessment & Plan Note (Signed)
Patient apparently has had hematuria. She has aKidney stone that will need attention. Follow-up urology. Anticoagulation can be discontinued 2 days Prior to any procedure if needed.

## 2015-09-26 NOTE — Assessment & Plan Note (Signed)
Patient was noted to have mitral regurgitationOn recent echocardiogram felt related to rheumatic disease. It may be that her MR caused left atrial enlargement leading to her atrial fibrillation. We will arrange a transesophageal echocardiogram to assess degree of regurgitation and mechanism. She may require mitral valve replacement. If so she will need cardiac catheterization prior to the procedure. We could also consider a Maze procedure if surgical intervention necessary.

## 2015-09-27 ENCOUNTER — Other Ambulatory Visit: Payer: Self-pay | Admitting: Cardiology

## 2015-09-27 DIAGNOSIS — I340 Nonrheumatic mitral (valve) insufficiency: Secondary | ICD-10-CM

## 2015-09-27 LAB — CBC
HCT: 39.3 % (ref 35.0–45.0)
HEMOGLOBIN: 12.5 g/dL (ref 11.7–15.5)
MCH: 29.8 pg (ref 27.0–33.0)
MCHC: 31.8 g/dL — AB (ref 32.0–36.0)
MCV: 93.8 fL (ref 80.0–100.0)
MPV: 10.6 fL (ref 7.5–12.5)
PLATELETS: 194 10*3/uL (ref 140–400)
RBC: 4.19 MIL/uL (ref 3.80–5.10)
RDW: 14.4 % (ref 11.0–15.0)
WBC: 10.3 10*3/uL (ref 3.8–10.8)

## 2015-09-27 LAB — BASIC METABOLIC PANEL
BUN: 25 mg/dL (ref 7–25)
CALCIUM: 9.3 mg/dL (ref 8.6–10.4)
CHLORIDE: 98 mmol/L (ref 98–110)
CO2: 25 mmol/L (ref 20–31)
CREATININE: 1.27 mg/dL — AB (ref 0.60–0.93)
Glucose, Bld: 85 mg/dL (ref 65–99)
POTASSIUM: 3.5 mmol/L (ref 3.5–5.3)
SODIUM: 138 mmol/L (ref 135–146)

## 2015-09-28 ENCOUNTER — Telehealth: Payer: Self-pay | Admitting: *Deleted

## 2015-09-28 NOTE — Telephone Encounter (Signed)
-----   Message from Lewayne BuntingBrian S Crenshaw, MD sent at 09/28/2015  5:55 AM EDT ----- Hazle Cocak Brian Crenshaw

## 2015-09-28 NOTE — Telephone Encounter (Signed)
No answer , unaable to leave message  called to give result of  CBC, BMP

## 2015-10-01 ENCOUNTER — Telehealth: Payer: Self-pay | Admitting: Cardiology

## 2015-10-01 ENCOUNTER — Telehealth: Payer: Self-pay

## 2015-10-01 NOTE — Telephone Encounter (Signed)
Good, thank you.

## 2015-10-01 NOTE — Telephone Encounter (Signed)
Discussed recommendation for TEE with patient and in detail. Pt voiced understanding of intent of procedure and thanked me for the call. No further questions at this time and she states her questions were adequately and appropriately addressed.

## 2015-10-01 NOTE — Telephone Encounter (Signed)
New problem   Pt want to know why is she having the same procedure done 10/04/15 she had done while she was in the hospital. Please advise

## 2015-10-04 ENCOUNTER — Encounter (HOSPITAL_COMMUNITY)
Admission: RE | Disposition: A | Discharge status: Home / Self Care | Payer: Self-pay | Source: Ambulatory Visit | Attending: Cardiovascular Disease

## 2015-10-04 ENCOUNTER — Ambulatory Visit (HOSPITAL_COMMUNITY)
Admission: RE | Admit: 2015-10-04 | Discharge: 2015-10-04 | Disposition: A | Discharge status: Home / Self Care | Payer: Commercial Managed Care - HMO | Source: Ambulatory Visit | Attending: Cardiovascular Disease | Admitting: Cardiovascular Disease

## 2015-10-04 ENCOUNTER — Encounter (HOSPITAL_COMMUNITY): Payer: Self-pay | Admitting: *Deleted

## 2015-10-04 ENCOUNTER — Ambulatory Visit (HOSPITAL_BASED_OUTPATIENT_CLINIC_OR_DEPARTMENT_OTHER)
Admission: RE | Admit: 2015-10-04 | Discharge: 2015-10-04 | Disposition: A | Discharge status: Home / Self Care | Payer: Commercial Managed Care - HMO | Source: Ambulatory Visit | Attending: Cardiology | Admitting: Cardiology

## 2015-10-04 DIAGNOSIS — I083 Combined rheumatic disorders of mitral, aortic and tricuspid valves: Secondary | ICD-10-CM | POA: Diagnosis not present

## 2015-10-04 DIAGNOSIS — Z79890 Hormone replacement therapy (postmenopausal): Secondary | ICD-10-CM | POA: Insufficient documentation

## 2015-10-04 DIAGNOSIS — Z7901 Long term (current) use of anticoagulants: Secondary | ICD-10-CM | POA: Diagnosis not present

## 2015-10-04 DIAGNOSIS — E039 Hypothyroidism, unspecified: Secondary | ICD-10-CM | POA: Diagnosis not present

## 2015-10-04 DIAGNOSIS — I340 Nonrheumatic mitral (valve) insufficiency: Secondary | ICD-10-CM

## 2015-10-04 DIAGNOSIS — Z7982 Long term (current) use of aspirin: Secondary | ICD-10-CM | POA: Diagnosis not present

## 2015-10-04 DIAGNOSIS — I129 Hypertensive chronic kidney disease with stage 1 through stage 4 chronic kidney disease, or unspecified chronic kidney disease: Secondary | ICD-10-CM | POA: Diagnosis not present

## 2015-10-04 DIAGNOSIS — F419 Anxiety disorder, unspecified: Secondary | ICD-10-CM | POA: Insufficient documentation

## 2015-10-04 DIAGNOSIS — Z79899 Other long term (current) drug therapy: Secondary | ICD-10-CM | POA: Insufficient documentation

## 2015-10-04 DIAGNOSIS — N183 Chronic kidney disease, stage 3 (moderate): Secondary | ICD-10-CM | POA: Diagnosis not present

## 2015-10-04 DIAGNOSIS — I4891 Unspecified atrial fibrillation: Secondary | ICD-10-CM | POA: Diagnosis not present

## 2015-10-04 HISTORY — PX: TEE WITHOUT CARDIOVERSION: SHX5443

## 2015-10-04 SURGERY — ECHOCARDIOGRAM, TRANSESOPHAGEAL
Anesthesia: Moderate Sedation

## 2015-10-04 MED ORDER — FENTANYL CITRATE (PF) 100 MCG/2ML IJ SOLN
INTRAMUSCULAR | Status: DC | PRN
  Administered 2015-10-04 (×4): 25 ug via INTRAVENOUS

## 2015-10-04 MED ORDER — BUTAMBEN-TETRACAINE-BENZOCAINE 2-2-14 % EX AERO
INHALATION_SPRAY | CUTANEOUS | Status: DC | PRN
  Administered 2015-10-04: 2 via TOPICAL

## 2015-10-04 MED ORDER — MIDAZOLAM HCL 10 MG/2ML IJ SOLN
INTRAMUSCULAR | Status: DC | PRN
  Administered 2015-10-04: 1.5 mg via INTRAVENOUS
  Administered 2015-10-04 (×2): 2 mg via INTRAVENOUS
  Administered 2015-10-04: 1 mg via INTRAVENOUS
  Administered 2015-10-04: 2.5 mg via INTRAVENOUS

## 2015-10-04 MED ORDER — DIPHENHYDRAMINE HCL 50 MG/ML IJ SOLN
INTRAMUSCULAR | Status: AC
  Filled 2015-10-04: qty 1

## 2015-10-04 MED ORDER — SODIUM CHLORIDE 0.9 % IV SOLN: INTRAVENOUS | Status: DC

## 2015-10-04 MED ORDER — MIDAZOLAM HCL 5 MG/ML IJ SOLN
INTRAMUSCULAR | Status: AC
  Filled 2015-10-04: qty 2

## 2015-10-04 MED ORDER — DIPHENHYDRAMINE HCL 50 MG/ML IJ SOLN
INTRAMUSCULAR | Status: DC | PRN
  Administered 2015-10-04: 25 mg via INTRAVENOUS

## 2015-10-04 MED ORDER — FENTANYL CITRATE (PF) 100 MCG/2ML IJ SOLN
INTRAMUSCULAR | Status: AC
  Filled 2015-10-04: qty 2

## 2015-10-04 NOTE — Interval H&P Note (Signed)
History and Physical Interval Note:  10/04/2015 9:47 AM  Rachel DoffingElaine Keith  has presented today for surgery, with the diagnosis of MITRAL REGURGITATION  The various methods of treatment have been discussed with the patient and family. After consideration of risks, benefits and other options for treatment, the patient has consented to  Procedure(s): TRANSESOPHAGEAL ECHOCARDIOGRAM (TEE) (N/A) as a surgical intervention .  The patient's history has been reviewed, patient examined, no change in status, stable for surgery.  I have reviewed the patient's chart and labs.  Questions were answered to the patient's satisfaction.     Rebecca Cairns

## 2015-10-04 NOTE — H&P (View-Only) (Signed)
HPI: 75 yo female for evaluation of CHF and atrial fibrillation. Monitor 3/17 showed afib with rates 110-150 (performed at [facility]). 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 Chest xray 5/17 showed bilateral pleural effusions. Admitted 1/17 with new afib; admitted 5/17 with CHF; improved with diuresis. Patient has had no cardiac problems until January when she developed increasing dyspnea. She improved with therapy. She apparently had attempt at TEE guided cardio[place] She apparently reverted immediately back to atrial fibrillation. I do not have those records available. She did well until she was noncompliant with her medications. These were reinstituted during recent admission including rate control medications and Lasix. She now denies dyspnea on exertion, orthopnea, PND, pedal edema, palpitations or chest pain. Prior to her recent admission she had a syncopal episode with no preceding symptoms. This was not orthostatic mediated.  Current Outpatient Prescriptions  Medication Sig Dispense Refill  . alfuzosin (UROXATRAL) 10 MG 24 hr tablet Take 10 mg by mouth daily.    . ASPIRIN PO Take 1 tablet by mouth daily as needed (pain).    Marland Kitchen. atenolol (TENORMIN) 50 MG tablet Take 50 mg by mouth 2 (two) times daily.    . diazepam (VALIUM) 10 MG tablet Take 1 tablet (10 mg total) by mouth daily as needed. (Patient taking differently: Take 10 mg by mouth daily as needed for anxiety. ) 90 tablet 0  . diltiazem (CARDIZEM SR) 90 MG 12 hr capsule Take 1 capsule (90 mg total) by mouth every 12 (twelve) hours. (Patient taking differently: Take 90 mg by mouth 2 (two) times daily. ) 60 capsule 5  . ELIQUIS 5 MG TABS tablet TAKE 1 TABLET BY MOUTH TWICE A DAY 60 tablet 0  . estradiol (ESTRACE) 2 MG tablet Take 1 tablet (2 mg total) by mouth daily. 90 tablet 1  . furosemide  (LASIX) 40 MG tablet Take 1 tablet (40 mg total) by mouth daily. 30 tablet 0  . levothyroxine (SYNTHROID, LEVOTHROID) 150 MCG tablet Take 1 tablet (150 mcg total) by mouth daily. 90 tablet 1  . lisinopril (PRINIVIL,ZESTRIL) 10 MG tablet Take 10 mg by mouth daily.    . Multiple Vitamin (MULTIVITAMIN WITH MINERALS) TABS tablet Take 1 tablet by mouth daily.    Marland Kitchen. MYRBETRIQ 25 MG TB24 tablet Take 1 tablet (25 mg total) by mouth daily. 30 tablet 0   No current facility-administered medications for this visit.    Allergies  Allergen Reactions  . Amlodipine Other (See Comments)    Didn't feel well on 2.5 mg dose.  Didn't feel well on 2.5 mg dose.   . Sulfa Antibiotics Rash    Broke out in her joints one time as a teenager  . Sulfacetamide Sodium Rash    Broke out in her joints one time as a teenager     Past Medical History  Diagnosis Date  . Hypertension   . Hypothyroidism   . High cholesterol   . Atrial fibrillation with RVR (HCC)   . Chronic kidney disease (CKD), stage III (moderate)     [name]/notes 05/28/2015  . Anemia   . History of blood transfusion 08/2015    "fell and split my head open; got 2 units"  . Anxiety   . Blood in the urine   . Nephrolithiasis     Past Surgical History  Procedure Laterality Date  . Cataract extraction w/  intraocular lens  implant, bilateral Bilateral 09/06/2010  . Abdominal hysterectomy    . Appendectomy    . Tonsillectomy    . Ankle fracture surgery Left ~ 2006  . Finger surgery Right     "had bone growing out on the side of little finger"  . Laparoscopic cholecystectomy    . Cystoscopy w/ stone manipulation  06/2015 X 2  . Fracture surgery    . Cardioversion  2017    [name] 07/26/2015    Social History   Social History  . Marital Status: Divorced    Spouse Name: N/A  . Number of Children: 4  . Years of Education: N/A   Occupational History  . retired    Social History Main Topics  . Smoking status: Never Smoker   . Smokeless  tobacco: Never Used  . Alcohol Use: 0.0 oz/week    0 Standard drinks or equivalent per week     Comment: 09/13/2015 "might have 1 drink/year"  . Drug Use: No  . Sexual Activity: No   Other Topics Concern  . Not on file   Social History Narrative   1 caffeinated drink daily     Family History  Problem Relation Age of Onset  . Alcohol abuse Mother   . Liver disease Mother   . Alcohol abuse Father     ROS: no fevers or chills, productive cough, hemoptysis, dysphasia, odynophagia, melena, hematochezia, dysuria, hematuria, rash, seizure activity, orthopnea, PND, pedal edema, claudication. Remaining systems are negative.  Physical Exam:   Blood pressure 100/68, pulse 86, height 5' (1.524 m), weight 117 lb 6.4 oz (53.252 kg).  General:  Well developed/well nourished in NAD Skin warm/dry Patient not depressed No peripheral clubbing Back-normal HEENT-normal/normal eyelids Neck supple/normal carotid upstroke bilaterally; no bruits; no JVD; no thyromegaly chest - CTA/ normal expansion CV - irregular/normal S1 and S2; no rubs or gallops;  PMI nondisplaced, 2/6 systolic murmur apex. Abdomen -NT/ND, no HSM, no mass, + bowel sounds, no bruit 2+ femoral pulses, no bruits Ext-no edema, chords, 2+ DP Neuro-grossly nonfocal  ECG 09/13/15-Atrial fibrillation, septal MI

## 2015-10-04 NOTE — Progress Notes (Signed)
Echocardiogram Echocardiogram Transesophageal has been performed.  Rachel BasemanReel, Rachel Keith 10/04/2015, 11:43 AM

## 2015-10-04 NOTE — Discharge Instructions (Signed)

## 2015-10-04 NOTE — Op Note (Signed)
INDICATIONS: mitral  valve disease  PROCEDURE:   Informed consent was obtained prior to the procedure. The risks, benefits and alternatives for the procedure were discussed and the patient comprehended these risks.  Risks include, but are not limited to, cough, sore throat, vomiting, nausea, somnolence, esophageal and stomach trauma or perforation, bleeding, low blood pressure, aspiration, pneumonia, infection, trauma to the teeth and death.    After a procedural time-out, the oropharynx was anesthetized with 20% benzocaine spray.   During this procedure the patient was administered a total of Versed 9 mg and Fentanyl 100 mcg and diphenhydramine 25 mg IV to achieve and maintain moderate conscious sedation.  The patient's heart rate, blood pressure, and oxygen saturationweare monitored continuously during the procedure. The period of conscious sedation was 20 minutes, of which I was present face-to-face 100% of this time.  The transesophageal probe was inserted in the esophagus and stomach without difficulty and multiple views were obtained.  The patient was kept under observation until the patient left the procedure room.  The patient left the procedure room in stable condition.   Agitated microbubble saline contrast was not administered.  COMPLICATIONS:    There were no immediate complications.  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 Biatrial dilation . Normal right and left ventricular function.  Time Spent Directly with the Patient:  45minutes   Bonne Whack 10/04/2015, 11:20 AM

## 2015-10-05 ENCOUNTER — Encounter (HOSPITAL_COMMUNITY): Payer: Self-pay | Admitting: Cardiovascular Disease

## 2015-10-13 ENCOUNTER — Other Ambulatory Visit: Payer: Self-pay | Admitting: Family Medicine

## 2015-10-18 ENCOUNTER — Telehealth: Payer: Self-pay | Admitting: Family Medicine

## 2015-10-18 DIAGNOSIS — E038 Other specified hypothyroidism: Secondary | ICD-10-CM

## 2015-10-18 DIAGNOSIS — L659 Nonscarring hair loss, unspecified: Secondary | ICD-10-CM

## 2015-10-18 DIAGNOSIS — D649 Anemia, unspecified: Secondary | ICD-10-CM

## 2015-10-18 DIAGNOSIS — I10 Essential (primary) hypertension: Secondary | ICD-10-CM

## 2015-10-18 NOTE — Telephone Encounter (Signed)
Pt called clinic to schedule hospital follow up for kidney stone removal. While on the phone Pt ask if she could get her blood work completed before her appt. Pt wants her TSH and any labs that could determine why she is losing her hair checked specifically. Will route to PCP.

## 2015-10-19 NOTE — Telephone Encounter (Signed)
Pt advised lab orders placed.

## 2015-10-19 NOTE — Telephone Encounter (Signed)
Can check TSH, b12, folate, and cbc

## 2015-11-02 ENCOUNTER — Telehealth: Payer: Self-pay | Admitting: Cardiology

## 2015-11-02 NOTE — Telephone Encounter (Signed)
Received [facility] for appointment with Dr Jens Somrenshaw on 11/14/15.  [facility] Dr Ludwig Clarksrenshaw's schedule on 11/14/15. lp

## 2015-11-06 LAB — CBC WITH DIFFERENTIAL/PLATELET
BASOS ABS: 91 {cells}/uL (ref 0–200)
BASOS PCT: 1 %
EOS PCT: 3 %
Eosinophils Absolute: 273 cells/uL (ref 15–500)
HCT: 44 % (ref 35.0–45.0)
HEMOGLOBIN: 14.2 g/dL (ref 11.7–15.5)
LYMPHS ABS: 2639 {cells}/uL (ref 850–3900)
Lymphocytes Relative: 29 %
MCH: 30.2 pg (ref 27.0–33.0)
MCHC: 32.3 g/dL (ref 32.0–36.0)
MCV: 93.6 fL (ref 80.0–100.0)
MPV: 10.5 fL (ref 7.5–12.5)
Monocytes Absolute: 637 cells/uL (ref 200–950)
Monocytes Relative: 7 %
NEUTROS ABS: 5460 {cells}/uL (ref 1500–7800)
Neutrophils Relative %: 60 %
Platelets: 227 10*3/uL (ref 140–400)
RBC: 4.7 MIL/uL (ref 3.80–5.10)
RDW: 16.5 % — ABNORMAL HIGH (ref 11.0–15.0)
WBC: 9.1 10*3/uL (ref 3.8–10.8)

## 2015-11-06 LAB — TSH: TSH: 2.2 mIU/L

## 2015-11-06 LAB — VITAMIN B12: Vitamin B-12: 741 pg/mL (ref 200–1100)

## 2015-11-06 LAB — FOLATE

## 2015-11-06 NOTE — Progress Notes (Signed)
HPI: FU MR; seen recently for CHF and atrial fibrillation. Monitor 3/17 showed afib with rates 110-150 (performed at [facility]). 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 Chest xray 5/17 showed bilateral pleural effusions. Patient has had no cardiac problems until January when she developed increasing dyspnea. She improved with therapy. She apparently had attempt at TEE guided [place]. She apparently reverted immediately back to atrial fibrillation. I do not have those records available. Patient [facility] 2017 that showed normal LV systolic function, mild to moderate aortic insufficiency, moderate mitral regurgitation, moderate left atrial and right atrial enlargement and mild right ventricular enlargement. Moderate to severe tricuspid regurgitation. Since last seen, the patient denies any dyspnea on exertion, orthopnea, PND, pedal edema, palpitations, syncope or chest pain.   Current Outpatient Prescriptions  Medication Sig Dispense Refill  . atenolol (TENORMIN) 50 MG tablet Take 50 mg by mouth 2 (two) times daily.    . Biotin 1610910000 MCG TBDP Take 1 capsule by mouth daily.    . diazepam (VALIUM) 10 MG tablet Take 1 tablet (10 mg total) by mouth daily as needed for anxiety. 90 tablet 0  . diltiazem (CARDIZEM SR) 90 MG 12 hr capsule Take 1 capsule (90 mg total) by mouth every 12 (twelve) hours. 1 capsule 0  . ELIQUIS 5 MG TABS tablet TAKE 1 TABLET BY MOUTH TWICE A DAY 60 tablet 0  . estradiol (ESTRACE) 2 MG tablet Take 2 mg by mouth daily.    . fluconazole (DIFLUCAN) 150 MG tablet Take 1 tablet (150 mg total) by mouth once. 1 tablet 0  . furosemide (LASIX) 40 MG tablet Take 1 tablet (40 mg total) by mouth daily. 30 tablet 0  . levothyroxine (SYNTHROID, LEVOTHROID) 150 MCG tablet Take 1 tablet (150 mcg total) by  mouth daily. 90 tablet 1  . lisinopril (PRINIVIL,ZESTRIL) 10 MG tablet Take 10 mg by mouth daily.    . metroNIDAZOLE (FLAGYL) 500 MG tablet Take 1 tablet (500 mg total) by mouth 2 (two) times daily. 14 tablet 0  . Multiple Vitamin (MULTIVITAMIN WITH MINERALS) TABS tablet Take 1 tablet by mouth daily.    . Potassium 99 MG TABS Take 1 tablet by mouth daily.    . traZODone (DESYREL) 50 MG tablet Take 0.5-2 tablets (25-100 mg total) by mouth at bedtime as needed for sleep. 60 tablet 1   No current facility-administered medications for this visit.     Past Medical History  Diagnosis Date  . Hypertension   . Hypothyroidism   . High cholesterol   . Atrial fibrillation with RVR (HCC)   . Chronic kidney disease (CKD), stage III (moderate)     [name]/notes 05/28/2015  . Anemia   . History of blood transfusion 08/2015    "fell and split my head open; got 2 units"  . Anxiety   . Blood in the urine   . Nephrolithiasis     Past Surgical History  Procedure Laterality Date  . Cataract extraction w/ intraocular lens  implant, bilateral Bilateral 09/06/2010  . Abdominal hysterectomy    . Appendectomy    . Tonsillectomy    . Ankle fracture surgery Left ~ 2006  . Finger surgery Right     "had bone growing out on the side of little finger"  . Laparoscopic cholecystectomy    . Cystoscopy w/ stone manipulation  06/2015 X 2  . Fracture surgery    . Cardioversion  2017    [name]/notes 07/26/2015  . Tee without cardioversion N/A 10/04/2015    Procedure: TRANSESOPHAGEAL ECHOCARDIOGRAM (TEE);  Surgeon: Thurmon FairMihai Croitoru, MD;  [facility];  Service: Cardiovascular;  Laterality: N/A;    Social History   Social History  . Marital Status: Divorced    Spouse Name: N/A  . Number of Children: 4  . Years of Education: N/A   Occupational History  . retired    Social History Main Topics  . Smoking status: Never Smoker   . Smokeless tobacco: Never Used  . Alcohol Use: 0.0 oz/week    0 Standard drinks or  equivalent per week     Comment: 09/13/2015 "might have 1 drink/year"  . Drug Use: No  . Sexual Activity: No   Other Topics Concern  . Not on file   Social History Narrative   1 caffeinated drink daily     Family History  Problem Relation Age of Onset  . Alcohol abuse Mother   . Liver disease Mother   . Alcohol abuse Father     ROS: no fevers or chills, productive cough, hemoptysis, dysphasia, odynophagia, melena, hematochezia, dysuria, hematuria, rash, seizure activity, orthopnea, PND, pedal edema, claudication. Remaining systems are negative.  Physical Exam: Well-developed well-nourished in no acute distress.  Skin is warm and dry.  HEENT is normal.  Neck is supple.  Chest is clear to auscultation with normal expansion.  Cardiovascular exam is irregular Abdominal exam nontender or distended. No masses palpated. Extremities show no edema. neuro grossly intact  ECG 09/23/2015-atrial fibrillation, anterior infarct.  Assessment and plan 1 Permanent atrial fibrillation-continue metoprolol and Cardizem for rate control. Previous attempts at cardioversion unsuccessful. Continue apixaban. Check hemoglobin and renal function. 2 Mitral regurgitation-recent transesophageal echocardiogram showed moderate mitral regurgitation, mild to moderate aortic insufficiency and moderate to severe tricuspid regurgitation. She is much improved. She will need follow-up echoes in the future and I have explained that she may require valve surgery as well if her valvular heart disease worsens. 3 Chronic diastolic congestive heart failure-volume status much improved. Continue present dose of Lasix. Check potassium and renal function. 4 Hypertension-blood pressure controlled. Continue present medications. 5 Syncope-no further episodes.  Olga MillersBrian Crenshaw, MD

## 2015-11-07 ENCOUNTER — Ambulatory Visit: Payer: Commercial Managed Care - HMO | Admitting: Cardiology

## 2015-11-12 ENCOUNTER — Ambulatory Visit (INDEPENDENT_AMBULATORY_CARE_PROVIDER_SITE_OTHER): Payer: Commercial Managed Care - HMO | Admitting: Family Medicine

## 2015-11-12 ENCOUNTER — Encounter: Payer: Self-pay | Admitting: Family Medicine

## 2015-11-12 VITALS — BP 122/54 | HR 77 | Wt 120.0 lb

## 2015-11-12 DIAGNOSIS — G4700 Insomnia, unspecified: Secondary | ICD-10-CM

## 2015-11-12 DIAGNOSIS — N760 Acute vaginitis: Secondary | ICD-10-CM

## 2015-11-12 DIAGNOSIS — I4891 Unspecified atrial fibrillation: Secondary | ICD-10-CM | POA: Diagnosis not present

## 2015-11-12 DIAGNOSIS — I10 Essential (primary) hypertension: Secondary | ICD-10-CM

## 2015-11-12 DIAGNOSIS — F419 Anxiety disorder, unspecified: Secondary | ICD-10-CM

## 2015-11-12 MED ORDER — TRAZODONE HCL 50 MG PO TABS: 25.0000 mg | ORAL_TABLET | Freq: Every evening | ORAL | Status: DC | PRN

## 2015-11-12 MED ORDER — DIAZEPAM 10 MG PO TABS: 10.0000 mg | ORAL_TABLET | Freq: Every day | ORAL | Status: DC | PRN

## 2015-11-12 MED ORDER — DILTIAZEM HCL ER 90 MG PO CP12: 90.0000 mg | ORAL_CAPSULE | Freq: Two times a day (BID) | ORAL | Status: DC

## 2015-11-12 MED ORDER — AMBULATORY NON FORMULARY MEDICATION: Status: DC

## 2015-11-12 NOTE — Progress Notes (Addendum)
Subjective:    CC: Hospital F/U   HPI:  Here for hospital follow-up. She just had cystoscopy performed on 10/18/2015.she has kidney stones.  She is actually feeling much better. Her pain is better. She says her appetite is coming back some.  Hair loss - she would like to go back on her compounded estrogen therapy instead of the estradiol that she is getting from [facility]. She says it worked much better and has noticed significant hair loss and switching.    She would like something for insomnia - she said she's having difficulty sleeping for several months. She's just been under a lot of stress with her health conditions. She says she's having difficulty mostly falling asleep. Once she falls asleep she can usually stay asleep. She typically falls asleep by about midnight and then gets up around 5:30 in the morning. She has never taken a sleep aid. She says she's tried over-the-counter medications including aspirin, anti-inflammatories, Benadryl etc. Nothing seems to really help. She does avoid caffeine after lunch time.  Anxiety-she's been under a lot of stress this year with her health conditions. Also her older son who has bipolar disorder has been living with her for almost a year. She said she has finally told him that he has to get his own vehicle over the next 3 weeks and then he has to find a new place to live by August. She says she's just at the point where she can't really take it anymore it's causing a lot of emotional stress in the household. She admits she has been using her Valium a little bit more frequently lately. I gave her 90 tabs about 6 months ago. She is requesting a refill today.  She has also had some vaginal itching. Mostly external. She switched soaps asn think it could be an contact allergy.    Past medical history, Surgical history, Family history not pertinant except as noted below, Social history, Allergies, and medications have been entered into the medical record, reviewed,  and corrections made.   Review of Systems: No fevers, chills, night sweats, weight loss, chest pain, or shortness of breath.   Objective:    General: Well Developed, well nourished, and in no acute distress.  Neuro: Alert and oriented x3, extra-ocular muscles intact, sensation grossly intact.  HEENT: Normocephalic, atraumatic  Skin: Warm and dry, no rashes. Cardiac: Regular rate and rhythm, no murmurs rubs or gallops, no lower extremity edema.  Respiratory: Clear to auscultation bilaterally. Not using accessory muscles, speaking in full sentences.   Impression and Recommendations:   Hair loss-we'll restart compounded estrogen.  Anxiety-did repeat her Valium today to use as needed. Reminded her again about the potential for habit forming and addiction. Make sure to use sparingly. Next  Insomnia-work on techniques to prove sleep quality. Avoiding caffeine. Making sure that bedroom is dark cool and quiet. Did encourage her to stop taking her aspirin. We'll try a small trial of trazodone. Start with half a tab and then okay to increase to 1 tab if needed.  Warned about potential of sedation. Be very careful driving until she knows how she feels on the medication.    Atrial fibrillation-seeing cardiology next week. She has been consistent with her diuretic and her beta blocker as well as her ACE inhibitor.  Vaginal itching - likely contact dermatitis. Wet prep collected today.  Will call with results once available.  Wet prep pos for yeast and BV.  Will tx with diflucan and metronidazole.

## 2015-11-12 NOTE — Patient Instructions (Addendum)
Stop Your Aspirin. We will try the trazodone for sleep.   Insomnia Insomnia is a sleep disorder that makes it difficult to fall asleep or to stay asleep. Insomnia can cause tiredness (fatigue), low energy, difficulty concentrating, mood swings, and poor performance at work or school.  There are three different ways to classify insomnia:  Difficulty falling asleep.  Difficulty staying asleep.  Waking up too early in the morning. Any type of insomnia can be long-term (chronic) or short-term (acute). Both are common. Short-term insomnia usually lasts for three months or less. Chronic insomnia occurs at least three times a week for longer than three months. CAUSES  Insomnia may be caused by another condition, situation, or substance, such as:  Anxiety.  Certain medicines.  Gastroesophageal reflux disease (GERD) or other gastrointestinal conditions.  Asthma or other breathing conditions.  Restless legs syndrome, sleep apnea, or other sleep disorders.  Chronic pain.  Menopause. This may include hot flashes.  Stroke.  Abuse of alcohol, tobacco, or illegal drugs.  Depression.  Caffeine.   Neurological disorders, such as Alzheimer disease.  An overactive thyroid (hyperthyroidism). The cause of insomnia may not be known. RISK FACTORS Risk factors for insomnia include:  Gender. Women are more commonly affected than men.  Age. Insomnia is more common as you get older.  Stress. This may involve your professional or personal life.  Income. Insomnia is more common in people with lower income.  Lack of exercise.   Irregular work schedule or night shifts.  Traveling between different time zones. SIGNS AND SYMPTOMS If you have insomnia, trouble falling asleep or trouble staying asleep is the main symptom. This may lead to other symptoms, such as:  Feeling fatigued.  Feeling nervous about going to sleep.  Not feeling rested in the morning.  Having trouble  concentrating.  Feeling irritable, anxious, or depressed. TREATMENT  Treatment for insomnia depends on the cause. If your insomnia is caused by an underlying condition, treatment will focus on addressing the condition. Treatment may also include:   Medicines to help you sleep.  Counseling or therapy.  Lifestyle adjustments. HOME CARE INSTRUCTIONS   Take medicines only as directed by your health care provider.  Keep regular sleeping and waking hours. Avoid naps.  Keep a sleep diary to help you and your health care provider figure out what could be causing your insomnia. Include:   When you sleep.  When you wake up during the night.  How well you sleep.   How rested you feel the next day.  Any side effects of medicines you are taking.  What you eat and drink.   Make your bedroom a comfortable place where it is easy to fall asleep:  Put up shades or special blackout curtains to block light from outside.  Use a white noise machine to block noise.  Keep the temperature cool.   Exercise regularly as directed by your health care provider. Avoid exercising right before bedtime.  Use relaxation techniques to manage stress. Ask your health care provider to suggest some techniques that may work well for you. These may include:  Breathing exercises.  Routines to release muscle tension.  Visualizing peaceful scenes.  Cut back on alcohol, caffeinated beverages, and cigarettes, especially close to bedtime. These can disrupt your sleep.  Do not overeat or eat spicy foods right before bedtime. This can lead to digestive discomfort that can make it hard for you to sleep.  Limit screen use before bedtime. This includes:  Watching TV.  Using your smartphone, tablet, and computer.  Stick to a routine. This can help you fall asleep faster. Try to do a quiet activity, brush your teeth, and go to bed at the same time each night.  Get out of bed if you are still awake after 15  minutes of trying to sleep. Keep the lights down, but try reading or doing a quiet activity. When you feel sleepy, go back to bed.  Make sure that you drive carefully. Avoid driving if you feel very sleepy.  Keep all follow-up appointments as directed by your health care provider. This is important. SEEK MEDICAL CARE IF:   You are tired throughout the day or have trouble in your daily routine due to sleepiness.  You continue to have sleep problems or your sleep problems get worse. SEEK IMMEDIATE MEDICAL CARE IF:   You have serious thoughts about hurting yourself or someone else.   This information is not intended to replace advice given to you by your health care provider. Make sure you discuss any questions you have with your health care provider.   Document Released: 04/25/2000 Document Revised: 01/17/2015 Document Reviewed: 01/27/2014 Elsevier Interactive Patient Education [employer] Inc.

## 2015-11-13 LAB — WET PREP, GENITAL: TRICH WET PREP: NONE SEEN

## 2015-11-13 MED ORDER — FLUCONAZOLE 150 MG PO TABS: 150.0000 mg | ORAL_TABLET | Freq: Once | ORAL | Status: DC

## 2015-11-13 MED ORDER — METRONIDAZOLE 500 MG PO TABS: 500.0000 mg | ORAL_TABLET | Freq: Two times a day (BID) | ORAL | Status: DC

## 2015-11-13 NOTE — Addendum Note (Signed)
Addended by: Nani GasserMETHENEY, CATHERINE D on: 11/13/2015 09:17 AM   Modules accepted: Orders

## 2015-11-14 ENCOUNTER — Ambulatory Visit (INDEPENDENT_AMBULATORY_CARE_PROVIDER_SITE_OTHER): Payer: Commercial Managed Care - HMO | Admitting: Cardiology

## 2015-11-14 ENCOUNTER — Encounter: Payer: Self-pay | Admitting: Cardiology

## 2015-11-14 ENCOUNTER — Telehealth: Payer: Self-pay

## 2015-11-14 VITALS — BP 103/67 | HR 62 | Ht 60.0 in | Wt 117.0 lb

## 2015-11-14 DIAGNOSIS — I5032 Chronic diastolic (congestive) heart failure: Secondary | ICD-10-CM | POA: Diagnosis not present

## 2015-11-14 DIAGNOSIS — I4891 Unspecified atrial fibrillation: Secondary | ICD-10-CM

## 2015-11-14 DIAGNOSIS — I340 Nonrheumatic mitral (valve) insufficiency: Secondary | ICD-10-CM

## 2015-11-14 DIAGNOSIS — I482 Chronic atrial fibrillation, unspecified: Secondary | ICD-10-CM

## 2015-11-14 DIAGNOSIS — I10 Essential (primary) hypertension: Secondary | ICD-10-CM | POA: Diagnosis not present

## 2015-11-14 NOTE — Patient Instructions (Signed)
Medication Instructions:   NO CHANGE  Labwork:  Your physician recommends that you HAVE LAB WORK TODAY  Follow-Up:  Your physician wants you to follow-up in: 6 MONTHS WITH DR CRENSHAW You will receive a reminder letter in the mail two months in advance. If you don't receive a letter, please call our office to schedule the follow-up appointment.      

## 2015-11-14 NOTE — Telephone Encounter (Signed)
[facility] called and state they received Estriol 3.2 mg and it should have been a combination of Estradiol 1.6 mg/ Estriol 3.2 mg. It needs to be faxed to [phone]

## 2015-11-15 LAB — CBC
HCT: 42.8 % (ref 35.0–45.0)
HEMOGLOBIN: 13.8 g/dL (ref 11.7–15.5)
MCH: 30.6 pg (ref 27.0–33.0)
MCHC: 32.2 g/dL (ref 32.0–36.0)
MCV: 94.9 fL (ref 80.0–100.0)
MPV: 10.1 fL (ref 7.5–12.5)
PLATELETS: 197 10*3/uL (ref 140–400)
RBC: 4.51 MIL/uL (ref 3.80–5.10)
RDW: 16.4 % — ABNORMAL HIGH (ref 11.0–15.0)
WBC: 7.9 10*3/uL (ref 3.8–10.8)

## 2015-11-15 LAB — BASIC METABOLIC PANEL
BUN: 24 mg/dL (ref 7–25)
CALCIUM: 9.3 mg/dL (ref 8.6–10.4)
CO2: 23 mmol/L (ref 20–31)
CREATININE: 1.15 mg/dL — AB (ref 0.60–0.93)
Chloride: 98 mmol/L (ref 98–110)
GLUCOSE: 116 mg/dL — AB (ref 65–99)
Potassium: 3.7 mmol/L (ref 3.5–5.3)
Sodium: 138 mmol/L (ref 135–146)

## 2015-11-15 MED ORDER — AMBULATORY NON FORMULARY MEDICATION: Status: DC

## 2015-11-15 NOTE — Telephone Encounter (Signed)
Look under the old non-ambulatory medications and okay to refill the combination product.

## 2015-11-15 NOTE — Telephone Encounter (Signed)
Medication printed

## 2015-12-06 ENCOUNTER — Telehealth: Payer: Self-pay | Admitting: Family Medicine

## 2015-12-06 NOTE — Telephone Encounter (Signed)
I agree she needs to call their office for continued pain medication. Please help her understand that with [facility] laws it's important that she gets the pain medication from the same provider. Otherwise it can look suspicious on the [facility] registry. Certainly if she feels like she needs more chronic pain management then we can refer her to a pain management center. And typically we don't use chronic narcotics for back pain and left but is more severe.  Nani Gasser, MD

## 2015-12-06 NOTE — Telephone Encounter (Signed)
Pt called clinic requesting and Rx for Hydrocodone. Pt states she got an Rx for this last month from Dr. Michell Heinrich when he removed the rest of her kidney stone. Advised Pt if he wrote the Rx, he should be the one to follow it especially if she is still in pain. Pt reports she is having back pain "here and there" and takes a pill "only when she hurts" Pt reports she "owes so much in medical bills" that she doesn't want another speciality co-pay for the Rx. Open to another option of pain management if PCP recommends one. Will route.

## 2015-12-07 NOTE — Telephone Encounter (Signed)
Left VM for Pt to return clinic call.  

## 2015-12-26 ENCOUNTER — Telehealth: Payer: Self-pay | Admitting: Emergency Medicine

## 2015-12-26 ENCOUNTER — Other Ambulatory Visit: Payer: Self-pay | Admitting: Emergency Medicine

## 2015-12-26 DIAGNOSIS — E038 Other specified hypothyroidism: Secondary | ICD-10-CM

## 2015-12-26 MED ORDER — LEVOTHYROXINE SODIUM 150 MCG PO TABS: 150.0000 ug | ORAL_TABLET | Freq: Every day | ORAL | 1 refills | Status: DC

## 2015-12-26 MED ORDER — AMBULATORY NON FORMULARY MEDICATION: 1 refills | Status: DC

## 2016-01-07 ENCOUNTER — Other Ambulatory Visit: Payer: Self-pay | Admitting: Family Medicine

## 2016-01-23 ENCOUNTER — Ambulatory Visit (INDEPENDENT_AMBULATORY_CARE_PROVIDER_SITE_OTHER): Payer: Commercial Managed Care - HMO

## 2016-01-23 ENCOUNTER — Encounter: Payer: Self-pay | Admitting: Family Medicine

## 2016-01-23 ENCOUNTER — Ambulatory Visit (INDEPENDENT_AMBULATORY_CARE_PROVIDER_SITE_OTHER): Payer: Commercial Managed Care - HMO | Admitting: Family Medicine

## 2016-01-23 ENCOUNTER — Telehealth: Payer: Self-pay | Admitting: *Deleted

## 2016-01-23 VITALS — BP 131/50 | HR 89 | Ht 60.0 in | Wt 127.0 lb

## 2016-01-23 DIAGNOSIS — L908 Other atrophic disorders of skin: Secondary | ICD-10-CM

## 2016-01-23 DIAGNOSIS — Z1231 Encounter for screening mammogram for malignant neoplasm of breast: Secondary | ICD-10-CM | POA: Diagnosis not present

## 2016-01-23 DIAGNOSIS — L659 Nonscarring hair loss, unspecified: Secondary | ICD-10-CM | POA: Diagnosis not present

## 2016-01-23 DIAGNOSIS — G4700 Insomnia, unspecified: Secondary | ICD-10-CM

## 2016-01-23 DIAGNOSIS — I4891 Unspecified atrial fibrillation: Secondary | ICD-10-CM | POA: Diagnosis not present

## 2016-01-23 DIAGNOSIS — L988 Other specified disorders of the skin and subcutaneous tissue: Secondary | ICD-10-CM

## 2016-01-23 MED ORDER — APIXABAN 5 MG PO TABS: 5.0000 mg | ORAL_TABLET | Freq: Two times a day (BID) | ORAL | 3 refills | Status: DC

## 2016-01-23 MED ORDER — AMBULATORY NON FORMULARY MEDICATION: 3 refills | Status: DC

## 2016-01-23 MED ORDER — TRETINOIN 0.025 % EX CREA: TOPICAL_CREAM | Freq: Every day | CUTANEOUS | 0 refills | Status: DC

## 2016-01-23 NOTE — Telephone Encounter (Signed)
lvm informing pt that samples of Eliquis are up front for her to p/u at her convenience.Loralee PacasBarkley, [name] [place]

## 2016-01-23 NOTE — Telephone Encounter (Signed)
Patient asst. Form completed, faxed, copied and scanned. Confirmation received.Rachel PacasBarkley, Rachel Keith \

## 2016-01-23 NOTE — Progress Notes (Addendum)
Subjective:    CC:   HPI:  Insomnia - Currently on trazodone and doing well overall. No specific complaints. Tolerating the medication well.  Alopecia - She still continues to lose a lot of hair especially on the size of her scalp and at the top. She would like to go back on her previous hormone replacement therapy.  Afib- she cannot afford the Eliquis bc she is in the donut hole.  She started to run out so she started taking it once a day instead of twice a day and now she is completely out of medication. Overall she is feeling much better. She's been able to work out in the yard and even got up and down her stairs in her home without difficulty. She is excited that she's actually feeling normal again.  Wrinkles-she's to have a prescription for tretinoin cream. The tooth that she brought in with her today is 76 years old she says she uses it very sparingly but would like a refill on it.  Past medical history, Surgical history, Family history not pertinant except as noted below, Social history, Allergies, and medications have been entered into the medical record, reviewed, and corrections made.   Review of Systems: No fevers, chills, night sweats, weight loss, chest pain, or shortness of breath.   Objective:    General: Well Developed, well nourished, and in no acute distress.  Neuro: Alert and oriented x3, extra-ocular muscles intact, sensation grossly intact.  HEENT: Normocephalic, atraumatic  Skin: Warm and dry, no rashes. She does have a lot of thinning of hair behind her ears.  Cardiac: Regular rate with irregular rhythm. No murmurs. No induction edema. Respiratory: Clear to auscultation bilaterally. Not using accessory muscles, speaking in full sentences.   Impression and Recommendations:    alopecia-we can go back to previous dose on her hormone replacement therapy. She does have a hysterectomy. But I did expand her that she needs to make sure she is up-to-date on her mammogram to  continue on hormone replacement. I will go ahead and place an order today and she agrees to go downstairs. Will change her back to Bi-est. Will fax to [facility].    Atrial fibrillation-we'll call the pharmaceutical representative to see if we can get samples for a few weeks. I gave her the forms to complete to see if she qualifies for patient assistance and she is now technically underinsured should she has had her Medicare gap.  Insomnia - and T current regimen. Continue to monitor.  Wrinkles-will prescribe tretinoin cream. They likely will not be covered by insurance.  Will schedule mammogram.

## 2016-01-28 NOTE — Telephone Encounter (Signed)
Forms re-faxed with pt's income.Loralee PacasBarkley, Rashaad Hallstrom [place]

## 2016-01-29 ENCOUNTER — Telehealth: Payer: Self-pay | Admitting: Family Medicine

## 2016-01-29 MED ORDER — AMBULATORY NON FORMULARY MEDICATION: 3 refills | Status: DC

## 2016-01-29 MED ORDER — TRETINOIN 0.0375 % EX CREA: TOPICAL_CREAM | CUTANEOUS | 0 refills | Status: DC

## 2016-01-29 NOTE — Telephone Encounter (Signed)
Ok to change tretinoin to 0.03%.  I wasn't aware she wanted it to go to [facility].  Also ok to change the Biest the the mg as noted below.

## 2016-01-29 NOTE — Telephone Encounter (Signed)
Received phone call from [facility]:  1. The Rx for Tretinoin 0.025% [facility], would like sent to [facility]. Also, questions if there was a dosage change because the last one they have on file was for Tretinoin 0.03%.  2. Pt request the Biest to be in capsule form. Per pharmacy, pt was previously taking 1.6mg  of estradiol and 3.2mg  of estriol capsule. Will route for review.

## 2016-01-29 NOTE — Telephone Encounter (Signed)
New Rx's printed and will be faxed.

## 2016-04-14 ENCOUNTER — Ambulatory Visit: Payer: Commercial Managed Care - HMO | Admitting: Family Medicine

## 2016-04-18 ENCOUNTER — Telehealth: Payer: Self-pay | Admitting: Family Medicine

## 2016-04-18 NOTE — Telephone Encounter (Signed)
Spoke with Pt, she will come in next week to complete form for renewal of Pt assistance program.

## 2016-05-22 ENCOUNTER — Ambulatory Visit (INDEPENDENT_AMBULATORY_CARE_PROVIDER_SITE_OTHER): Payer: Medicare HMO | Admitting: Family Medicine

## 2016-05-22 ENCOUNTER — Encounter: Payer: Self-pay | Admitting: Family Medicine

## 2016-05-22 VITALS — BP 120/60 | HR 44 | Ht 60.0 in | Wt 135.0 lb

## 2016-05-22 DIAGNOSIS — M5441 Lumbago with sciatica, right side: Secondary | ICD-10-CM | POA: Diagnosis not present

## 2016-05-22 DIAGNOSIS — I10 Essential (primary) hypertension: Secondary | ICD-10-CM

## 2016-05-22 DIAGNOSIS — M5431 Sciatica, right side: Secondary | ICD-10-CM

## 2016-05-22 DIAGNOSIS — I951 Orthostatic hypotension: Secondary | ICD-10-CM | POA: Diagnosis not present

## 2016-05-22 MED ORDER — TRAZODONE HCL 50 MG PO TABS: 25.0000 mg | ORAL_TABLET | Freq: Every evening | ORAL | 1 refills | Status: DC | PRN

## 2016-05-22 MED ORDER — TRAMADOL HCL 50 MG PO TABS: 50.0000 mg | ORAL_TABLET | Freq: Three times a day (TID) | ORAL | 0 refills | Status: DC | PRN

## 2016-05-22 MED ORDER — DIAZEPAM 10 MG PO TABS: 10.0000 mg | ORAL_TABLET | Freq: Every day | ORAL | 0 refills | Status: DC | PRN

## 2016-05-22 MED ORDER — DILTIAZEM HCL ER 60 MG PO CP12: 60.0000 mg | ORAL_CAPSULE | Freq: Two times a day (BID) | ORAL | 1 refills | Status: DC

## 2016-05-22 NOTE — Progress Notes (Signed)
Subjective:    CC: HTN  HPI: Hypertension- Pt denies chest pain, SOB, dizziness, or heart palpitations.  Taking meds as directed w/o problems.  Denies medication side effects.    She has been feeling dizzy and light headed.  Symptoms have been mostly when she gets up from a lying down position. She has been having some allergy symptoms more recently. She denies any visual changes.  She is also concerned about hair loss. She says she has a strip on the right side of her head but she seems to be losing hair. She had a friend check it and she said she did see some small little hairs growing in.  She says she's also had some pain in her right leg starting at her outer hip and going all the way down to the outer part of her foot. She said she has been having some discomfort in her low back as well. She says she really just can't seem to get comfortable at night which is when it mostly bothers her. Not so much during the day. It doesn't bother her with walking. She says if she lays on her side or on her back is still just not comfortable. No trauma or injury or exacerbating factors.  Past medical history, Surgical history, Family history not pertinant except as noted below, Social history, Allergies, and medications have been entered into the medical record, reviewed, and corrections made.   Review of Systems: No fevers, chills, night sweats, weight loss, chest pain, or shortness of breath.   Objective:    General: Well Developed, well nourished, and in no acute distress.  Neuro: Alert and oriented x3, extra-ocular muscles intact, sensation grossly intact.  HEENT: Normocephalic, atraumatic  Skin: Warm and dry, no rashes. Cardiac: Regular rate and rhythm, no murmurs rubs or gallops, no lower extremity edema.  Respiratory: Clear to auscultation bilaterally. Not using accessory muscles, speaking in full sentences. Musculoskeletal-normal flexion extension of the lumbar spine. MR rotation at the waist.  Negative straight leg raise bilaterally, hip, knee, ankle strength is 5 out of 5 bilaterally. Patellar reflexes 1+ bilaterally. She is tender over the right SI joint. Nontender over the lumbar spine.   Impression and Recommendations:   Hypertension- Well controlled. Continue current regimen. Follow up in  6 mo.   Orthostatic hypotension-he did have a significant drop in her blood pressure is almost 30 points from sitting to standing.I think this really explains her dizziness. In fact her blood pressures look great today and she actually skipped all of her blood pressure medications last night so suspect her pressure is probably dropping even lower at home and her diastolics are quite low as well. And this is probably contributing to some of the fatigue she's been experiencing as well. I like her to come back in 2 weeks for nurse visit. I'm decreasing her lisinopril to half a tab and decreasing her diltiazem down to 60 mg from 90. Will need to monitor to make sure that her pulse rate doesn't increase.  A1C of 5.5 which is normal. Reassuring.   Low back pain/right sciatica - offered to do xray but declined at this.  We'll start with just some stretches and exercises for the lumbar spine to do on her own at home for physical therapy. She really cannot take NSAIDs. Thus will give tramadol as needed for pain relief at this point in time. Sitter x-ray if she's not improving or more formal physical therapy.

## 2016-05-22 NOTE — Patient Instructions (Signed)
Change diltiazem to 60mg  twice a day.  Cut your lisinopril in half and take half a tab daily Work on exercises sciatica.

## 2016-06-30 ENCOUNTER — Other Ambulatory Visit: Payer: Self-pay

## 2016-06-30 DIAGNOSIS — E038 Other specified hypothyroidism: Secondary | ICD-10-CM

## 2016-06-30 MED ORDER — LEVOTHYROXINE SODIUM 150 MCG PO TABS: 150.0000 ug | ORAL_TABLET | Freq: Every day | ORAL | 0 refills | Status: DC

## 2016-07-01 ENCOUNTER — Ambulatory Visit (INDEPENDENT_AMBULATORY_CARE_PROVIDER_SITE_OTHER): Payer: Medicare HMO | Admitting: Family Medicine

## 2016-07-01 VITALS — BP 126/74 | HR 79 | Ht 60.0 in | Wt 137.1 lb

## 2016-07-01 DIAGNOSIS — I10 Essential (primary) hypertension: Secondary | ICD-10-CM | POA: Diagnosis not present

## 2016-07-01 DIAGNOSIS — R001 Bradycardia, unspecified: Secondary | ICD-10-CM | POA: Diagnosis not present

## 2016-07-01 NOTE — Progress Notes (Signed)
Agree with above.  Patient was complaining of fatigue and lightheadedness so we'll try decreasing her atenolol to see if this helps. But if it increases her heart rate remaining to get back to a whole tab.  Nani Gasseratherine Metheney, MD

## 2016-07-01 NOTE — Progress Notes (Signed)
   Subjective:    Patient ID: Rachel Keith, female    DOB: [birth year], 76 y.o.   MRN: [id number]  HPI Pt is here for a BP check. Pt states she is still experiencing lightheadedness, and is tired. Pt states no chest pain or shortness of breath.    Review of Systems  Respiratory: Negative for shortness of breath.   Cardiovascular: Negative for chest pain.  Neurological: Positive for light-headedness.       Objective:   Physical Exam        Assessment & Plan:  Dr. Linford ArnoldMetheney changed atenolol to 1/2 tab 2 x daily. Pt advised to schedule a nurse visit for a BP recheck in 2 weeks.

## 2016-07-03 ENCOUNTER — Other Ambulatory Visit: Payer: Self-pay

## 2016-07-03 MED ORDER — APIXABAN 5 MG PO TABS: 5.0000 mg | ORAL_TABLET | Freq: Two times a day (BID) | ORAL | 3 refills | Status: DC

## 2016-07-15 ENCOUNTER — Telehealth: Payer: Self-pay | Admitting: Family Medicine

## 2016-07-15 ENCOUNTER — Ambulatory Visit (INDEPENDENT_AMBULATORY_CARE_PROVIDER_SITE_OTHER): Payer: Medicare HMO | Admitting: Family Medicine

## 2016-07-15 VITALS — BP 128/86 | HR 77 | Ht 60.0 in | Wt 137.0 lb

## 2016-07-15 DIAGNOSIS — I10 Essential (primary) hypertension: Secondary | ICD-10-CM

## 2016-07-15 MED ORDER — LOSARTAN POTASSIUM 25 MG PO TABS: 25.0000 mg | ORAL_TABLET | Freq: Every day | ORAL | 5 refills | Status: DC

## 2016-07-15 NOTE — Telephone Encounter (Signed)
Call pt: her Eliquis is not covered through patient Assistance because her insurance does cover it.  She may qualify [facility].  How much is her copay on the Eliquis?  Nani Gasseratherine Breona Cherubin, MD

## 2016-07-15 NOTE — Progress Notes (Signed)
   Subjective:    Patient ID: Rachel Keith, female    DOB: 02-02-1941, 76 y.o.   MRN: [id number]  HPI Pt is here for a BP recheck. Pt denies chest pain, lightheadedness, dizziness, shortness of breath, or palpitations. Pt states that she has not been taking her lisinopril for months because she thinks it is causing her to lose her hair. Pt states her hair is still falling out.   Review of Systems  Respiratory: Negative for shortness of breath.   Cardiovascular: Negative for chest pain and palpitations.       Objective:   Physical Exam        Assessment & Plan:  Pt advised to schedule a follow-up with provider over hair loss and BP recheck.

## 2016-07-15 NOTE — Progress Notes (Signed)
Hypertension-blood pressure actually looks well controlled that her diastolic is a little elevated but still at goal technically. When I was unable to intolerance list she feels like it is causing some hair loss. We'll continue to monitor carefully. A need to consider putting her on an ARB.  Rachel Gasseratherine Asser Lucena, MD

## 2016-07-15 NOTE — Telephone Encounter (Signed)
LVM requesting pt to call the office.  

## 2016-07-15 NOTE — Telephone Encounter (Signed)
Pt informed. Pt expressed understanding and is agreeable. Amanda Grissom CMA, RT 

## 2016-07-15 NOTE — Telephone Encounter (Signed)
Call patient: We reviewed listening April from her medication list. She does seem to be on some type of ACE inhibitor or arm. The lisinopril is an ACE inhibitor so lets try an arm because of her diagnosis of heart failure. We'll send a prescription to the pharmacy and will send low dose.  Nani Gasseratherine Metheney, MD

## 2016-07-16 ENCOUNTER — Other Ambulatory Visit: Payer: Self-pay

## 2016-07-16 MED ORDER — APIXABAN 5 MG PO TABS: 5.0000 mg | ORAL_TABLET | Freq: Two times a day (BID) | ORAL | 3 refills | Status: DC

## 2016-07-16 NOTE — Telephone Encounter (Signed)
Pt informed she does not know what the copay is.Loralee PacasBarkley, Trisa Cranor [place]

## 2016-07-21 ENCOUNTER — Ambulatory Visit (INDEPENDENT_AMBULATORY_CARE_PROVIDER_SITE_OTHER): Payer: Medicare HMO | Admitting: Family Medicine

## 2016-07-21 ENCOUNTER — Encounter: Payer: Self-pay | Admitting: Family Medicine

## 2016-07-21 VITALS — BP 123/63 | HR 93 | Wt 140.0 lb

## 2016-07-21 DIAGNOSIS — E038 Other specified hypothyroidism: Secondary | ICD-10-CM | POA: Diagnosis not present

## 2016-07-21 DIAGNOSIS — I4891 Unspecified atrial fibrillation: Secondary | ICD-10-CM | POA: Diagnosis not present

## 2016-07-21 DIAGNOSIS — Z13220 Encounter for screening for lipoid disorders: Secondary | ICD-10-CM | POA: Diagnosis not present

## 2016-07-21 DIAGNOSIS — L659 Nonscarring hair loss, unspecified: Secondary | ICD-10-CM | POA: Diagnosis not present

## 2016-07-21 LAB — BASIC METABOLIC PANEL WITH GFR
BUN: 17 mg/dL (ref 7–25)
CALCIUM: 10 mg/dL (ref 8.6–10.4)
CO2: 31 mmol/L (ref 20–31)
Chloride: 100 mmol/L (ref 98–110)
Creat: 1.11 mg/dL — ABNORMAL HIGH (ref 0.60–0.93)
GFR, EST AFRICAN AMERICAN: 56 mL/min — AB (ref >=60)
GFR, EST NON AFRICAN AMERICAN: 48 mL/min — AB (ref >=60)
Glucose, Bld: 79 mg/dL (ref 65–99)
Potassium: 3.8 mmol/L (ref 3.5–5.3)
SODIUM: 142 mmol/L (ref 135–146)

## 2016-07-21 LAB — LIPID PANEL W/REFLEX DIRECT LDL
CHOL/HDL RATIO: 3.8 ratio (ref <5.0)
Cholesterol: 167 mg/dL (ref <200)
HDL: 44 mg/dL — ABNORMAL LOW (ref >50)
LDL-Cholesterol: 86 mg/dL
NON-HDL CHOLESTEROL (CALC): 123 mg/dL (ref <130)
Triglycerides: 308 mg/dL — ABNORMAL HIGH (ref <150)

## 2016-07-21 NOTE — Progress Notes (Signed)
   Subjective:    Patient ID: Rachel Keith, female    DOB: Jan 19, 1941, 76 y.o.   MRN: [id number]  HPI 76 -year-old female comes in today complaining of hair loss. This is been a recurrent complaint throughout the years that have known her. She recently wanted to discontinue her lisinopril because she was concerned that it was actually triggering some more recent hair loss. So we did try switching her to losartan. This was approximately 1 week ago.  Mostly in the back of her scalp.   Hypothyroidism-we will recently sent a refill to the pharmacy for levothyroxin but she actually says she is taking compounded thyroid medication.  Atrial fibrillation-she is only taking her Cardizem once a day because she says it makes her feel lightheaded. She is only taking it at bedtime. We had decreased her dose of Cardizem down to 60 mg and she says that has helped.  Review of Systems     Objective:   Physical Exam  Constitutional: She is oriented to person, place, and time. She appears well-developed and well-nourished.  HENT:  Head: Normocephalic and atraumatic.  Cardiovascular: Normal rate, regular rhythm and normal heart sounds.   Pulmonary/Chest: Effort normal and breath sounds normal.  Neurological: She is alert and oriented to person, place, and time.  Skin: Skin is warm and dry.  Psychiatric: She has a normal mood and affect. Her behavior is normal.          Assessment & Plan:  Hair loss-Discussed options. I did let her know that now that she has stopped the lisinopril it can take up to 3 months for her to see Improvement.  In addition she can try Rogaine for women explained that only works longer using it though.  Hypothyroidism-due to recheck TSH.  Atrial fibrillation-did encourage her to discuss her medication with her cardiologist. I am pretty certain they are not aware that she is only taking her Cardizem once a day.  Due for screening lipid panel.

## 2016-07-21 NOTE — Patient Instructions (Signed)
Can try Rogaine for women. It is over the counter.

## 2016-07-22 LAB — TSH: TSH: 3.91 m[IU]/L

## 2016-08-06 IMAGING — DX DG CHEST 2V
2 series · 2 of 2 positions shown · non-contrast
Comparison: None.

CLINICAL DATA: 74-year-old female with shortness of breath for the
past week

EXAM:
CHEST  2 VIEW

[chest ap (1 of 2)]
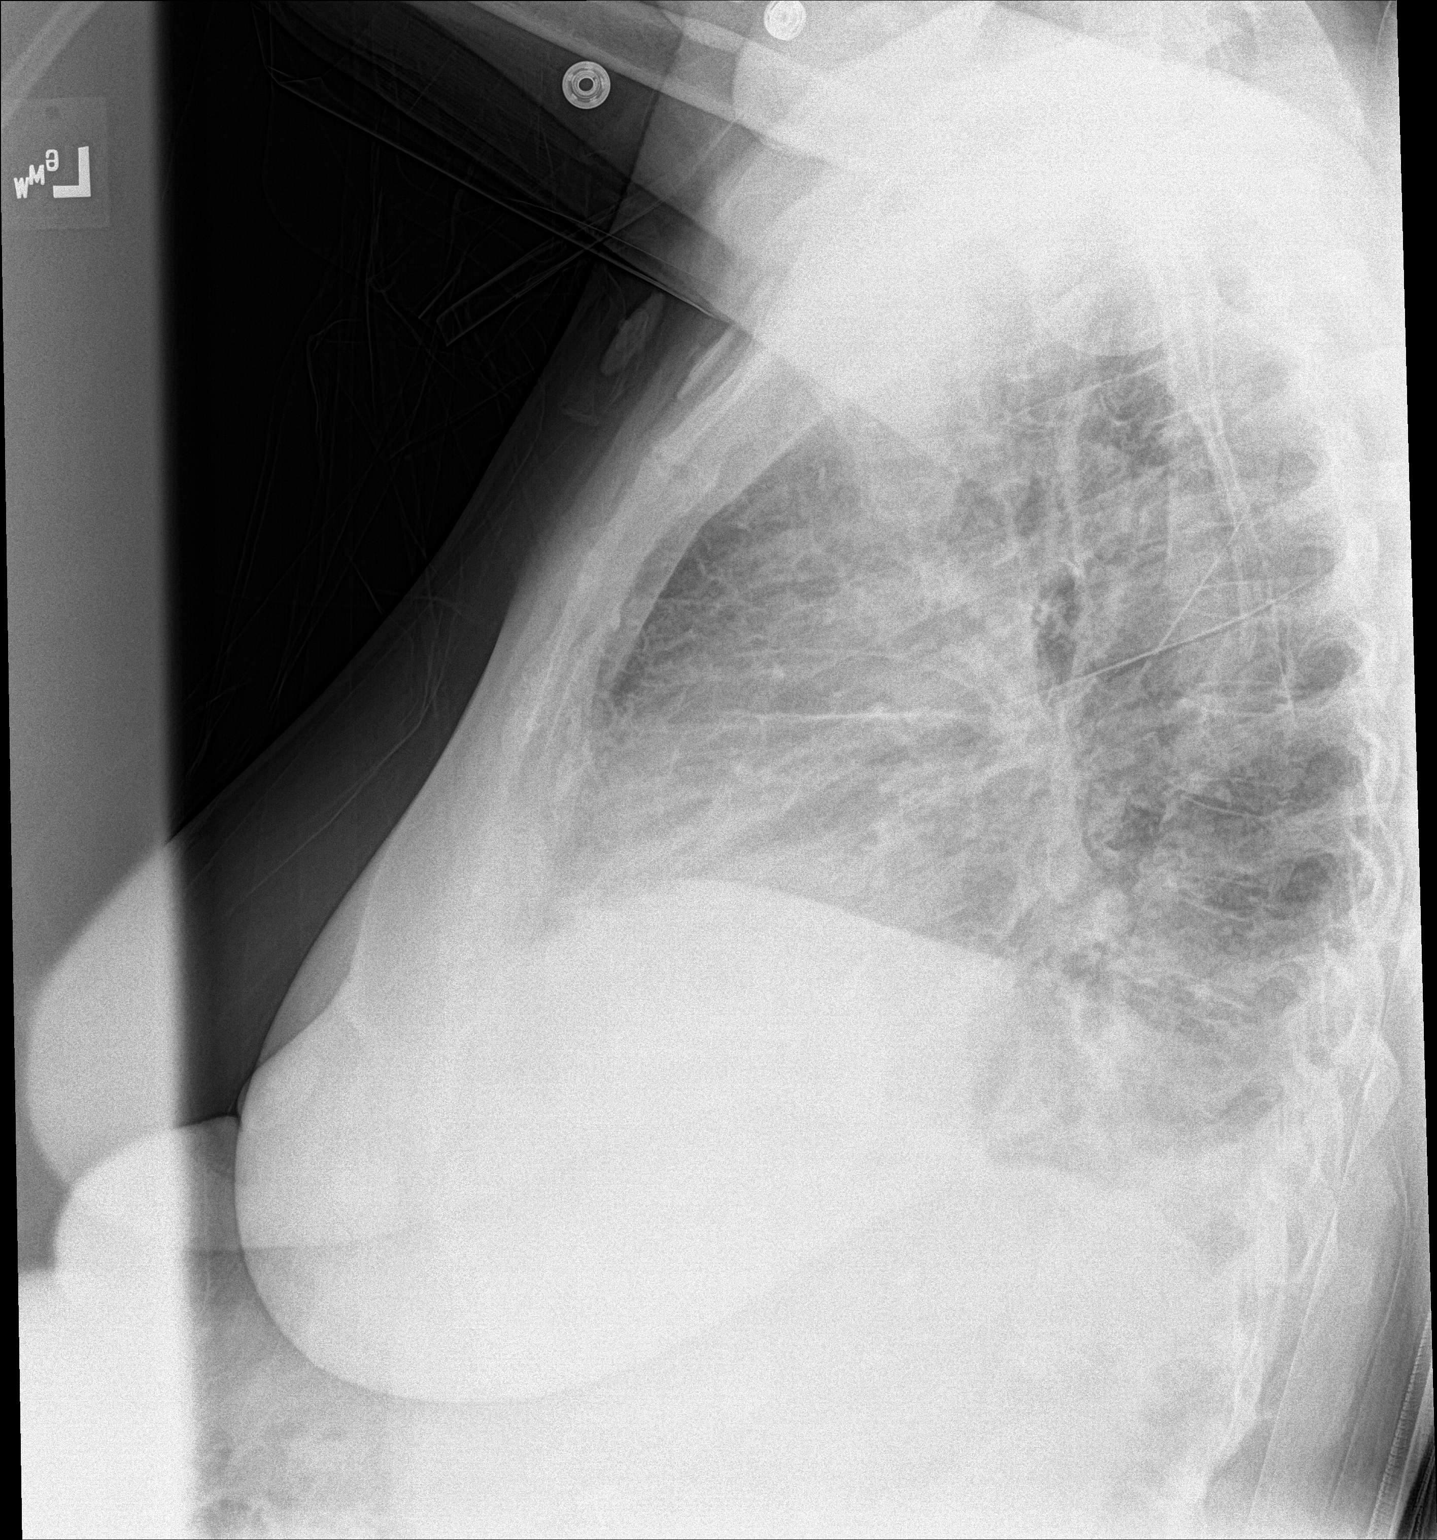

[chest ap (2 of 2)]
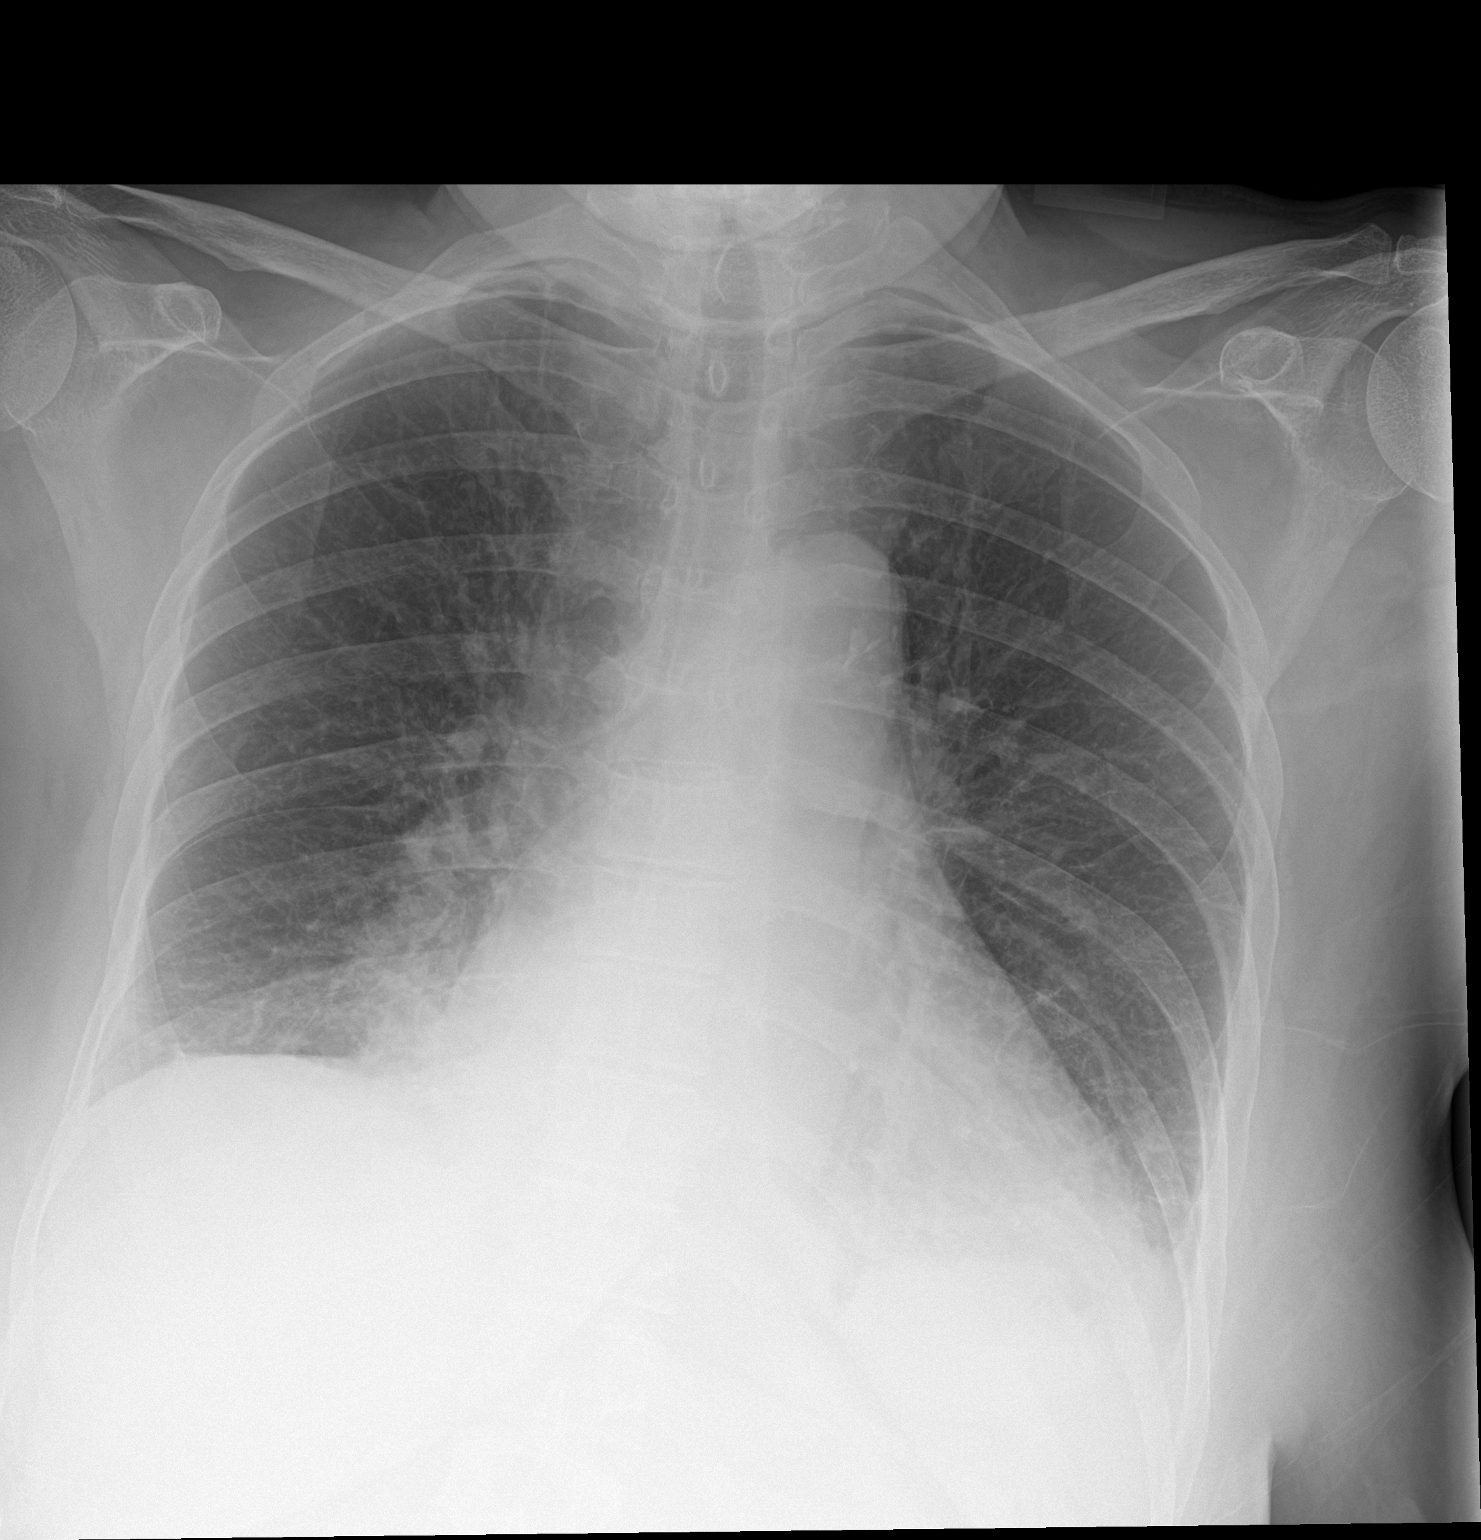

[2 of 2 positions shown; findings below may reference images not displayed]

FINDINGS: Enlargement of the cardiopericardial silhouette. The lateral view is
limited by probable expiratory phase timing. There are bibasilar
interstitial opacities slightly more confluent on the left than
right. Trace thickening of the fissures most visible on the lateral
view. Atherosclerotic calcifications present in the transverse
aorta. No pneumothorax. Suspect small left pleural effusion. No
acute osseous abnormality.
IMPRESSION: 1. Mild bibasilar interstitial prominence more prominent on the left
than the right which may reflect atelectasis, early dependent edema,
or less likely bilateral lower lobe infiltrates. Atelectasis is
favored.
2. Suspect small layering left pleural effusion.
3. Cardiomegaly.
4. Aortic atherosclerosis.

## 2016-08-09 ENCOUNTER — Other Ambulatory Visit: Payer: Self-pay | Admitting: Family Medicine

## 2016-11-04 ENCOUNTER — Other Ambulatory Visit: Payer: Self-pay | Admitting: Family Medicine

## 2016-11-06 ENCOUNTER — Other Ambulatory Visit: Payer: Self-pay | Admitting: Family Medicine

## 2016-11-07 ENCOUNTER — Telehealth: Payer: Self-pay | Admitting: Family Medicine

## 2016-11-07 ENCOUNTER — Other Ambulatory Visit: Payer: Self-pay

## 2016-11-07 MED ORDER — DIAZEPAM 10 MG PO TABS: 10.0000 mg | ORAL_TABLET | Freq: Every day | ORAL | 0 refills | Status: DC | PRN

## 2016-11-07 NOTE — Telephone Encounter (Signed)
I called pt and informed her that she is due this month for a F/u appt with Dr.Metheney for Hypothyroidism and HTN. Patient states that she can not afford to come this month and she will call back later to schedule

## 2016-11-22 IMAGING — DX DG CHEST 2V
2 series · 2 of 2 positions shown · non-contrast
Comparison: Portable chest x-ray May 29, 2015

CLINICAL DATA: Cough and shortness of breath for the past 3-4
months; history of hypertension.

EXAM:
CHEST  2 VIEW

[chest pa]
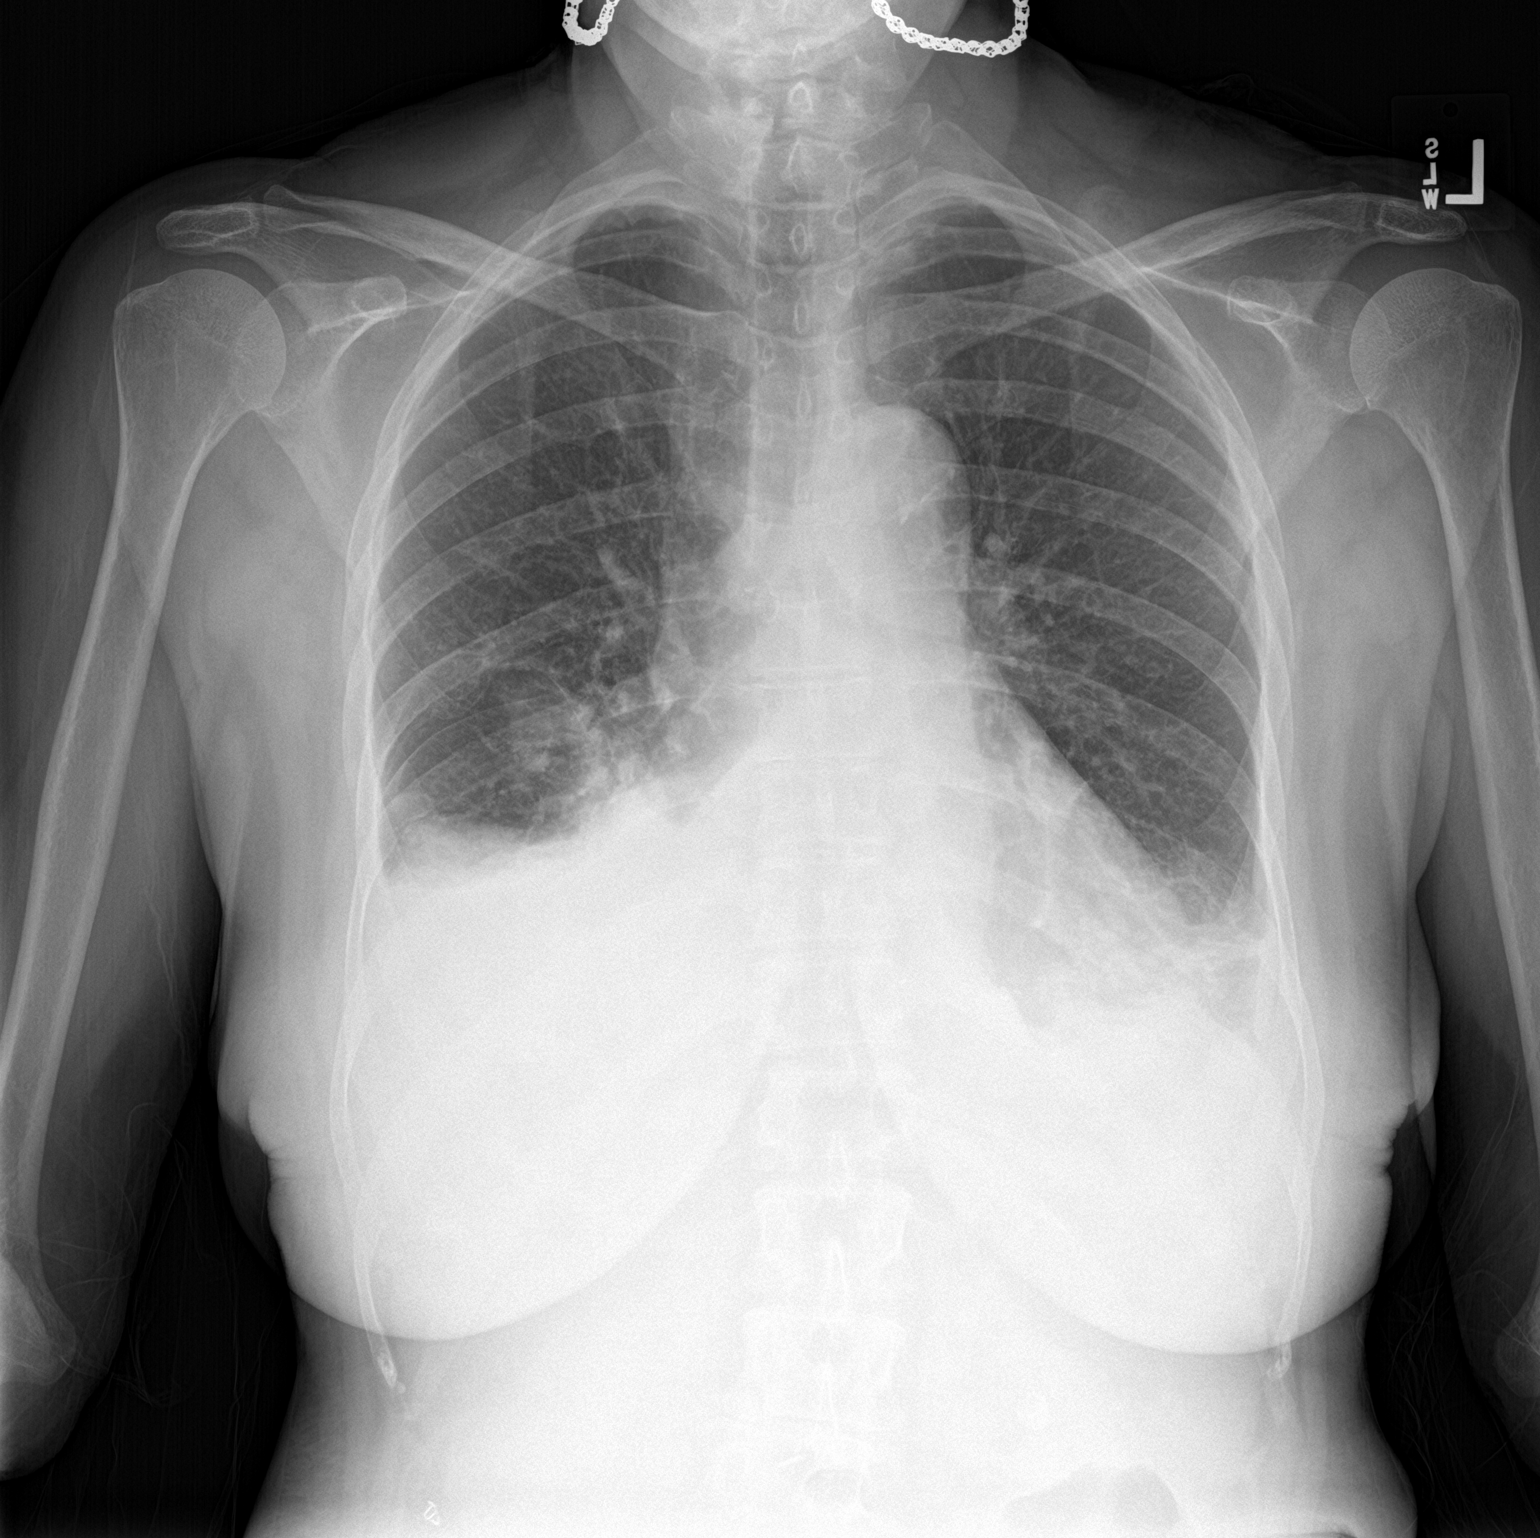

[chest lat]
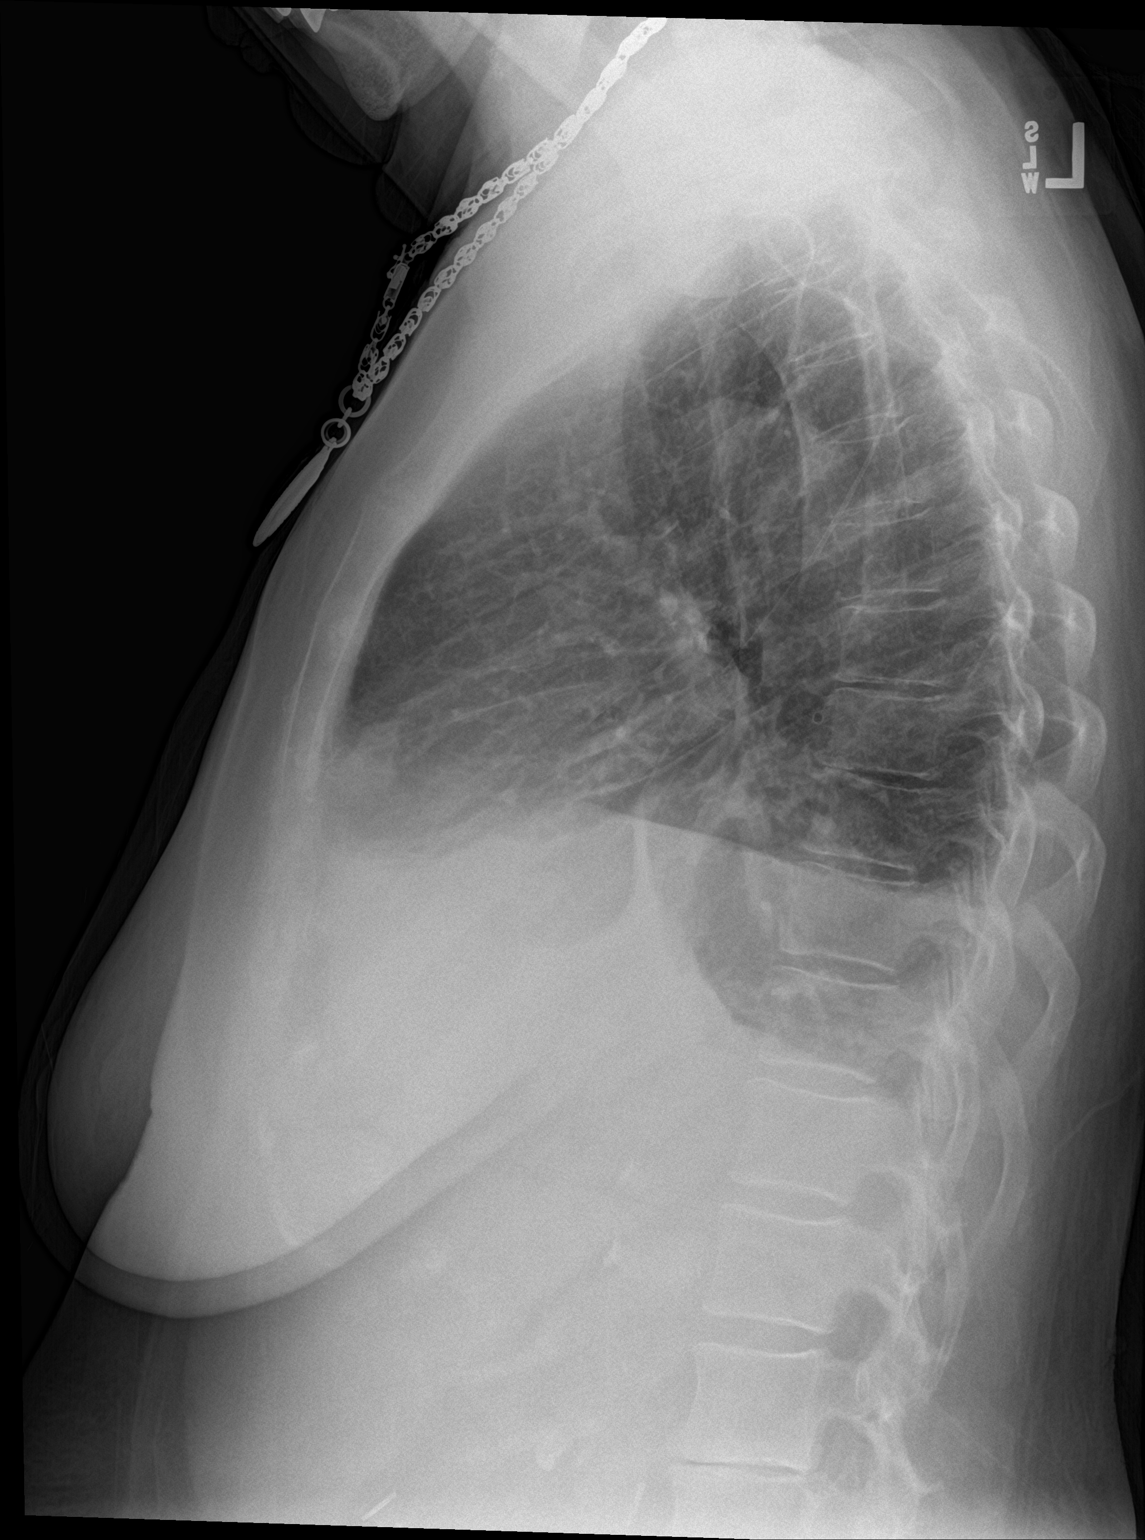

[2 of 2 positions shown; findings below may reference images not displayed]

FINDINGS: The lungs are adequately inflated. There is a small to moderate size
right pleural effusion and trace left pleural effusion. There is
bibasilar atelectasis or infiltrate. The heart is top-normal to
mildly enlarged. The pulmonary vascularity is less prominent than on
the previous study. The observed bony thorax exhibits no acute
abnormality.
IMPRESSION: Moderate sized right pleural effusion and small left pleural
effusion. Both are increased since the previous study. New bibasilar
atelectasis or pneumonia. Cardiomegaly without pulmonary vascular
congestion.

## 2016-11-28 ENCOUNTER — Telehealth: Payer: Self-pay | Admitting: Family Medicine

## 2016-11-28 NOTE — Telephone Encounter (Signed)
Pt would like a refill of the Biest Capsules because she states she is low. She stated she started taking one in the morning and one at night instead of just one a day. She states since taking the 2 a day she has seen a big difference in the hair loss. Thanks

## 2016-11-28 NOTE — Telephone Encounter (Signed)
I am only routing this to you because the patient is seeking a change in amount she takes instead of taking as prescribed. I thinks she needs an appointment though. Rachel DikeJennifer called 6/29 to have her schedule a f/u. She has appt scheduled 8/9

## 2016-11-30 NOTE — Telephone Encounter (Signed)
I would recommend she go back down to 1 tab.  She needs to keep the hormonesdose to lower as there are risk to going to high.

## 2016-12-01 NOTE — Telephone Encounter (Signed)
Left vm for pt to return call to clinic -EH/RMA  

## 2016-12-01 NOTE — Telephone Encounter (Signed)
Pt notified -EH/RMA  

## 2016-12-09 ENCOUNTER — Telehealth: Payer: Self-pay | Admitting: Cardiology

## 2016-12-12 NOTE — Progress Notes (Signed)
HPI: FU MR; seen previously for CHF and atrial fibrillation. Monitor 3/17 showed afib with rates 110-150 (performed at [facility]). 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 Chest xray 5/17 showed bilateral pleural effusions. She apparently had attempt at TEE guided cardio[place] in [place]. She apparently reverted immediately back to atrial fibrillation. I do not have those records available. Patient had [facility]ophageal [facility] May 2017 that showed normal LV systolic function, mild to moderate aortic insufficiency, moderate mitral regurgitation, moderate left atrial and right atrial enlargement and mild right ventricular enlargement. Moderate to severe tricuspid regurgitation. LV function improved with rate control of atrial fibrillation. Since last seen, the patient has dyspnea with more extreme activities but not with routine activities. It is relieved with rest. It is not associated with chest pain. There is no orthopnea, PND or pedal edema. There is no syncope or palpitations. There is no exertional chest pain.   Current Outpatient Prescriptions  Medication Sig Dispense Refill  . AMBULATORY NON FORMULARY MEDICATION Medication Name: T4 150 mcg SR cap FAX: [phone] 90 capsule 1  . AMBULATORY NON FORMULARY MEDICATION Medication Name: Biest capsule, 1.6mg  of estradiol and 3.2mg  of estriol capsule. Take one capsule daily. 90 capsule 3  . apixaban (ELIQUIS) 5 MG TABS tablet Take 1 tablet (5 mg total) by mouth 2 (two) times daily. NAME BRAND ONLY 180 tablet 3  . furosemide (LASIX) 40 MG tablet Take 1 tablet (40 mg total) by mouth daily. (Patient taking differently: Take 40 mg by mouth daily as needed. ) 30 tablet 0  . Omega-3 Fatty Acids (FISH OIL) 1000 MG CAPS Take by mouth.    . Potassium 99 MG TABS Take 1 tablet by mouth daily.    . traMADol  (ULTRAM) 50 MG tablet Take 1 tablet (50 mg total) by mouth every 8 (eight) hours as needed. 30 tablet 0  . traZODone (DESYREL) 50 MG tablet Take 0.5-2 tablets (25-100 mg total) by mouth at bedtime as needed for sleep. 60 tablet 1   No current facility-administered medications for this visit.      Past Medical History:  Diagnosis Date  . Anemia   . Anxiety   . Atrial fibrillation with RVR (HCC)   . Blood in the urine   . Chronic kidney disease (CKD), stage III (moderate)    [name]/notes 05/28/2015  . High cholesterol   . History of blood transfusion 08/2015   "fell and split my head open; got 2 units"  . Hypertension   . Hypothyroidism   . Nephrolithiasis     Past Surgical History:  Procedure Laterality Date  . ABDOMINAL HYSTERECTOMY    . ANKLE FRACTURE SURGERY Left ~ 2006  . APPENDECTOMY    . CARDIOVERSION  2017   [name]/notes 07/26/2015  . CATARACT EXTRACTION W/ INTRAOCULAR LENS  IMPLANT, BILATERAL Bilateral 09/06/2010  . CYSTOSCOPY W/ STONE MANIPULATION  06/2015 X 2  . FINGER SURGERY Right    "had bone growing out on the side of little finger"  . FRACTURE SURGERY    . LAPAROSCOPIC CHOLECYSTECTOMY    . TEE WITHOUT CARDIOVERSION N/A 10/04/2015   Procedure: TRANSESOPHAGEAL ECHOCARDIOGRAM (TEE);  Surgeon: Thurmon FairMihai Croitoru, MD;  [facility];  Service: Cardiovascular;  Laterality: N/A;  . TONSILLECTOMY      Social History   Social History  . Marital status: Divorced    Spouse name: N/A  .  Number of children: 4  . Years of education: N/A   Occupational History  . retired    Social History Main Topics  . Smoking status: Never Smoker  . Smokeless tobacco: Never Used  . Alcohol use 0.0 oz/week     Comment: 09/13/2015 "might have 1 drink/year"  . Drug use: No  . Sexual activity: No   Other Topics Concern  . Not on file   Social History Narrative   1 caffeinated drink daily     Family History  Problem Relation Age of Onset  . Alcohol abuse Mother   . Liver disease  Mother   . Alcohol abuse Father     ROS: no fevers or chills, productive cough, hemoptysis, dysphasia, odynophagia, melena, hematochezia, dysuria, hematuria, rash, seizure activity, orthopnea, PND, pedal edema, claudication. Remaining systems are negative.  Physical Exam: Well-developed well-nourished in no acute distress.  Skin is warm and dry.  HEENT is normal.  Neck is supple.  Chest is clear to auscultation with normal expansion.  Cardiovascular exam is irregular, 2/6 systolic murmur Abdominal exam nontender or distended. No masses palpated. Extremities show no edema. neuro grossly intact  ECG- Atrial fibrillation at a rate of 101. No ST changes. personally reviewed  A/P  1 Permanent atrial fibrillation-patient feels the Cardizem is making her fatigued. Her rate is mildly elevated. Increase atenolol to 75 mg twice a day and follow heart rate. Continue apixaban.  2 mitral regurgitation-previous transesophageal echocardiogram showed moderate mitral regurgitation, moderate to severe tricuspid regurgitation and mild to moderate aortic insufficiency. We will plan to repeat her echocardiogram. She may need valve surgery in the future.  3 chronic diastolic congestive heart failure-patient appears to be euvolemic. Continue present dose of Lasix.  4 hypertension-blood pressure is controlled. Continue present medications.  Olga MillersBrian Deandra Goering, MD

## 2016-12-18 ENCOUNTER — Ambulatory Visit (INDEPENDENT_AMBULATORY_CARE_PROVIDER_SITE_OTHER): Payer: Medicare HMO | Admitting: Family Medicine

## 2016-12-18 ENCOUNTER — Encounter: Payer: Self-pay | Admitting: Family Medicine

## 2016-12-18 VITALS — BP 119/69 | HR 74 | Resp 16 | Wt 138.0 lb

## 2016-12-18 DIAGNOSIS — Z79890 Hormone replacement therapy (postmenopausal): Secondary | ICD-10-CM | POA: Diagnosis not present

## 2016-12-18 DIAGNOSIS — I10 Essential (primary) hypertension: Secondary | ICD-10-CM | POA: Diagnosis not present

## 2016-12-18 DIAGNOSIS — I4891 Unspecified atrial fibrillation: Secondary | ICD-10-CM | POA: Diagnosis not present

## 2016-12-18 DIAGNOSIS — N39498 Other specified urinary incontinence: Secondary | ICD-10-CM

## 2016-12-18 DIAGNOSIS — E038 Other specified hypothyroidism: Secondary | ICD-10-CM

## 2016-12-18 LAB — POCT URINALYSIS DIPSTICK
Glucose, UA: NEGATIVE
NITRITE UA: NEGATIVE
PH UA: 6 (ref 5.0–8.0)
PROTEIN UA: 100
Spec Grav, UA: >=1.03 — AB (ref 1.010–1.025)
UROBILINOGEN UA: 0.2 U/dL

## 2016-12-18 MED ORDER — NITROFURANTOIN MONOHYD MACRO 100 MG PO CAPS: 100.0000 mg | ORAL_CAPSULE | Freq: Two times a day (BID) | ORAL | 0 refills | Status: AC

## 2016-12-18 NOTE — Progress Notes (Signed)
Subjective:    Patient ID: Rachel Keith, female    DOB: 01-18-1941, 76 y.o.   MRN: [id number]  HPI HRT -  As previously srggling with her loss and decided to increaseher hormone therapyAfe callgfowprscptioni enouae he o k wih jut oetab a day. We ha lso discussed possbly using Rogaine.  Hypertension- Pt denies chest pain, SOB, dizziness, or heart palpitations.  Taking meds as directed w/o problems.  Denies medication side effects.    Hypothyroidism- no recent skin changes.She has been losing a ittle more harthan usal.  Afib - No recent chest pain or shortness of breath. She does get a little short of breath when she walks her trash TO the curb and then on the way coming back. But she says overall she is noticed it's much less than it was previously. She is concerned about her Cardizem though. She has only been taking it once a day and says that when she wakes up first thing in the morning she feels dizzy. She thinks it's because of the Cardizem because she does take it at night. She still feels a little tired/sleepy in the morning. Feels like she could lay down and take a nap usually by the afternoon she feels much better. She is still taking her atenolol. She stopped taking her losartan because of hair loss.  She also reports that for about the past week to week and a half she's been having some incontinence episodes at night. She's never had this happen before. It's not happening during the daytime and she's not noticed any urgency frequency or blood in the urine. No fevers chills sweats or back pain with it. No worsening or alleviating factors. Always happens around 1 in the morning.  Review of Systems  BP 119/69   Pulse 74   Resp 16   Wt 138 lb (62.6 kg)   SpO2 100%   BMI 26.95 kg/m     Allergies  Allergen Reactions  . Amlodipine Other (See Comments)    Didn't feel well on 2.5 mg dose.  Didn't feel well on 2.5 mg dose.   Marland Kitchen Lisinopril Other (See Comments)    Hair Loss  . Sulfa  Antibiotics Rash    Broke out in her joints one time as a teenager  . Sulfacetamide Sodium Rash    Broke out in her joints one time as a teenager    Past Medical History:  Diagnosis Date  . Anemia   . Anxiety   . Atrial fibrillation with RVR (HCC)   . Blood in the urine   . Chronic kidney disease (CKD), stage III (moderate)    Rachel Keith 05/28/2015  . High cholesterol   . History of blood transfusion 08/2015   "fell and split my head open; got 2 units"  . Hypertension   . Hypothyroidism   . Nephrolithiasis     Past Surgical History:  Procedure Laterality Date  . ABDOMINAL HYSTERECTOMY    . ANKLE FRACTURE SURGERY Left ~ 2006  . APPENDECTOMY    . CARDIOVERSION  2017   Rachel Keith 07/26/2015  . CATARACT EXTRACTION W/ INTRAOCULAR LENS  IMPLANT, BILATERAL Bilateral 09/06/2010  . CYSTOSCOPY W/ STONE MANIPULATION  06/2015 X 2  . FINGER SURGERY Right    "had bone growing out on the side of little finger"  . FRACTURE SURGERY    . LAPAROSCOPIC CHOLECYSTECTOMY    . TEE WITHOUT CARDIOVERSION N/A 10/04/2015   Procedure: TRANSESOPHAGEAL ECHOCARDIOGRAM (TEE);  Surgeon: Thurmon Fair, MD;  Location:  [facility];  Service: Cardiovascular;  Laterality: N/A;  . TONSILLECTOMY      Social History   Social History  . Marital status: Divorced    Spouse name: N/A  . Number of children: 4  . Years of education: N/A   Occupational History  . retired    Social History Main Topics  . Smoking status: Never Smoker  . Smokeless tobacco: Never Used  . Alcohol use 0.0 oz/week     Comment: 09/13/2015 "might have 1 drink/year"  . Drug use: No  . Sexual activity: No   Other Topics Concern  . Not on file   Social History Narrative   1 caffeinated drink daily     Family History  Problem Relation Age of Onset  . Alcohol abuse Mother   . Liver disease Mother   . Alcohol abuse Father     Outpatient Encounter Prescriptions as of 12/18/2016  Medication Sig  . AMBULATORY NON FORMULARY MEDICATION  Medication Name: T4 150 mcg SR cap FAX: [phone]  . AMBULATORY NON FORMULARY MEDICATION Medication Name: Biest capsule, 1.6mg  of estradiol and 3.2mg  of estriol capsule. Take one capsule daily.  Marland Kitchen apixaban (ELIQUIS) 5 MG TABS tablet Take 1 tablet (5 mg total) by mouth 2 (two) times daily. NAME BRAND ONLY  . diltiazem (CARDIZEM SR) 60 MG 12 hr capsule TAKE ONE CAPSULE BY MOUTH EVERY 12 HOURS  . furosemide (LASIX) 40 MG tablet Take 1 tablet (40 mg total) by mouth daily. (Patient taking differently: Take 40 mg by mouth daily as needed. )  . Potassium 99 MG TABS Take 1 tablet by mouth daily.  . traMADol (ULTRAM) 50 MG tablet Take 1 tablet (50 mg total) by mouth every 8 (eight) hours as needed.  . traZODone (DESYREL) 50 MG tablet Take 0.5-2 tablets (25-100 mg total) by mouth at bedtime as needed for sleep.  . nitrofurantoin, macrocrystal-monohydrate, (MACROBID) 100 MG capsule Take 1 capsule (100 mg total) by mouth 2 (two) times daily.  . Omega-3 Fatty Acids (FISH OIL) 1000 MG CAPS Take by mouth.  . [DISCONTINUED] atenolol (TENORMIN) 50 MG tablet Take 50 mg by mouth 2 (two) times daily.  . [DISCONTINUED] Biotin 09811 MCG TBDP Take 1 capsule by mouth daily.  . [DISCONTINUED] diazepam (VALIUM) 10 MG tablet Take 1 tablet (10 mg total) by mouth daily as needed for anxiety. Patient needs to schedule a follow up appointment with PCP before more refills.  . [DISCONTINUED] losartan (COZAAR) 25 MG tablet Take 1 tablet (25 mg total) by mouth daily.  . [DISCONTINUED] Multiple Vitamin (MULTIVITAMIN WITH MINERALS) TABS tablet Take 1 tablet by mouth daily.  . [DISCONTINUED] Tretinoin 0.0375 % CREA Apply topically at bedtime.   No facility-administered encounter medications on file as of 12/18/2016.          Objective:   Physical Exam  Constitutional: She is oriented to person, place, and time. She appears well-developed and well-nourished.  HENT:  Head: Normocephalic and atraumatic.  Cardiovascular: Normal  rate and normal heart sounds.   + irregular rhythm  Pulmonary/Chest: Effort normal and breath sounds normal.  Neurological: She is alert and oriented to person, place, and time.  Skin: Skin is warm and dry.  Psychiatric: She has a normal mood and affect. Her behavior is normal.        Assessment & Plan:  HTN - Well controlled. In fact blood pressures a little borderline low today. She does have her follow-up cardiology next week and encouraged her to discuss  this with them. They may want to even consider decreasing her Cardizem dose since her pulse is well controlled even consider switching medications as it does sound like she is expressing some side effects in particular dizziness and fatigue.  Hypothyroidism  - well-controlled. Continue current regimen.  Afib - stable. Rate is well controlled. Keep follow-up with cardiology next week and discuss possibly adjusting and/or changing the Cardizem. Continue with atenolol as well.  HRT - does need refills sent on her medications. And will pick them up in the next week or 2.  Urinary incontinence at night - urinalysis did show possible urinary tract infections I will send over a prescription for treatment. Will treat with nitrofurantoin since she has a sulfa allergy.  Time spent 40 minutes, REM 50% time spent discussing blood pressure, hormone therapy, A. fib, thyroid, and urinary incontinence.

## 2016-12-19 MED ORDER — AMBULATORY NON FORMULARY MEDICATION: 3 refills | Status: DC

## 2016-12-19 NOTE — Addendum Note (Signed)
Addended by: Nani GasserMETHENEY, Contrell Ballentine D on: 12/19/2016 08:51 AM   Modules accepted: Orders

## 2016-12-20 LAB — URINE CULTURE

## 2016-12-24 ENCOUNTER — Encounter: Payer: Self-pay | Admitting: Cardiology

## 2016-12-24 ENCOUNTER — Ambulatory Visit (INDEPENDENT_AMBULATORY_CARE_PROVIDER_SITE_OTHER): Payer: Medicare HMO | Admitting: Cardiology

## 2016-12-24 ENCOUNTER — Telehealth: Payer: Self-pay | Admitting: Cardiology

## 2016-12-24 VITALS — BP 131/82 | HR 101 | Ht 60.0 in | Wt 138.1 lb

## 2016-12-24 DIAGNOSIS — I10 Essential (primary) hypertension: Secondary | ICD-10-CM

## 2016-12-24 DIAGNOSIS — I482 Chronic atrial fibrillation, unspecified: Secondary | ICD-10-CM

## 2016-12-24 DIAGNOSIS — I5032 Chronic diastolic (congestive) heart failure: Secondary | ICD-10-CM | POA: Diagnosis not present

## 2016-12-24 DIAGNOSIS — I059 Rheumatic mitral valve disease, unspecified: Secondary | ICD-10-CM

## 2016-12-24 MED ORDER — ATENOLOL 25 MG PO TABS: 25.0000 mg | ORAL_TABLET | Freq: Two times a day (BID) | ORAL | 3 refills | Status: DC

## 2016-12-24 MED ORDER — ATENOLOL 50 MG PO TABS: 50.0000 mg | ORAL_TABLET | Freq: Two times a day (BID) | ORAL | 3 refills | Status: DC

## 2016-12-24 NOTE — Patient Instructions (Signed)
Medication Instructions:   INCREASE ATENOLOL TO 75 MG TWICE DAILY= 1 OF THE 50 MG TABLETS AND 1 OF THE 25 MG TABLETS TWICE DAILY  Testing/Procedures:  Your physician has requested that you have an echocardiogram. Echocardiography is a painless test that uses sound waves to create images of your heart. It provides your doctor with information about the size and shape of your heart and how well your heart's chambers and valves are working. This procedure takes approximately one hour. There are no restrictions for this procedure.    Follow-Up:  Your physician wants you to follow-up in: 6 MONTHS WITH DR Jens SomRENSHAW You will receive a reminder letter in the mail two months in advance. If you don't receive a letter, please call our office to schedule the follow-up appointment.   If you need a refill on your cardiac medications before your next appointment, please call your pharmacy.

## 2016-12-24 NOTE — Telephone Encounter (Signed)
New Message     Pt has not taken lisinopril since July of last year due to hair loss

## 2016-12-24 NOTE — Telephone Encounter (Signed)
Pt was just in to see Dr Jens Somrenshaw today.

## 2016-12-24 NOTE — Telephone Encounter (Signed)
Pt states that Dr Jens Somrenshaw asked her if she was taking Lisinopril and she did not remember but she went home and "looked at her notes" and not taken lisinopril since July of last year due to hair loss .

## 2016-12-26 ENCOUNTER — Other Ambulatory Visit: Payer: Self-pay | Admitting: Family Medicine

## 2017-01-01 ENCOUNTER — Ambulatory Visit (HOSPITAL_BASED_OUTPATIENT_CLINIC_OR_DEPARTMENT_OTHER)
Admission: RE | Admit: 2017-01-01 | Discharge: 2017-01-01 | Disposition: A | Discharge status: Home / Self Care | Payer: Medicare HMO | Source: Ambulatory Visit | Attending: Cardiology | Admitting: Cardiology

## 2017-01-01 DIAGNOSIS — I083 Combined rheumatic disorders of mitral, aortic and tricuspid valves: Secondary | ICD-10-CM | POA: Insufficient documentation

## 2017-01-01 DIAGNOSIS — I361 Nonrheumatic tricuspid (valve) insufficiency: Secondary | ICD-10-CM | POA: Diagnosis not present

## 2017-01-01 DIAGNOSIS — I10 Essential (primary) hypertension: Secondary | ICD-10-CM | POA: Diagnosis not present

## 2017-01-01 DIAGNOSIS — I059 Rheumatic mitral valve disease, unspecified: Secondary | ICD-10-CM

## 2017-01-01 DIAGNOSIS — I4891 Unspecified atrial fibrillation: Secondary | ICD-10-CM | POA: Diagnosis not present

## 2017-01-01 DIAGNOSIS — E785 Hyperlipidemia, unspecified: Secondary | ICD-10-CM | POA: Insufficient documentation

## 2017-01-01 NOTE — Progress Notes (Signed)
  Echocardiogram 2D Echocardiogram has been performed.  Rachel Keith 01/01/2017, 12:23 PM

## 2017-01-02 ENCOUNTER — Telehealth: Payer: Self-pay | Admitting: Cardiology

## 2017-01-02 NOTE — Telephone Encounter (Signed)
New message    Pt is calling asking if her echo results will be sent to her PCP?

## 2017-01-02 NOTE — Telephone Encounter (Signed)
Pt notified-echo to PCP

## 2017-01-02 NOTE — Telephone Encounter (Signed)
Forwarded ECHO via [facility] to PCP as requested

## 2017-01-05 NOTE — Telephone Encounter (Signed)
Close encounter 

## 2017-02-09 ENCOUNTER — Other Ambulatory Visit: Payer: Self-pay

## 2017-02-09 MED ORDER — FUROSEMIDE 40 MG PO TABS: 40.0000 mg | ORAL_TABLET | Freq: Every day | ORAL | 0 refills | Status: DC

## 2017-03-09 ENCOUNTER — Other Ambulatory Visit: Payer: Self-pay | Admitting: Cardiology

## 2017-03-24 ENCOUNTER — Other Ambulatory Visit: Payer: Self-pay | Admitting: Osteopathic Medicine

## 2017-03-25 ENCOUNTER — Telehealth: Payer: Self-pay | Admitting: Family Medicine

## 2017-03-25 DIAGNOSIS — E785 Hyperlipidemia, unspecified: Secondary | ICD-10-CM

## 2017-03-25 DIAGNOSIS — I10 Essential (primary) hypertension: Secondary | ICD-10-CM

## 2017-03-25 DIAGNOSIS — Z0000 Encounter for general adult medical examination without abnormal findings: Secondary | ICD-10-CM

## 2017-03-25 DIAGNOSIS — E038 Other specified hypothyroidism: Secondary | ICD-10-CM

## 2017-03-25 NOTE — Telephone Encounter (Signed)
Pt has to move appt to January due to getting new insurance.

## 2017-03-25 NOTE — Telephone Encounter (Signed)
Patient rescheduled for the middle of January. She would like labs drawn before her appointment so they can be discussed at her appointment on 06/02/17. Thanks!

## 2017-03-26 NOTE — Addendum Note (Signed)
Addended by: Collie SiadICHARDSON, Preslynn Bier M on: 03/26/2017 03:28 PM   Modules accepted: Orders

## 2017-03-26 NOTE — Telephone Encounter (Signed)
Lab orders placed.  

## 2017-03-26 NOTE — Telephone Encounter (Signed)
OK for CMP, lipid, TSH 

## 2017-04-16 ENCOUNTER — Ambulatory Visit: Payer: Medicare HMO | Admitting: Family Medicine

## 2017-06-02 ENCOUNTER — Ambulatory Visit: Payer: Medicare HMO | Admitting: Family Medicine

## 2017-06-03 ENCOUNTER — Other Ambulatory Visit: Payer: Self-pay | Admitting: Family Medicine

## 2017-06-18 ENCOUNTER — Ambulatory Visit: Payer: Medicare HMO | Admitting: Family Medicine

## 2017-06-24 ENCOUNTER — Ambulatory Visit (INDEPENDENT_AMBULATORY_CARE_PROVIDER_SITE_OTHER): Payer: Medicare HMO | Admitting: Family Medicine

## 2017-06-24 ENCOUNTER — Encounter: Payer: Self-pay | Admitting: Family Medicine

## 2017-06-24 VITALS — BP 120/61 | HR 98 | Ht 60.0 in | Wt 131.0 lb

## 2017-06-24 DIAGNOSIS — N183 Chronic kidney disease, stage 3 unspecified: Secondary | ICD-10-CM

## 2017-06-24 DIAGNOSIS — I482 Chronic atrial fibrillation, unspecified: Secondary | ICD-10-CM

## 2017-06-24 DIAGNOSIS — H269 Unspecified cataract: Secondary | ICD-10-CM | POA: Insufficient documentation

## 2017-06-24 DIAGNOSIS — E042 Nontoxic multinodular goiter: Secondary | ICD-10-CM | POA: Insufficient documentation

## 2017-06-24 DIAGNOSIS — E038 Other specified hypothyroidism: Secondary | ICD-10-CM | POA: Diagnosis not present

## 2017-06-24 DIAGNOSIS — I10 Essential (primary) hypertension: Secondary | ICD-10-CM

## 2017-06-24 NOTE — Progress Notes (Signed)
Subjective:    CC: BP  HPI: Hypertension- Pt denies chest pain, SOB, dizziness, or heart palpitations.  Taking meds as directed w/o problems.  Denies medication side effects.    Hypothyroidism- doing well overall. No recent changes in skin, hair or weight. Though she has ben cutting back on portion and has lost some weight.   In August after I last saw her.  They did increase her atenolol to 75 mg twice a day.  They also requested that she be scheduled for an echocardiogram to further evaluate her mitral valve.  Echocardiogram showed ejection fraction of 5055% with some mild aortic insufficiency and moderate mitral regurg.  The left atrium was severely dilated.  She says really she is been doing very well since she last saw me and feeling well.  She would really like to try to decrease her Lasix.  She says some days when she forgets to take it she really does not notice a big difference.  She is really only taking half a tab daily.  She also takes a potassium supplement.  Past medical history, Surgical history, Family history not pertinant except as noted below, Social history, Allergies, and medications have been entered into the medical record, reviewed, and corrections made.   Review of Systems: No fevers, chills, night sweats, weight loss, chest pain, or shortness of breath.   Objective:    General: Well Developed, well nourished, and in no acute distress.  Neuro: Alert and oriented x3, extra-ocular muscles intact, sensation grossly intact.  HEENT: Normocephalic, atraumatic  Skin: Warm and dry, no rashes. Cardiac: Regular rate and rhythm, no murmurs rubs or gallops, no lower extremity edema.  Respiratory: Clear to auscultation bilaterally. Not using accessory muscles, speaking in full sentences.   Impression and Recommendations:    HTN - Well controlled. Continue current regimen. Follow up in  4 months.  Due for CMP and lipid panel.  Hypothyroidism-she feels like her thyroid is  well regulated.  Due to recheck TSH.  Permanent atrial fibrillation-stable.  Rate controlled.  On Eliquis and doing well.  She has been able to afford the medication.  We discussed that we can certainly try decreasing the Lasix.  She can go down to half a tab every other day and we can check her lab work in 2 weeks.  She just needs to follow her weight carefully look for swelling and monitor for any shortness of breath.  She does have a home blood pressure cuff and says that she can do that.

## 2017-06-24 NOTE — Patient Instructions (Signed)
Decrease lasix to 1/2 tab every other day for 2 weeks and then go to the lab.  Don't take your potassium on days that you don't take your lasix.

## 2017-07-16 DIAGNOSIS — E038 Other specified hypothyroidism: Secondary | ICD-10-CM | POA: Diagnosis not present

## 2017-07-16 DIAGNOSIS — I10 Essential (primary) hypertension: Secondary | ICD-10-CM | POA: Diagnosis not present

## 2017-07-16 LAB — COMPLETE METABOLIC PANEL WITH GFR
AG RATIO: 1.3 (calc) (ref 1.0–2.5)
ALKALINE PHOSPHATASE (APISO): 88 U/L (ref 33–130)
ALT: 14 U/L (ref 6–29)
AST: 24 U/L (ref 10–35)
Albumin: 4.1 g/dL (ref 3.6–5.1)
BILIRUBIN TOTAL: 0.8 mg/dL (ref 0.2–1.2)
BUN/Creatinine Ratio: 10 (calc) (ref 6–22)
BUN: 11 mg/dL (ref 7–25)
CALCIUM: 10.4 mg/dL (ref 8.6–10.4)
CO2: 29 mmol/L (ref 20–32)
Chloride: 102 mmol/L (ref 98–110)
Creat: 1.05 mg/dL — ABNORMAL HIGH (ref 0.60–0.93)
GFR, EST NON AFRICAN AMERICAN: 51 mL/min/{1.73_m2} — AB (ref >=60)
GFR, Est African American: 59 mL/min/{1.73_m2} — ABNORMAL LOW (ref >=60)
GLOBULIN: 3.1 g/dL (ref 1.9–3.7)
Glucose, Bld: 101 mg/dL — ABNORMAL HIGH (ref 65–99)
POTASSIUM: 4 mmol/L (ref 3.5–5.3)
SODIUM: 142 mmol/L (ref 135–146)
Total Protein: 7.2 g/dL (ref 6.1–8.1)

## 2017-07-16 LAB — LIPID PANEL
Cholesterol: 142 mg/dL (ref <200)
HDL: 32 mg/dL — AB (ref >50)
LDL Cholesterol (Calc): 77 mg/dL (calc)
NON-HDL CHOLESTEROL (CALC): 110 mg/dL (ref <130)
Total CHOL/HDL Ratio: 4.4 (calc) (ref <5.0)
Triglycerides: 252 mg/dL — ABNORMAL HIGH (ref <150)

## 2017-07-16 LAB — TSH: TSH: 0.02 mIU/L — ABNORMAL LOW (ref 0.40–4.50)

## 2017-07-20 ENCOUNTER — Other Ambulatory Visit: Payer: Self-pay | Admitting: *Deleted

## 2017-07-20 DIAGNOSIS — E038 Other specified hypothyroidism: Secondary | ICD-10-CM

## 2017-08-11 ENCOUNTER — Ambulatory Visit (INDEPENDENT_AMBULATORY_CARE_PROVIDER_SITE_OTHER): Payer: Medicare HMO | Admitting: Family Medicine

## 2017-08-11 ENCOUNTER — Encounter: Payer: Self-pay | Admitting: Family Medicine

## 2017-08-11 VITALS — BP 126/72 | HR 97 | Ht 60.0 in | Wt 129.0 lb

## 2017-08-11 DIAGNOSIS — R7989 Other specified abnormal findings of blood chemistry: Secondary | ICD-10-CM | POA: Diagnosis not present

## 2017-08-11 DIAGNOSIS — L659 Nonscarring hair loss, unspecified: Secondary | ICD-10-CM | POA: Diagnosis not present

## 2017-08-11 DIAGNOSIS — E038 Other specified hypothyroidism: Secondary | ICD-10-CM

## 2017-08-11 NOTE — Progress Notes (Signed)
   Subjective:    Patient ID: Rachel Keith, female    DOB: May 14, 1940, 77 y.o.   MRN: [id number]  HPI 77 year old female comes in today complaining of increased hair loss.  She thinks it could be because her thyroid is off.  We last checked her TSH on March 7 and her TSH was 0.02.  She was supratherapeutic so we adjusted her dose down.  I had her decrease to half a tab 1 day a week and a whole tab the other 6 days a week.  She is due to recheck her level.  Hair loss - she is very concerned about her hair loss. She is getting bald spot and not getting any new hair growth.  This has been stressful. She feels like it is coming from her thyroid being off.      Review of Systems     Objective:   Physical Exam  Constitutional: She is oriented to person, place, and time. She appears well-developed and well-nourished.  HENT:  Head: Normocephalic and atraumatic.  Diffuse thinning of hair.  No circular areas of hair loss seen.  Some areas posteriorly are more thin. Scar over the left posterior scalp.   Eyes: Conjunctivae and EOM are normal.  Cardiovascular: Normal rate.  Pulmonary/Chest: Effort normal.  Neurological: She is alert and oriented to person, place, and time.  Skin: Skin is dry. No pallor.  Psychiatric: She has a normal mood and affect. Her behavior is normal.  Vitals reviewed.         Assessment & Plan:  Hypothyroidism - will recheck TSH and adjust dose as needed.  She would also like to try switching to the brand thyroidand see if that helps with the hair loss.    Hair loss - appears diffuse on exam. I don't see any sign of alopecia areata.  Discussed may be bc thyroid is off right now. Not sure why the sudden change. Could also consider minoxidil.

## 2017-08-12 ENCOUNTER — Other Ambulatory Visit: Payer: Self-pay | Admitting: Cardiology

## 2017-08-14 ENCOUNTER — Other Ambulatory Visit: Payer: Self-pay | Admitting: *Deleted

## 2017-08-14 DIAGNOSIS — R7989 Other specified abnormal findings of blood chemistry: Secondary | ICD-10-CM

## 2017-08-14 DIAGNOSIS — E032 Hypothyroidism due to medicaments and other exogenous substances: Secondary | ICD-10-CM

## 2017-08-14 DIAGNOSIS — E038 Other specified hypothyroidism: Secondary | ICD-10-CM

## 2017-08-14 LAB — THYROID PEROXIDASE ANTIBODY: THYROID PEROXIDASE ANTIBODY: 4 [IU]/mL (ref <9)

## 2017-08-14 LAB — TSH: TSH: 0.01 mIU/L — ABNORMAL LOW (ref 0.40–4.50)

## 2017-08-14 LAB — T4, FREE: FREE T4: 1.7 ng/dL (ref 0.8–1.8)

## 2017-08-18 ENCOUNTER — Telehealth: Payer: Self-pay

## 2017-09-11 ENCOUNTER — Ambulatory Visit: Payer: Medicare HMO | Admitting: Endocrinology

## 2017-09-11 ENCOUNTER — Other Ambulatory Visit: Payer: Self-pay | Admitting: Family Medicine

## 2017-09-11 DIAGNOSIS — Z0289 Encounter for other administrative examinations: Secondary | ICD-10-CM

## 2017-09-11 DIAGNOSIS — E039 Hypothyroidism, unspecified: Secondary | ICD-10-CM | POA: Diagnosis not present

## 2017-09-14 ENCOUNTER — Telehealth: Payer: Self-pay

## 2017-09-14 MED ORDER — "SYRINGE 25G X 1"" 3 ML MISC": 99 refills | Status: DC

## 2017-09-14 MED ORDER — ESTRADIOL CYPIONATE 5 MG/ML IM OIL: 2.0000 mg | TOPICAL_OIL | INTRAMUSCULAR | 0 refills | Status: DC

## 2017-09-14 NOTE — Telephone Encounter (Signed)
Call pt: We can put her on Depo estradiol 2 mg every 3-4 weeks. I can send to her pharmacy if she is ok with that. .  I know she understands the risk of estrogen for stroke and breast cancer. She has to go for her mammogram this mont.

## 2017-09-14 NOTE — Telephone Encounter (Signed)
Pt stopped me and wanted to let me know that she would like Dr. Linford Arnold to write for her to get the 40 mg  Injections for the Estradiol because her hair is still continuing to fall out and this is what she feels will help stop this and has helped her in the past. She stated that if she has to sign any documentation waiving Dr. Shelah Lewandowsky liability she will. She has never had Cancer on either side of her family. Will fwd to pcp per pt's request .Heath Gold, CMA

## 2017-09-14 NOTE — Telephone Encounter (Signed)
Medication sent..Concetta Guion [name], CMA  

## 2017-09-14 NOTE — Telephone Encounter (Signed)
Pt advised. She would like Rx be sent to [facility] pharmacy.

## 2017-09-14 NOTE — Telephone Encounter (Signed)
Rachel Keith wants to go back on Tretinoin cream and estradiol 1.6 mg and estriol 3.2 mg SR capsule. She really would like Dr Linford Arnold to prescribe estrogen 40 mg injections. She state she will sign a waiver.

## 2017-09-16 NOTE — Telephone Encounter (Signed)
I just faxed over new script.

## 2017-09-16 NOTE — Telephone Encounter (Signed)
Rachel Keith called and states the injection of estradiol cost too much. She would like to have a prescription for estradiol and estrace tablets. Please advise.

## 2017-09-16 NOTE — Telephone Encounter (Signed)
Left VM with update.  

## 2017-09-16 NOTE — Telephone Encounter (Signed)
Patient advised.

## 2017-09-18 DIAGNOSIS — E038 Other specified hypothyroidism: Secondary | ICD-10-CM | POA: Diagnosis not present

## 2017-09-18 LAB — TSH: TSH: 2.25 mIU/L (ref 0.40–4.50)

## 2017-09-25 ENCOUNTER — Telehealth: Payer: Self-pay

## 2017-09-25 NOTE — Telephone Encounter (Signed)
No additional encounter note  

## 2017-09-27 NOTE — Telephone Encounter (Signed)
She can get OTC Minoxodil for women. Follow instrucitons on package.

## 2017-09-28 NOTE — Telephone Encounter (Signed)
Spoke with Rachel Keith. She was able to get the Keratin for her hair from a vitamin shop. She is taking 4 tabs daily. She has already started to notice a difference. Rachel Keith states once she stopped taking her Thyroid medication she has felt like she's "come out of a fog." Her hair loss has also decreased. She reports she "feels good - for the first time in a long time."   No further questions.

## 2017-09-28 NOTE — Telephone Encounter (Signed)
I've aleady recommended several times that she not stop her thyroid medication and that we could switch her hands if she would prefer.

## 2017-09-29 NOTE — Telephone Encounter (Signed)
Spoke with Pt, she was advised to stop her TSH Rx for 48 hrs prior to getting most recent labs done. This was advised to her by a PA at her endocrinologist [facility]). She did so and her number were "better than ever." Pt states she feels so good not taking the TSH Rx that she doesn't want to start back. She has not yet heard from [facility] Endo about next steps.

## 2017-09-30 ENCOUNTER — Other Ambulatory Visit: Payer: Self-pay | Admitting: Osteopathic Medicine

## 2017-09-30 ENCOUNTER — Other Ambulatory Visit: Payer: Self-pay | Admitting: Family Medicine

## 2017-09-30 NOTE — Telephone Encounter (Signed)
There is a note in chart routed to you stating pt did not want to take this med anymore. Please advise

## 2017-10-02 ENCOUNTER — Telehealth: Payer: Self-pay

## 2017-10-02 NOTE — Telephone Encounter (Signed)
Pt called to give Dr. Jacky Keith that she is not going to see endocrinologist because she was not happy with her PA. Pt states she cancelled her upcoming appt that was in August. She refuses to return.   I advised patient SEVERAL times during the 15 minute phone call that she needs to continue to take her thyroid medications due to Dr Rachel Keith previous notations in her labs/notes, and patient still refuses. Pt reports that the medication makes her hair fall out and the only way she will take anything for her thyroid is if Dr Rachel Keith gives her something synthetic and can give her something to make her hair grow back.   I advised pt I would let Dr Rachel Keith know, but she needed to continue to take her medication until we got back in touch with her, and she refused.   Please advise.Marland Kitchen

## 2017-10-02 NOTE — Telephone Encounter (Signed)
Pt scheduled for 8 week follow up, 40 minute appt.

## 2017-10-02 NOTE — Telephone Encounter (Signed)
OK. Just have her f/u with me in 8 weeks and we will check her thyroid off of medication.

## 2017-11-02 ENCOUNTER — Other Ambulatory Visit: Payer: Self-pay | Admitting: Family Medicine

## 2017-11-25 ENCOUNTER — Ambulatory Visit (INDEPENDENT_AMBULATORY_CARE_PROVIDER_SITE_OTHER): Payer: Medicare HMO

## 2017-11-25 ENCOUNTER — Ambulatory Visit (INDEPENDENT_AMBULATORY_CARE_PROVIDER_SITE_OTHER): Payer: Medicare HMO | Admitting: Family Medicine

## 2017-11-25 ENCOUNTER — Encounter: Payer: Self-pay | Admitting: Family Medicine

## 2017-11-25 ENCOUNTER — Telehealth: Payer: Self-pay | Admitting: Family Medicine

## 2017-11-25 VITALS — BP 121/72 | HR 87 | Ht 60.0 in | Wt 133.0 lb

## 2017-11-25 DIAGNOSIS — L659 Nonscarring hair loss, unspecified: Secondary | ICD-10-CM

## 2017-11-25 DIAGNOSIS — Z1231 Encounter for screening mammogram for malignant neoplasm of breast: Secondary | ICD-10-CM

## 2017-11-25 DIAGNOSIS — Z79890 Hormone replacement therapy (postmenopausal): Secondary | ICD-10-CM | POA: Diagnosis not present

## 2017-11-25 DIAGNOSIS — E038 Other specified hypothyroidism: Secondary | ICD-10-CM

## 2017-11-25 DIAGNOSIS — I4891 Unspecified atrial fibrillation: Secondary | ICD-10-CM | POA: Diagnosis not present

## 2017-11-25 MED ORDER — TRETINOIN 0.025 % EX CREA: TOPICAL_CREAM | Freq: Every evening | CUTANEOUS | 1 refills | Status: AC | PRN

## 2017-11-25 NOTE — Telephone Encounter (Signed)
Called pharmacy and spoke with Riki RuskJeremy and he stated that it was approved on his end and the patient has picked the medication. Patient is aware its approved. Form sent to scan.

## 2017-11-25 NOTE — Telephone Encounter (Signed)
Pt called clinic to advise the Rx sent today for Tretinoin cream requires PA. Routing for review.

## 2017-11-25 NOTE — Progress Notes (Signed)
Subjective:    Patient ID: Rachel Keith, female    DOB: 07-13-1940, 77 y.o.   MRN: [id number]  HPI  77 year old female is here today to follow-up for hypothyroidism and hair loss.  Recently she had consulted with an endocrinologist because she felt like her thyroid hormone replacement therapy was causing significant hair loss which she was very concerned about.  We had previously discussed treatments for hair loss which she declined.  She does also take hormone replacement therapy and that has not been adjusted recently.  She called and said she was going to stop her thyroid medication after consultation with endocrine and said she would not return to their office.  I asked her to return in about 8 weeks so that we could recheck her blood work off of thyroid medication.  She actually feels much better off the thyroid medication in fact about 3 to 4 days afterwards she said she started to feel much more mentally clear and says feels like even her vision got better.  She feels like her hair is growing back again and at least is "falling out".   Says her oldest son with Bipolar has been missing since Friday.  She is been worried and upset about him.  They have reported him as a missing persons to the police.  She is also been extra stressed because she needs new breaks on her car and she cannot afford to get them repaired and her air conditioning went out upstairs and she is had to have that repaired as well.  Her fibrillation-she wanted to let me know that she had actually decreased her atenolol on her own down to 25 mg from 50 mg.  He says she was just feeling extremely tired and sedated on it.  Is by 10:30 in the morning she had to lay down to take a nap.  Since coming down on her dose she feels much better and has much more energy.  She feels like her pulse is still well controlled.  Also like a new prescription for tretinoin.  I had prescribed it about 6 years ago and she would like a new one for  tretinoin 0.025% cream for topical use.  Review of Systems  BP 121/72   Pulse 87   Ht 5' (1.524 m)   Wt 133 lb (60.3 kg)   SpO2 100%   BMI 25.97 kg/m     Allergies  Allergen Reactions  . Amlodipine Other (See Comments)    Didn't feel well on 2.5 mg dose.  Didn't feel well on 2.5 mg dose.   Marland Kitchen Lisinopril Other (See Comments)    Hair Loss  . Sulfa Antibiotics Rash    Broke out in her joints one time as a teenager  . Sulfacetamide Sodium Rash    Broke out in her joints one time as a teenager    Past Medical History:  Diagnosis Date  . Anemia   . Anxiety   . Atrial fibrillation with RVR (HCC)   . Blood in the urine   . Chronic kidney disease (CKD), stage III (moderate) (HCC)    [name] 05/28/2015  . High cholesterol   . History of blood transfusion 08/2015   "fell and split my head open; got 2 units"  . Hypertension   . Hypothyroidism   . Nephrolithiasis     Past Surgical History:  Procedure Laterality Date  . ABDOMINAL HYSTERECTOMY    . ANKLE FRACTURE SURGERY Left ~ 2006  . APPENDECTOMY    .  CARDIOVERSION  2017   [name]/notes 07/26/2015  . CATARACT EXTRACTION W/ INTRAOCULAR LENS  IMPLANT, BILATERAL Bilateral 09/06/2010  . CYSTOSCOPY W/ STONE MANIPULATION  06/2015 X 2  . FINGER SURGERY Right    "had bone growing out on the side of little finger"  . FRACTURE SURGERY    . LAPAROSCOPIC CHOLECYSTECTOMY    . TEE WITHOUT CARDIOVERSION N/A 10/04/2015   Procedure: TRANSESOPHAGEAL ECHOCARDIOGRAM (TEE);  Surgeon: Thurmon FairMihai Croitoru, MD;  [facility];  Service: Cardiovascular;  Laterality: N/A;  . TONSILLECTOMY      Social History   Socioeconomic History  . Marital status: Divorced    Spouse name: Not on file  . Number of children: 4  . Years of education: Not on file  . Highest education level: Not on file  Occupational History  . Occupation: retired  Engineer, productionocial Needs  . Financial resource strain: Not on file  . Food insecurity:    Worry: Not on file    Inability: Not  on file  . Transportation needs:    Medical: Not on file    Non-medical: Not on file  Tobacco Use  . Smoking status: Never Smoker  . Smokeless tobacco: Never Used  Substance and Sexual Activity  . Alcohol use: Yes    Alcohol/week: 0.0 oz    Comment: 09/13/2015 "might have 1 drink/year"  . Drug use: No  . Sexual activity: Never  Lifestyle  . Physical activity:    Days per week: Not on file    Minutes per session: Not on file  . Stress: Not on file  Relationships  . Social connections:    Talks on phone: Not on file    Gets together: Not on file    Attends religious service: Not on file    Active member of club or organization: Not on file    Attends meetings of clubs or organizations: Not on file    Relationship status: Not on file  . Intimate partner violence:    Fear of current or ex partner: Not on file    Emotionally abused: Not on file    Physically abused: Not on file    Forced sexual activity: Not on file  Other Topics Concern  . Not on file  Social History Narrative   1 caffeinated drink daily     Family History  Problem Relation Age of Onset  . Alcohol abuse Mother   . Liver disease Mother   . Alcohol abuse Father     Outpatient Encounter Medications as of 11/25/2017  Medication Sig  . atenolol (TENORMIN) 50 MG tablet Take 50 mg by mouth daily.  . B Complex Vitamins (B COMPLEX PO) Take 1 tablet by mouth daily.  . Biotin 5 MG TABS Take 1 tablet by mouth daily.  . diazepam (VALIUM) 10 MG tablet TAKE 1 TABLET BY MOUTH DAILY AS NEEDED FOR ANXIETY  . ELIQUIS 5 MG TABS tablet TAKE 1 TABLET (5 MG TOTAL) BY MOUTH 2 (TWO) TIMES DAILY. NAME BRAND ONLY  . estradiol cypionate (DEPO-ESTRADIOL) 5 MG/ML injection Inject 0.4 mLs (2 mg total) into the muscle every 21 ( twenty-one) days.  . furosemide (LASIX) 40 MG tablet TAKE 1 TABLET BY MOUTH EVERY DAY  . losartan (COZAAR) 25 MG tablet TAKE 1 TABLET (25 MG TOTAL) BY MOUTH DAILY.  Marland Kitchen. Omega-3 Fatty Acids (FISH OIL) 1000 MG  CAPS Take 3 capsules by mouth daily.   . Potassium 99 MG TABS Take 1 tablet by mouth daily.  .Marland Kitchen  Selenium 200 MCG TABS Take 1 tablet by mouth daily.  . Syringe/Needle, Disp, (SYRINGE 3CC/25GX1") 25G X 1" 3 ML MISC For use in drawing up/administring depo-estradiol  . TURMERIC CURCUMIN PO Take 1 tablet by mouth daily.  Marland Kitchen tretinoin (RETIN-A) 0.025 % cream Apply topically at bedtime as needed.  . [DISCONTINUED] atenolol (TENORMIN) 25 MG tablet Take 25 mg by mouth 2 (two) times daily.  . [DISCONTINUED] atenolol (TENORMIN) 50 MG tablet Take 50 mg by mouth 2 (two) times daily.  . [DISCONTINUED] levothyroxine (SYNTHROID, LEVOTHROID) 150 MCG tablet TAKE 1 TABLET BY MOUTH ONCE A DAY   No facility-administered encounter medications on file as of 11/25/2017.          Objective:   Physical Exam  Constitutional: She is oriented to person, place, and time. She appears well-developed and well-nourished.  HENT:  Head: Normocephalic and atraumatic.  Neck: No thyromegaly present.  Cardiovascular: Normal rate, regular rhythm and normal heart sounds.  Pulmonary/Chest: Effort normal and breath sounds normal.  Neurological: She is alert and oriented to person, place, and time.  Skin: Skin is warm and dry.  Psychiatric: She has a normal mood and affect. Her behavior is normal.       Assessment & Plan:  Hypothyroidism-we will plan to recheck TSH today, since she has been off medication for almost 8 weeks at this point.  If the TSH is elevated then see if she would consider taking something like Nature-Throid since she feels like the levothyroxine was causing significant symptoms.  HRT -he is overdue for yearly mammogram since she is on chronic estrogen therapy.  Multiple discussions in the past about coming off of her hormones but she feels very adamant about continuing them and says she does understand the full risks of continuing them including increased risk for breast cancer, stroke and blood clots.  Hair  loss-again discussed can consider topical minoxidil. She feels like it has much been much better since coming off her thyroid medication.  Acute stress with her son being missing.  Atrial fibrillation-seems to be rate controlled on the lower dose of atenolol.  Did encourage her to schedule follow-up with Dr. Jens Som soon.

## 2017-11-26 LAB — T4, FREE: Free T4: 0.5 ng/dL — ABNORMAL LOW (ref 0.8–1.8)

## 2017-11-26 LAB — TSH: TSH: 42.22 mIU/L — ABNORMAL HIGH (ref 0.40–4.50)

## 2017-12-10 ENCOUNTER — Other Ambulatory Visit: Payer: Self-pay | Admitting: Family Medicine

## 2017-12-10 ENCOUNTER — Other Ambulatory Visit: Payer: Self-pay

## 2017-12-10 MED ORDER — DIAZEPAM 10 MG PO TABS: 10.0000 mg | ORAL_TABLET | Freq: Every day | ORAL | 0 refills | Status: DC | PRN

## 2017-12-10 NOTE — Telephone Encounter (Signed)
Pt called requesting RF on her Diazepam. States her son has been missing and she was recently told he is in a hospital with major brain swelling and is likely not going to make it. Pt very tearful and upset, requesting RF.   Diazepam 10mg  tabs, 1 QD  Last written 11-02-17 for #30  RX pended, please send if appropriate.  Thanks!

## 2017-12-10 NOTE — Telephone Encounter (Signed)
Left pt msg advising RX sent in  

## 2018-02-09 ENCOUNTER — Ambulatory Visit (INDEPENDENT_AMBULATORY_CARE_PROVIDER_SITE_OTHER): Payer: Medicare HMO | Admitting: Family Medicine

## 2018-02-09 ENCOUNTER — Encounter: Payer: Self-pay | Admitting: Family Medicine

## 2018-02-09 VITALS — BP 138/83 | HR 83 | Ht 60.0 in | Wt 137.0 lb

## 2018-02-09 DIAGNOSIS — E038 Other specified hypothyroidism: Secondary | ICD-10-CM

## 2018-02-09 DIAGNOSIS — I4891 Unspecified atrial fibrillation: Secondary | ICD-10-CM

## 2018-02-09 DIAGNOSIS — N183 Chronic kidney disease, stage 3 unspecified: Secondary | ICD-10-CM

## 2018-02-09 DIAGNOSIS — I340 Nonrheumatic mitral (valve) insufficiency: Secondary | ICD-10-CM | POA: Diagnosis not present

## 2018-02-09 DIAGNOSIS — R0609 Other forms of dyspnea: Secondary | ICD-10-CM | POA: Diagnosis not present

## 2018-02-09 DIAGNOSIS — M791 Myalgia, unspecified site: Secondary | ICD-10-CM | POA: Diagnosis not present

## 2018-02-09 MED ORDER — METOPROLOL SUCCINATE ER 25 MG PO TB24: 25.0000 mg | ORAL_TABLET | Freq: Every day | ORAL | 1 refills | Status: DC

## 2018-02-09 NOTE — Patient Instructions (Addendum)
Thank you for coming in today. Get labs today.  Get heart ultrasound soon.  Recheck in 1 week with me or Dr Linford Arnold.   STOP atenolol.  Start metoprolol.   We will see if your body is low on thyorid.   You should hear about scheduling the ECHO   Go to the emergency room if worsening.    Desiccated Thyroid oral tablets and capsules What is this medicine? DESICCATED THYROID (DES i key tid THYE roid )is a form of thyroid hormone. This medicine can improve symptoms of thyroid deficiency such as slow speech, lack of energy, weight gain, hair loss, dry skin, and feeling cold. It also helps to treat goiter (an enlarged thyroid gland). This medicine may be used for other purposes; ask your health care provider or pharmacist if you have questions. COMMON BRAND NAME(S): Armour Thyroid, Bio-Throid, Nature Thyroid, Nature-Throid, NP Thyroid, Westhroid, Westhroid-P, [facility] Thyroid What should I tell my health care provider before I take this medicine? They need to know if you have any of these conditions: -angina -diabetes -dieting or on a weight loss program -fertility problems -heart disease -high levels of thyroid hormone -pituitary gland problem -previous heart attack -an unusual or allergic reaction to thyroid hormones, other medicines, foods, dyes, or preservatives -pregnant or trying to get pregnant -breast-feeding How should I use this medicine? Take this medicine by mouth with water. It is best to take it on an empty stomach, at least 30 minutes before or 2 hours after food. Take this medicine at the same time each day. Follow the directions on the prescription label. Do not take your medication more often than directed. Talk to your pediatrician regarding the use of this medicine in children. While this drug may be prescribed for children, precautions do apply. Overdosage: If you think you have taken too much of this medicine contact a poison control center or emergency room at  once. NOTE: This medicine is only for you. Do not share this medicine with others. What if I miss a dose? If you miss a dose, take it as soon as you can. If it is almost time for your next dose, take only that dose. Do not take double or extra doses. What may interact with this medicine? -amiodarone -antacids -anti-thyroid medicines -calcium supplements -carbamazepine -cholestyramine -colestipol -digoxin -female hormones, including contraceptive or birth control pills -iron supplements -ketamine -liquid nutrition products like Ensure -medicines for colds and breathing difficulties -medicines for diabetes -medicines for mental depression -medicines or herbals used to decrease weight or appetite -phenobarbital or other barbiturate medications -phenytoin -prednisone or other corticosteroids -rifabutin -rifampin -soy isoflavones -sucralfate -theophylline -warfarin This list may not describe all possible interactions. Give your health care provider a list of all the medicines, herbs, non-prescription drugs, or dietary supplements you use. Also tell them if you smoke, drink alcohol, or use illegal drugs. Some items may interact with your medicine. What should I watch for while using this medicine? Do not switch brands of this medicine unless your health care professional agrees with the change. Ask questions if you are uncertain. You will need regular exams and occasional blood tests to check the response to treatment. If you are receiving this medicine for an underactive thyroid, it may be several weeks before you notice an improvement. Check with your doctor or health care professional if your symptoms do not improve. It may be necessary for you to take this medicine for the rest of your life. Do not stop using this medicine  unless your doctor or health care professional advises you to. This medicine can affect blood sugar levels. If you have diabetes, check your blood sugar as  directed. Some brands of this medicine may have a strong odor. This does not mean that the drug is spoiled. You may lose some of your hair when you first start treatment. With time, this usually corrects itself. If you are going to have surgery, tell your doctor or health care professional that you are taking this medicine. What side effects may I notice from receiving this medicine? Side effects that you should report to your doctor or health care professional as soon as possible: -allergic reactions like skin rash, itching or hives, swelling of the face, lips, or tongue -breathing problems -chest pain -excessive sweating or intolerance to heat -fast or irregular heartbeat -nervousness -swelling of ankles, feet, or legs -tremors Side effects that usually do not require medical attention (report to your doctor or health care professional if they continue or are bothersome): -changes in appetite -changes in menstrual periods -diarrhea -hair loss -headache -nausea, vomiting -tiredness -trouble sleeping -weight loss This list may not describe all possible side effects. Call your doctor for medical advice about side effects. You may report side effects to FDA at 1-800-FDA-1088. Where should I keep my medicine? Keep out of the reach of children. Store at room temperature between 15 and 30 degrees C (59 and 86 degrees F). Protect from light and moisture. Keep container tightly closed. Throw away any unused medicine after the expiration date. NOTE: This sheet is a summary. It may not cover all possible information. If you have questions about this medicine, talk to your doctor, pharmacist, or health care provider.  2018 Elsevier/Gold Standard (2007-10-13 10:37:21)

## 2018-02-09 NOTE — Progress Notes (Signed)
Rachel Keith is a 77 y.o. female who presents to [facility]: Primary Care Sports Medicine today for dyspnea on exertion, leg pain on exertion.  Rachel Keith has a pertinent medical history for chronic atrial fibrillation.  This is been typically previously well rate controlled with atenolol.  She notes that it will cause fatigue and she stopped it a few months ago.  She continues to take Eliquis for anticoagulation.  She notes recently over the last month she is been having worsening dyspnea on exertion associated with tachycardia.  She denies chest pain with these episodes.  She notes her symptoms rapidly improved with rest.  She denies significant leg swelling orthopnea.  She notes leg pain below.  Additionally first name has a history of uncontrolled hypothyroidism.  She has been diagnosed with hypothyroidism the past and did reasonably well with levothyroxine but developed alopecia and stopped the levothyroxine.  She is been very reluctant to restart thyroid medication and currently is not taking any.  Her last TSH was measured in July significantly elevated at 44 with a free T4 of 0.5.  She denies significant leg swelling or fatigue vomiting or diarrhea.  She notes however significant leg pain.  She points to her thighs and notes that with ambulation she has pain in her thighs.  This typically improves with rest.  She denies any history of claudication.  This is been ongoing now for about a month and is associated with the new onset dyspnea on exertion.   ROS as above:  Exam:  BP 138/83   Pulse 83   Ht 5' (1.524 m)   Wt 137 lb (62.1 kg)   BMI 26.76 kg/m  Wt Readings from Last 5 Encounters:  02/09/18 137 lb (62.1 kg)  11/25/17 133 lb (60.3 kg)  08/11/17 129 lb (58.5 kg)  06/24/17 131 lb (59.4 kg)  12/24/16 138 lb 1.9 oz (62.7 kg)    Gen: Well NAD HEENT: EOMI,  MMM Lungs: Normal work of breathing.  CTABL Heart: Irregular irregular rate about 80 bpm feet diastolic murmur present Abd: NABS, Soft. Nondistended, Nontender Exts: Brisk capillary refill, warm and well perfused.  Pulses intact feet bilaterally.  Twelve-lead EKG shows atrial fibrillation of her rate about 80 bpm with no significant ST segment elevation or depression.  Flattened lateral precordial T waves.  No significant change from prior EKG August 2018  Lab and Radiology Results Lab Results  Component Value Date   TSH 42.22 (H) 11/25/2017     Chemistry      Component Value Date/Time   NA 142 07/16/2017 0934   K 4.0 07/16/2017 0934   CL 102 07/16/2017 0934   CO2 29 07/16/2017 0934   BUN 11 07/16/2017 0934   CREATININE 1.05 (H) 07/16/2017 0934      Component Value Date/Time   CALCIUM 10.4 07/16/2017 0934   CALCIUM 10.5 05/24/2012 0856   ALKPHOS 64 09/14/2015 0431   AST 24 07/16/2017 0934   ALT 14 07/16/2017 0934   BILITOT 0.8 07/16/2017 0934     Lab Results  Component Value Date   WBC 7.9 11/14/2015   HGB 13.8 11/14/2015   HCT 42.8 11/14/2015   MCV 94.9 11/14/2015   PLT 197 11/14/2015    Echocardiogram dated January 01, 2017 Study Conclusions  - Left ventricle: The cavity size was normal. Wall thickness was   normal. Systolic function was normal. The estimated ejection   fraction was in the range of 50%  to 55%. Wall motion was normal;   there were no regional wall motion abnormalities. The study is   not technically sufficient to allow evaluation of LV diastolic   function. - Aortic valve: Mildly calcified leaflets. There was no stenosis.   There was mild regurgitation. - Mitral valve: Mildly thickened leaflets . There was moderate,   posteriorly directed regurgitation. - Left atrium: Severely dilated. - Right atrium: The atrium was mildly dilated. - Tricuspid valve: There was mild regurgitation. - Pulmonary arteries: PA peak pressure: 30 mm Hg (S). - Inferior vena cava: The vessel was normal in  size. The   respirophasic diameter changes were in the normal range (>= 50%),   consistent with normal central venous pressure.  Impressions:  - Compared to a prior study in 2017, the LVEF is lower at 50-55%.   There is mild AI and moderate MR. The LA is severely dilated.  Assessment and Plan: 77 y.o. female with  New onset dyspnea of exertion.  This is associated with chronic atrial fibrillation in the setting of patient discontinuing her rate control medication.  I believe her worsening dyspnea of exertion is very likely due to poor rate control.  However she certainly needs a extensive work-up.  Plan for repeat echocardiogram and labs listed below.  Additionally will start a new beta-blocker that is likely to be better tolerated and cause less fatigue.  Start low-dose metoprolol and recheck in 1 week.  Return sooner if needed.  In signs or symptoms reviewed with patient  New exertional leg pain.  Claudication is unlikely.  Patient has great pulses in her feet bilaterally.  I am worried she may have myositis from uncontrolled hypothyroidism.  And for work-up listed below.  Additionally work-up cardiac issues.  If not improved next step would be ABI.  Hypothyroidism: Patient has a history of uncontrolled hypothyroidism.  She is had extensive discussions with her PCP regarding the importance of treating hypothyroidism.  Plan to recheck thyroid labs today.  Likely will start thyroid medications.  Patient would like to avoid levothyroxine.  Likely will use nature thyroid or Armour Thyroid.  Recheck in 1 week.  Return sooner if needed.  I spent 40 minutes with this patient, greater than 50% was face-to-face time counseling regarding ddx and plan and medical history review with patient.    Orders Placed This Encounter  Procedures  . TSH  . T4, free  . T3, free  . CBC  . COMPLETE METABOLIC PANEL WITH GFR  . CK  . ECHOCARDIOGRAM COMPLETE    Standing Status:   Future    Standing  Expiration Date:   05/13/2019    Order Specific Question:   Where should this test be performed    Answer:   [facility]    Order Specific Question:   Perflutren DEFINITY (image enhancing agent) should be administered unless hypersensitivity or allergy exist    Answer:   Administer Perflutren   Meds ordered this encounter  Medications  . metoprolol succinate (TOPROL-XL) 25 MG 24 hr tablet    Sig: Take 1 tablet (25 mg total) by mouth daily.    Dispense:  90 tablet    Refill:  1     Historical information moved to improve visibility of documentation.  Past Medical History:  Diagnosis Date  . Anemia   . Anxiety   . Atrial fibrillation with RVR (HCC)   . Blood in the urine   . Chronic kidney disease (CKD), stage III (moderate) (  HCC)    [name] 05/28/2015  . High cholesterol   . History of blood transfusion 08/2015   "fell and split my head open; got 2 units"  . Hypertension   . Hypothyroidism   . Nephrolithiasis    Past Surgical History:  Procedure Laterality Date  . ABDOMINAL HYSTERECTOMY    . ANKLE FRACTURE SURGERY Left ~ 2006  . APPENDECTOMY    . CARDIOVERSION  2017   [name] 07/26/2015  . CATARACT EXTRACTION W/ INTRAOCULAR LENS  IMPLANT, BILATERAL Bilateral 09/06/2010  . CYSTOSCOPY W/ STONE MANIPULATION  06/2015 X 2  . FINGER SURGERY Right    "had bone growing out on the side of little finger"  . FRACTURE SURGERY    . LAPAROSCOPIC CHOLECYSTECTOMY    . TEE WITHOUT CARDIOVERSION N/A 10/04/2015   Procedure: TRANSESOPHAGEAL ECHOCARDIOGRAM (TEE);  Surgeon: Thurmon Fair, MD;  Location: [facility] ENDOSCOPY;  Service: Cardiovascular;  Laterality: N/A;  . TONSILLECTOMY     Social History   Tobacco Use  . Smoking status: Never Smoker  . Smokeless tobacco: Never Used  Substance Use Topics  . Alcohol use: Yes    Alcohol/week: 0.0 standard drinks    Comment: 09/13/2015 "might have 1 drink/year"   family history includes Alcohol abuse in her father and mother; Liver disease in her  mother.  Medications: Current Outpatient Medications  Medication Sig Dispense Refill  . B Complex Vitamins (B COMPLEX PO) Take 1 tablet by mouth daily.    . Biotin 5 MG TABS Take 1 tablet by mouth daily.    . diazepam (VALIUM) 10 MG tablet Take 1 tablet (10 mg total) by mouth daily as needed. for anxiety 30 tablet 0  . ELIQUIS 5 MG TABS tablet TAKE 1 TABLET (5 MG TOTAL) BY MOUTH 2 (TWO) TIMES DAILY. NAME BRAND ONLY 180 tablet 0  . estradiol cypionate (DEPO-ESTRADIOL) 5 MG/ML injection Inject 0.4 mLs (2 mg total) into the muscle every 21 ( twenty-one) days. 5 mL 0  . furosemide (LASIX) 40 MG tablet TAKE 1 TABLET BY MOUTH EVERY DAY 30 tablet 2  . losartan (COZAAR) 25 MG tablet TAKE 1 TABLET (25 MG TOTAL) BY MOUTH DAILY. 30 tablet 5  . Omega-3 Fatty Acids (FISH OIL) 1000 MG CAPS Take 3 capsules by mouth daily.     . Potassium 99 MG TABS Take 1 tablet by mouth daily.    . Selenium 200 MCG TABS Take 1 tablet by mouth daily.    . Syringe/Needle, Disp, (SYRINGE 3CC/25GX1") 25G X 1" 3 ML MISC For use in drawing up/administring depo-estradiol 100 each prn  . tretinoin (RETIN-A) 0.025 % cream Apply topically at bedtime as needed. 45 g 1  . TURMERIC CURCUMIN PO Take 1 tablet by mouth daily.    . metoprolol succinate (TOPROL-XL) 25 MG 24 hr tablet Take 1 tablet (25 mg total) by mouth daily. 90 tablet 1   No current facility-administered medications for this visit.    Allergies  Allergen Reactions  . Amlodipine Other (See Comments)    Didn't feel well on 2.5 mg dose.  Didn't feel well on 2.5 mg dose.   Marland Kitchen Lisinopril Other (See Comments)    Hair Loss  . Sulfa Antibiotics Rash    Broke out in her joints one time as a teenager  . Sulfacetamide Sodium Rash    Broke out in her joints one time as a teenager     Discussed warning signs or symptoms. Please see discharge instructions. Patient expresses understanding.

## 2018-02-10 ENCOUNTER — Other Ambulatory Visit: Payer: Self-pay | Admitting: Family Medicine

## 2018-02-10 ENCOUNTER — Telehealth: Payer: Self-pay | Admitting: Family Medicine

## 2018-02-10 LAB — T4, FREE: FREE T4: 0.4 ng/dL — AB (ref 0.8–1.8)

## 2018-02-10 LAB — CBC
HCT: 44.3 % (ref 35.0–45.0)
Hemoglobin: 15.2 g/dL (ref 11.7–15.5)
MCH: 33 pg (ref 27.0–33.0)
MCHC: 34.3 g/dL (ref 32.0–36.0)
MCV: 96.3 fL (ref 80.0–100.0)
MPV: 12.5 fL (ref 7.5–12.5)
Platelets: 115 10*3/uL — ABNORMAL LOW (ref 140–400)
RBC: 4.6 10*6/uL (ref 3.80–5.10)
RDW: 13.5 % (ref 11.0–15.0)
WBC: 9.7 10*3/uL (ref 3.8–10.8)

## 2018-02-10 LAB — T3, FREE: T3 FREE: 2 pg/mL — AB (ref 2.3–4.2)

## 2018-02-10 LAB — COMPLETE METABOLIC PANEL WITH GFR
AG RATIO: 1.6 (calc) (ref 1.0–2.5)
ALKALINE PHOSPHATASE (APISO): 83 U/L (ref 33–130)
ALT: 19 U/L (ref 6–29)
AST: 29 U/L (ref 10–35)
Albumin: 4.4 g/dL (ref 3.6–5.1)
BILIRUBIN TOTAL: 1.1 mg/dL (ref 0.2–1.2)
BUN / CREAT RATIO: 17 (calc) (ref 6–22)
BUN: 19 mg/dL (ref 7–25)
CHLORIDE: 100 mmol/L (ref 98–110)
CO2: 26 mmol/L (ref 20–32)
Calcium: 9.9 mg/dL (ref 8.6–10.4)
Creat: 1.11 mg/dL — ABNORMAL HIGH (ref 0.60–0.93)
GFR, EST AFRICAN AMERICAN: 55 mL/min/{1.73_m2} — AB (ref >=60)
GFR, Est Non African American: 48 mL/min/{1.73_m2} — ABNORMAL LOW (ref >=60)
Globulin: 2.7 g/dL (calc) (ref 1.9–3.7)
Glucose, Bld: 99 mg/dL (ref 65–99)
POTASSIUM: 3.5 mmol/L (ref 3.5–5.3)
Sodium: 139 mmol/L (ref 135–146)
TOTAL PROTEIN: 7.1 g/dL (ref 6.1–8.1)

## 2018-02-10 LAB — TSH: TSH: 55.16 mIU/L — ABNORMAL HIGH (ref 0.40–4.50)

## 2018-02-10 LAB — CK: CK TOTAL: 52 U/L (ref 29–143)

## 2018-02-10 MED ORDER — THYROID 15 MG PO TABS: 15.0000 mg | ORAL_TABLET | Freq: Every day | ORAL | 1 refills | Status: DC

## 2018-02-10 NOTE — Addendum Note (Signed)
Addended by: Rodolph Bong on: 02/10/2018 05:38 AM   Modules accepted: Orders

## 2018-02-10 NOTE — Telephone Encounter (Signed)
Dr. Denyse Amass   I called Rachel Keith about her Echocardiogram to let her know when it was scheduled and she stated she started her new medicine and now she is real light headed and not feeling well. She is not sure if this is from the medication or everything her body is going through please advise. - CF

## 2018-02-10 NOTE — Telephone Encounter (Signed)
Pt called back and states that her thyroid medicine can be approved for the name brand medicine and we should be getting a fax on it

## 2018-02-11 MED ORDER — LIOTHYRONINE SODIUM 25 MCG PO TABS: 25.0000 ug | ORAL_TABLET | Freq: Every day | ORAL | 0 refills | Status: DC

## 2018-02-11 NOTE — Telephone Encounter (Signed)
Armour Thyroid is not the preferred medication on your insurance.  They have given me some alternatives and we are going to try a similar medication which is different than the one you tried previously first.

## 2018-02-11 NOTE — Telephone Encounter (Signed)
Patient wants the thyroid medication switched back to Armour Thyroid. She has paid out of pocket for the medication.

## 2018-02-12 ENCOUNTER — Encounter: Payer: Self-pay | Admitting: Family Medicine

## 2018-02-12 DIAGNOSIS — I340 Nonrheumatic mitral (valve) insufficiency: Secondary | ICD-10-CM

## 2018-02-12 DIAGNOSIS — R001 Bradycardia, unspecified: Secondary | ICD-10-CM

## 2018-02-12 NOTE — Telephone Encounter (Signed)
Yes, did pick up a 30 day supply.

## 2018-02-12 NOTE — Telephone Encounter (Signed)
[facility] should still have the prescription for the Armour Thyroid on file so we do not have to send a new one unless she is changing pharmacies.

## 2018-02-16 ENCOUNTER — Encounter: Payer: Self-pay | Admitting: Family Medicine

## 2018-02-16 ENCOUNTER — Ambulatory Visit (INDEPENDENT_AMBULATORY_CARE_PROVIDER_SITE_OTHER): Payer: Medicare HMO | Admitting: Family Medicine

## 2018-02-16 VITALS — BP 141/72 | HR 99 | Ht 60.0 in | Wt 139.0 lb

## 2018-02-16 DIAGNOSIS — E032 Hypothyroidism due to medicaments and other exogenous substances: Secondary | ICD-10-CM

## 2018-02-16 DIAGNOSIS — I4891 Unspecified atrial fibrillation: Secondary | ICD-10-CM | POA: Diagnosis not present

## 2018-02-16 DIAGNOSIS — M791 Myalgia, unspecified site: Secondary | ICD-10-CM | POA: Diagnosis not present

## 2018-02-16 NOTE — Patient Instructions (Addendum)
Thank you for coming in today. Continue the armour thyroid.  We will want to recheck thyroid in about 6 weeks.   Your heart ultrasound  Is scheudled for tomorrow at 1015am.   Recheck with Dr Eppie Gibson in about 1 month.   Let me know if you need anything.

## 2018-02-16 NOTE — Progress Notes (Signed)
Rachel Keith is a 77 y.o. female who presents to [facility]: Primary Care Sports Medicine today for a fib, hypothyroid.   A fib: she was experiencing heart palpitations and shortness of breath last week. She was started on 25 mg of metoprolol and is feeling much better. She denies any current shortness of breath or palpitations. She had many positive things to say about the metoprolol. She says that some days she took 50 mg and has been very pleased with the results of the increased dose. She is asking about increasing her prescribed dose today.   Thyroid: she has a long history of hypothyroid with inconsistent medication use. She says that she cannot take generic levothyroxine due to significant hair loss. She has been taking Armour thyroid and has been extremely happy with the results so far. She does say however that she wants to wait 4 weeks to see the medications full effects. She does not have any leg pain which she was experiencing last week.    ROS as above:  Exam:  BP (!) 141/72   Pulse 99   Ht 5' (1.524 m)   Wt 139 lb (63 kg)   BMI 27.15 kg/m  Wt Readings from Last 5 Encounters:  02/16/18 139 lb (63 kg)  02/09/18 137 lb (62.1 kg)  11/25/17 133 lb (60.3 kg)  08/11/17 129 lb (58.5 kg)  06/24/17 131 lb (59.4 kg)    Gen: Well NAD HEENT: EOMI,  MMM Lungs: Normal work of breathing. CTABL Heart: irregularly irregular rhythm, no MRG Abd: NABS, Soft. Nondistended, Nontender Exts: Brisk capillary refill, warm and well perfused.   Lab and Radiology Results No results found for this or any previous visit (from the past 72 hour(s)). No results found.    Assessment and Plan: 77 y.o. female with a fib, hypothyroid.   A fib: she is doing extremely well on her metoprolol. She can continue to adjust and titrate from 25 or 50 mg of metoprolol if this makes her feel better. She denies any  palpations or shortness of breath and this is reassuring. If better at 50mg  will prescribe 50mg  sized dose. Will continue to adjust.  ECHO pending tomorrow.  Follow up with PCP.   Hypothyroid: She was able to get the Armour thyroid which is preferred to her rather than generic levothyroxine. She has not had any hair loss symptoms to date. She has also not experienced anymore myalgias which were likely due to her hypothyroid. She should continue to take the Armour thyroid and says that it is not a financial burden to her.     Historical information moved to improve visibility of documentation.  Past Medical History:  Diagnosis Date  . Anemia   . Anxiety   . Atrial fibrillation with RVR (HCC)   . Blood in the urine   . Chronic kidney disease (CKD), stage III (moderate) (HCC)    [name] 05/28/2015  . High cholesterol   . History of blood transfusion 08/2015   "fell and split my head open; got 2 units"  . Hypertension   . Hypothyroidism   . Nephrolithiasis    Past Surgical History:  Procedure Laterality Date  . ABDOMINAL HYSTERECTOMY    . ANKLE FRACTURE SURGERY Left ~ 2006  . APPENDECTOMY    . CARDIOVERSION  2017   [name] 07/26/2015  . CATARACT EXTRACTION W/ INTRAOCULAR LENS  IMPLANT, BILATERAL Bilateral 09/06/2010  . CYSTOSCOPY W/ STONE MANIPULATION  06/2015 X  2  . FINGER SURGERY Right    "had bone growing out on the side of little finger"  . FRACTURE SURGERY    . LAPAROSCOPIC CHOLECYSTECTOMY    . TEE WITHOUT CARDIOVERSION N/A 10/04/2015   Procedure: TRANSESOPHAGEAL ECHOCARDIOGRAM (TEE);  Surgeon: Thurmon Fair, MD;  Location: [facility] ENDOSCOPY;  Service: Cardiovascular;  Laterality: N/A;  . TONSILLECTOMY     Social History   Tobacco Use  . Smoking status: Never Smoker  . Smokeless tobacco: Never Used  Substance Use Topics  . Alcohol use: Yes    Alcohol/week: 0.0 standard drinks    Comment: 09/13/2015 "might have 1 drink/year"   family history includes Alcohol abuse in her father  and mother; Liver disease in her mother.  Medications: Current Outpatient Medications  Medication Sig Dispense Refill  . B Complex Vitamins (B COMPLEX PO) Take 1 tablet by mouth daily.    . Biotin 5 MG TABS Take 1 tablet by mouth daily.    . diazepam (VALIUM) 10 MG tablet Take 1 tablet (10 mg total) by mouth daily as needed. for anxiety 30 tablet 0  . ELIQUIS 5 MG TABS tablet TAKE 1 TABLET (5 MG TOTAL) BY MOUTH 2 (TWO) TIMES DAILY. NAME BRAND ONLY 180 tablet 0  . estradiol cypionate (DEPO-ESTRADIOL) 5 MG/ML injection Inject 0.4 mLs (2 mg total) into the muscle every 21 ( twenty-one) days. 5 mL 0  . furosemide (LASIX) 40 MG tablet TAKE 1 TABLET BY MOUTH EVERY DAY 30 tablet 2  . losartan (COZAAR) 25 MG tablet TAKE 1 TABLET (25 MG TOTAL) BY MOUTH DAILY. 30 tablet 5  . metoprolol succinate (TOPROL-XL) 25 MG 24 hr tablet Take 1 tablet (25 mg total) by mouth daily. 90 tablet 1  . Omega-3 Fatty Acids (FISH OIL) 1000 MG CAPS Take 3 capsules by mouth daily.     . Potassium 99 MG TABS Take 1 tablet by mouth daily.    . Selenium 200 MCG TABS Take 1 tablet by mouth daily.    . Syringe/Needle, Disp, (SYRINGE 3CC/25GX1") 25G X 1" 3 ML MISC For use in drawing up/administring depo-estradiol 100 each prn  . thyroid (ARMOUR) 15 MG tablet Take 15 mg by mouth daily.    Marland Kitchen tretinoin (RETIN-A) 0.025 % cream Apply topically at bedtime as needed. 45 g 1  . TURMERIC CURCUMIN PO Take 1 tablet by mouth daily.     No current facility-administered medications for this visit.    Allergies  Allergen Reactions  . Amlodipine Other (See Comments)    Didn't feel well on 2.5 mg dose.  Didn't feel well on 2.5 mg dose.   Marland Kitchen Lisinopril Other (See Comments)    Hair Loss  . Sulfa Antibiotics Rash    Broke out in her joints one time as a teenager  . Sulfacetamide Sodium Rash    Broke out in her joints one time as a teenager     Discussed warning signs or symptoms. Please see discharge instructions. Patient expresses  understanding.

## 2018-02-17 ENCOUNTER — Ambulatory Visit (HOSPITAL_BASED_OUTPATIENT_CLINIC_OR_DEPARTMENT_OTHER)
Admission: RE | Admit: 2018-02-17 | Discharge: 2018-02-17 | Disposition: A | Discharge status: Home / Self Care | Payer: Medicare HMO | Source: Ambulatory Visit | Attending: Family Medicine | Admitting: Family Medicine

## 2018-02-17 DIAGNOSIS — I083 Combined rheumatic disorders of mitral, aortic and tricuspid valves: Secondary | ICD-10-CM | POA: Insufficient documentation

## 2018-02-17 DIAGNOSIS — R55 Syncope and collapse: Secondary | ICD-10-CM | POA: Insufficient documentation

## 2018-02-17 DIAGNOSIS — N183 Chronic kidney disease, stage 3 unspecified: Secondary | ICD-10-CM

## 2018-02-17 DIAGNOSIS — I340 Nonrheumatic mitral (valve) insufficiency: Secondary | ICD-10-CM

## 2018-02-17 DIAGNOSIS — E785 Hyperlipidemia, unspecified: Secondary | ICD-10-CM | POA: Diagnosis not present

## 2018-02-17 DIAGNOSIS — I1310 Hypertensive heart and chronic kidney disease without heart failure, with stage 1 through stage 4 chronic kidney disease, or unspecified chronic kidney disease: Secondary | ICD-10-CM | POA: Diagnosis not present

## 2018-02-17 DIAGNOSIS — M791 Myalgia, unspecified site: Secondary | ICD-10-CM

## 2018-02-17 DIAGNOSIS — I4891 Unspecified atrial fibrillation: Secondary | ICD-10-CM | POA: Diagnosis not present

## 2018-02-17 DIAGNOSIS — E038 Other specified hypothyroidism: Secondary | ICD-10-CM

## 2018-02-17 DIAGNOSIS — R0609 Other forms of dyspnea: Secondary | ICD-10-CM | POA: Diagnosis not present

## 2018-02-17 NOTE — Progress Notes (Signed)
  Echocardiogram 2D Echocardiogram has been performed.  Rachel Keith 02/17/2018, 10:29 AM

## 2018-03-17 ENCOUNTER — Ambulatory Visit (INDEPENDENT_AMBULATORY_CARE_PROVIDER_SITE_OTHER): Payer: Medicare HMO | Admitting: Family Medicine

## 2018-03-17 ENCOUNTER — Encounter: Payer: Self-pay | Admitting: Family Medicine

## 2018-03-17 VITALS — BP 136/83 | HR 86 | Ht 60.93 in | Wt 137.0 lb

## 2018-03-17 DIAGNOSIS — I4891 Unspecified atrial fibrillation: Secondary | ICD-10-CM

## 2018-03-17 DIAGNOSIS — E032 Hypothyroidism due to medicaments and other exogenous substances: Secondary | ICD-10-CM

## 2018-03-17 DIAGNOSIS — L659 Nonscarring hair loss, unspecified: Secondary | ICD-10-CM

## 2018-03-17 MED ORDER — METOPROLOL SUCCINATE ER 50 MG PO TB24: 50.0000 mg | ORAL_TABLET | Freq: Every day | ORAL | 1 refills | Status: DC

## 2018-03-17 MED ORDER — DIAZEPAM 10 MG PO TABS: 10.0000 mg | ORAL_TABLET | Freq: Every day | ORAL | 0 refills | Status: DC | PRN

## 2018-03-17 NOTE — Progress Notes (Signed)
Subjective:    CC:   HPI:  He is here today for follow-up of atrial fibrillation with RVR-she recently started metoprolol and has actually been feeling much better.  She is up to 2 tabs which is a total of 50 mg.  She says she feels a lot better on it compared to the atenolol.  She feels like the pains that she was expensing in her legs have improved her palpitations feel like they are better controlled.  She still occasionally will notice that she gets a little breathless  Hypothyroidism-she is also now on Armour Thyroid and feels like it is feeling better and working better than generic levothyroxine.  She feels like the hair loss has stopped and is improved significantly.  Her last TSH was 55 on October 2.  Did check with her insurance and they will pay for the Armour Thyroid we just have to send a letter noting the fact that she did have significant hair loss on levothyroxine.  Past medical history, Surgical history, Family history not pertinant except as noted below, Social history, Allergies, and medications have been entered into the medical record, reviewed, and corrections made.   Review of Systems: No fevers, chills, night sweats, weight loss, chest pain, or shortness of breath.   Objective:    General: Well Developed, well nourished, and in no acute distress.  Neuro: Alert and oriented x3, extra-ocular muscles intact, sensation grossly intact.  HEENT: Normocephalic, atraumatic  Skin: Warm and dry, no rashes. Cardiac: Regular rate and rhythm, no murmurs rubs or gallops, no lower extremity edema.  Respiratory: Clear to auscultation bilaterally. Not using accessory muscles, speaking in full sentences.   Impression and Recommendations:    Atrial Fibrillation-continue with metoprolol.  That she is actually taking 50 mg we will go ahead and send over new prescription so she does not have to take 2 tabs.  We will keep an eye on this and can adjust dose if needed for maximal blood  pressure control.  Her initial blood pressure was elevated but repeat looks good.  Hypothyroidism-plan to recheck TSH in 2 to 4 weeks.  That way she will been on medication for anywhere between 6 to 8 weeks total and then we can adjust the dose.  She is actually taking 2 tabs of the 15 mg Armour Thyroid.  Hair loss -stopped on the Armour Thyroid.  We will try to write a letter to her insurance to see if they will cover the Armour Thyroid right now they are requiring a letter from [place].

## 2018-03-25 ENCOUNTER — Encounter: Payer: Self-pay | Admitting: Cardiology

## 2018-04-05 DIAGNOSIS — E032 Hypothyroidism due to medicaments and other exogenous substances: Secondary | ICD-10-CM | POA: Diagnosis not present

## 2018-04-05 NOTE — Progress Notes (Signed)
HPI: FU MR; seen previously for CHF and atrial fibrillation. Monitor 3/17 showed afib with rates 110-150 (performed at [facility]). 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 Chest xray 5/17 showed bilateral pleural effusions. She apparently had attempt at TEE guided cardioversion in [place]. She apparently reverted immediately back to atrial fibrillation. I do not have those records available. Patient had transesophageal echocardiogram at [facility] May 2017 that showed normal LV systolic function, mild to moderate aortic insufficiency, moderate mitral regurgitation, moderate left atrial and right atrial enlargement and mild right ventricular enlargement. Moderate to severe tricuspid regurgitation. LV function improved with rate control of atrial fibrillation.   Last echocardiogram October 2019 showed ejection fraction 50 to 55%, mild aortic regurgitation, moderate to severe mitral regurgitation, severe biatrial enlargement, moderate tricuspid regurgitation and moderate pulmonary hypertension.  Since last seen, the patient has dyspnea with more extreme activities but not with routine activities. It is relieved with rest. It is not associated with chest pain. There is no orthopnea, PND or pedal edema. There is no syncope or palpitations. There is no exertional chest pain.   Current Outpatient Medications  Medication Sig Dispense Refill  . B Complex Vitamins (B COMPLEX PO) Take 1 tablet by mouth daily.    . Biotin 5 MG TABS Take 1 tablet by mouth daily.    . diazepam (VALIUM) 10 MG tablet Take 1 tablet (10 mg total) by mouth daily as needed. for anxiety 90 tablet 0  . ELIQUIS 5 MG TABS tablet TAKE 1 TABLET (5 MG TOTAL) BY MOUTH 2 (TWO) TIMES DAILY. NAME BRAND ONLY 180 tablet 0  . furosemide (LASIX) 40 MG tablet Keep upcoming appt for future refills ./cy 30 tablet 1  .  metoprolol succinate (TOPROL-XL) 50 MG 24 hr tablet Take 1 tablet (50 mg total) by mouth daily. 90 tablet 1  . Omega-3 Fatty Acids (FISH OIL) 1000 MG CAPS Take 3 capsules by mouth daily.     . Selenium 200 MCG TABS Take 1 tablet by mouth daily.    Marland Kitchen thyroid (ARMOUR) 30 MG tablet Take 30 mg by mouth daily before breakfast.    . tretinoin (RETIN-A) 0.025 % cream Apply topically at bedtime as needed. 45 g 1  . TURMERIC CURCUMIN PO Take 1 tablet by mouth daily.     No current facility-administered medications for this visit.      Past Medical History:  Diagnosis Date  . Anemia   . Anxiety   . Atrial fibrillation with RVR (HCC)   . Blood in the urine   . Chronic kidney disease (CKD), stage III (moderate) (HCC)    [name] 05/28/2015  . High cholesterol   . History of blood transfusion 08/2015   "fell and split my head open; got 2 units"  . Hypertension   . Hypothyroidism   . Nephrolithiasis     Past Surgical History:  Procedure Laterality Date  . ABDOMINAL HYSTERECTOMY    . ANKLE FRACTURE SURGERY Left ~ 2006  . APPENDECTOMY    . CARDIOVERSION  2017   [name] 07/26/2015  . CATARACT EXTRACTION W/ INTRAOCULAR LENS  IMPLANT, BILATERAL Bilateral 09/06/2010  . CYSTOSCOPY W/ STONE MANIPULATION  06/2015 X 2  . FINGER SURGERY Right    "had bone growing out on the side of little finger"  . FRACTURE SURGERY    . LAPAROSCOPIC CHOLECYSTECTOMY    . TEE  WITHOUT CARDIOVERSION N/A 10/04/2015   Procedure: TRANSESOPHAGEAL ECHOCARDIOGRAM (TEE);  Surgeon: Thurmon FairMihai Croitoru, MD;  Location: [facility] ENDOSCOPY;  Service: Cardiovascular;  Laterality: N/A;  . TONSILLECTOMY      Social History   Socioeconomic History  . Marital status: Divorced    Spouse name: Not on file  . Number of children: 4  . Years of education: Not on file  . Highest education level: Not on file  Occupational History  . Occupation: retired  Engineer, productionocial Needs  . Financial resource strain: Not on file  . Food insecurity:    Worry: Not on  file    Inability: Not on file  . Transportation needs:    Medical: Not on file    Non-medical: Not on file  Tobacco Use  . Smoking status: Never Smoker  . Smokeless tobacco: Never Used  Substance and Sexual Activity  . Alcohol use: Yes    Alcohol/week: 0.0 standard drinks    Comment: 09/13/2015 "might have 1 drink/year"  . Drug use: No  . Sexual activity: Never  Lifestyle  . Physical activity:    Days per week: Not on file    Minutes per session: Not on file  . Stress: Not on file  Relationships  . Social connections:    Talks on phone: Not on file    Gets together: Not on file    Attends religious service: Not on file    Active member of club or organization: Not on file    Attends meetings of clubs or organizations: Not on file    Relationship status: Not on file  . Intimate partner violence:    Fear of current or ex partner: Not on file    Emotionally abused: Not on file    Physically abused: Not on file    Forced sexual activity: Not on file  Other Topics Concern  . Not on file  Social History Narrative   1 caffeinated drink daily     Family History  Problem Relation Age of Onset  . Alcohol abuse Mother   . Liver disease Mother   . Alcohol abuse Father     ROS: no fevers or chills, productive cough, hemoptysis, dysphasia, odynophagia, melena, hematochezia, dysuria, hematuria, rash, seizure activity, orthopnea, PND, pedal edema, claudication. Remaining systems are negative.  Physical Exam: Well-developed well-nourished in no acute distress.  Skin is warm and dry.  HEENT is normal.  Neck is supple.  Chest is clear to auscultation with normal expansion.  Cardiovascular exam is irregular, 3/6 systolic murmur apex Abdominal exam nontender or distended. No masses palpated. Extremities show no edema. neuro grossly intact   A/P  1 mitral regurgitation-I have personally reviewed the patient's echocardiogram from October.  Mitral regurgitation is now severe.  LV  function is low normal to mildly reduced.  It is worse compared to previous.  I think she should proceed with mitral valve repair/replacement.  She also has severe tricuspid regurgitation and probable mild aortic insufficiency.  We will arrange TEE and then right and left cardiac catheterization.  The risks and benefits including myocardial infarction, CVA and death discussed and she agrees to proceed.  We will then refer to cardiothoracic surgery.  2 permanent atrial fibrillation-plan to continue atenolol for rate control.  Continue apixaban at present dose.  We will need to hold apixaban 2 days prior to catheterization and resume following procedure.  3 chronic diastolic congestive heart failure-she is euvolemic today on examination.  Continue present dose of Lasix and follow.  Hold Lasix the day of and the day before her procedure.  4 hypertension-patient's blood pressure is controlled.  Continue present medications and follow.  Olga Millers, MD

## 2018-04-06 LAB — TSH: TSH: 21.53 m[IU]/L — AB (ref 0.40–4.50)

## 2018-04-07 ENCOUNTER — Other Ambulatory Visit: Payer: Self-pay | Admitting: Cardiology

## 2018-04-12 ENCOUNTER — Other Ambulatory Visit: Payer: Self-pay | Admitting: *Deleted

## 2018-04-12 DIAGNOSIS — E032 Hypothyroidism due to medicaments and other exogenous substances: Secondary | ICD-10-CM

## 2018-04-12 MED ORDER — THYROID 15 MG PO TABS: 30.0000 mg | ORAL_TABLET | Freq: Every day | ORAL | 0 refills | Status: DC

## 2018-04-14 ENCOUNTER — Encounter: Payer: Self-pay | Admitting: *Deleted

## 2018-04-14 ENCOUNTER — Other Ambulatory Visit: Payer: Self-pay | Admitting: *Deleted

## 2018-04-14 ENCOUNTER — Encounter: Payer: Self-pay | Admitting: Cardiology

## 2018-04-14 ENCOUNTER — Telehealth: Payer: Self-pay | Admitting: Cardiology

## 2018-04-14 ENCOUNTER — Ambulatory Visit: Payer: Medicare HMO | Admitting: Cardiology

## 2018-04-14 VITALS — BP 132/88 | HR 90 | Ht 60.93 in | Wt 141.8 lb

## 2018-04-14 DIAGNOSIS — I340 Nonrheumatic mitral (valve) insufficiency: Secondary | ICD-10-CM

## 2018-04-14 DIAGNOSIS — I10 Essential (primary) hypertension: Secondary | ICD-10-CM | POA: Diagnosis not present

## 2018-04-14 DIAGNOSIS — I059 Rheumatic mitral valve disease, unspecified: Secondary | ICD-10-CM

## 2018-04-14 DIAGNOSIS — I4821 Permanent atrial fibrillation: Secondary | ICD-10-CM | POA: Diagnosis not present

## 2018-04-14 DIAGNOSIS — I051 Rheumatic mitral insufficiency: Secondary | ICD-10-CM

## 2018-04-14 NOTE — Telephone Encounter (Signed)
Spoke with pt, her TEE and R&L cath will be 04-21-18. She will arrive at 9 am that morning. Instructions discussed in detail with the patient and mailed to her confirmed home address.

## 2018-04-14 NOTE — Telephone Encounter (Signed)
° ° °  Patient called back, states 04/21/18 is a good date for her to have procedures. Patient has additional questions about test. Please call

## 2018-04-14 NOTE — Telephone Encounter (Signed)
Patient would like a call regarding how long for her cardiac and TEE. She is getting a ride and needs to know the time frame.

## 2018-04-14 NOTE — Patient Instructions (Signed)
Medication Instructions:   NO CHANGE TODAY  Testing/Procedures:  Your physician has requested that you have a TEE. During a TEE, sound waves are used to create images of your heart. It provides your doctor with information about the size and shape of your heart and how well your heart's chambers and valves are working. In this test, a transducer is attached to the end of a flexible tube that's guided down your throat and into your esophagus (the tube leading from you mouth to your stomach) to get a more detailed image of your heart. You are not awake for the procedure. Please see the instruction sheet given to you today. For further information please visit [email or web address]  Your physician has requested that you have a cardiac catheterization. Cardiac catheterization is used to diagnose and/or treat various heart conditions. Doctors may recommend this procedure for a number of different reasons. The most common reason is to evaluate chest pain. Chest pain can be a symptom of coronary artery disease (CAD), and cardiac catheterization can show whether plaque is narrowing or blocking your heart's arteries. This procedure is also used to evaluate the valves, as well as measure the blood flow and oxygen levels in different parts of your heart. For further information please visit [email or web address] Please follow instruction sheet, as given.   Follow-Up:  Your physician recommends that you schedule a follow-up appointment in: 8 WEEKS WITH DR CRENSHAW    Transesophageal Echocardiogram Transesophageal echocardiography (TEE) is a picture test of your heart using sound waves. The pictures taken can give very detailed pictures of your heart. This can help your doctor see if there are problems with your heart. TEE can check:  If your heart has blood clots in it.  How well your heart valves are working.  If you have an infection on the inside of your heart.  Some of the major arteries of your  heart.  If your heart valve is working after a Psychologist, forensic.  Your heart before a procedure that uses a shock to your heart to get the rhythm back to normal.  What happens before the procedure?  Do not eat or drink for 6 hours before the procedure or as told by your doctor.  Make plans to have someone drive you home after the procedure. Do not drive yourself home.  An IV tube will be put in your arm. What happens during the procedure?  You will be given a medicine to help you relax (sedative). It will be given through the IV tube.  A numbing medicine will be sprayed or gargled in the back of your throat to help numb it.  The tip of the probe is placed into the back of your mouth. You will be asked to swallow. This helps to pass the probe into your esophagus.  Once the tip of the probe is in the right place, your doctor can take pictures of your heart.  You may feel pressure at the back of your throat. What happens after the procedure?  You will be taken to a recovery area so the sedative can wear off.  Your throat may be sore and scratchy. This will go away slowly over time.  You will go home when you are fully awake and able to swallow liquids.  You should have someone stay with you for the next 24 hours.  Do not drive or operate machinery for the next 24 hours. This information is not intended to replace advice given to you by your health  care provider. Make sure you discuss any questions you have with your health care provider. Document Released: 02/23/2009 Document Revised: 10/04/2015 Document Reviewed: 10/28/2012 Elsevier Interactive Patient Education  2018 [employer]   Coronary Angiogram A coronary angiogram is an X-ray procedure that is used to examine the arteries in the heart. In this procedure, a dye (contrast dye) is injected through a long, thin tube (catheter). The catheter is inserted through the groin, wrist, or arm. The dye is injected into each artery, then  X-rays are taken to show if there is a blockage in the arteries of the heart. This procedure can also show if you have valve disease or a disease of the aorta, and it can be used to check the overall function of your heart muscle. You may have a coronary angiogram if:  You are having chest pain, or other symptoms of angina, and you are at risk for heart disease.  You have an abnormal electrocardiogram (ECG) or stress test.  You have chest pain and heart failure.  You are having irregular heart rhythms.  You and your health care provider determine that the benefits of the test information outweigh the risks of the procedure.  Let your health care provider know about:  Any allergies you have, including allergies to contrast dye.  All medicines you are taking, including vitamins, herbs, eye drops, creams, and over-the-counter medicines.  Any problems you or family members have had with anesthetic medicines.  Any blood disorders you have.  Any surgeries you have had.  History of kidney problems or kidney failure.  Any medical conditions you have.  Whether you are pregnant or may be pregnant. What are the risks? Generally, this is a safe procedure. However, problems may occur, including:  Infection.  Allergic reaction to medicines or dyes that are used.  Bleeding from the access site or other locations.  Kidney injury, especially in people with impaired kidney function.  Stroke (rare).  Heart attack (rare).  Damage to other structures or organs.  What happens before the procedure? Staying hydrated Follow instructions from your health care provider about hydration, which may include:  Up to 2 hours before the procedure - you may continue to drink clear liquids, such as water, clear fruit juice, black coffee, and plain tea.  Eating and drinking restrictions Follow instructions from your health care provider about eating and drinking, which may include:  8 hours before  the procedure - stop eating heavy meals or foods such as meat, fried foods, or fatty foods.  6 hours before the procedure - stop eating light meals or foods, such as toast or cereal.  2 hours before the procedure - stop drinking clear liquids.  General instructions  Ask your health care provider about: ? Changing or stopping your regular medicines. This is especially important if you are taking diabetes medicines or blood thinners. ? Taking medicines such as ibuprofen. These medicines can thin your blood. Do not take these medicines before your procedure if your health care provider instructs you not to, though aspirin may be recommended prior to coronary angiograms.  Plan to have someone take you home from the hospital or clinic.  You may need to have blood tests or X-rays done. What happens during the procedure?  An IV tube will be inserted into one of your veins.  You will be given one or more of the following: ? A medicine to help you relax (sedative). ? A medicine to numb the area where the catheter  will be inserted into an artery (local anesthetic).  To reduce your risk of infection: ? Your health care team will wash or sanitize their hands. ? Your skin will be washed with soap. ? Hair may be removed from the area where the catheter will be inserted.  You will be connected to a continuous ECG monitor.  The catheter will be inserted into an artery. The location may be in your groin, in your wrist, or in the fold of your arm (near your elbow).  A type of X-ray (fluoroscopy) will be used to help guide the catheter to the opening of the blood vessel that is being examined.  A dye will be injected into the catheter, and X-rays will be taken. The dye will help to show where any narrowing or blockages are located in the heart arteries.  Tell your health care provider if you have any chest pain or trouble breathing during the procedure.  If blockages are found, your health care  provider may perform another procedure, such as inserting a coronary stent. The procedure may vary among health care providers and hospitals. What happens after the procedure?  After the procedure, you will need to keep the area still for a few hours, or for as long as told by your health care provider. If the procedure is done through the groin, you will be instructed to not bend and not cross your legs.  The insertion site will be checked frequently.  The pulse in your foot or wrist will be checked frequently.  You may have additional blood tests, X-rays, and a test that records the electrical activity of your heart (ECG).  Do not drive for 24 hours if you were given a sedative. Summary  A coronary angiogram is an X-ray procedure that is used to look into the arteries in the heart.  During the procedure, a dye (contrast dye) is injected through a long, thin tube (catheter). The catheter is inserted through the groin, wrist, or arm.  Tell your health care provider about any allergies you have, including allergies to contrast dye.  After the procedure, you will need to keep the area still for a few hours, or for as long as told by your health care provider. This information is not intended to replace advice given to you by your health care provider. Make sure you discuss any questions you have with your health care provider. Document Released: 11/02/2002 Document Revised: 02/08/2016 Document Reviewed: 02/08/2016 Elsevier Interactive Patient Education  [employer].

## 2018-04-16 ENCOUNTER — Other Ambulatory Visit: Payer: Self-pay | Admitting: Family Medicine

## 2018-04-16 NOTE — Telephone Encounter (Signed)
Called Pt, she reports she is still taking Eliquis 5mg  BID. No issues. Rx refilled.

## 2018-04-19 ENCOUNTER — Telehealth: Payer: Self-pay | Admitting: *Deleted

## 2018-04-19 NOTE — Telephone Encounter (Signed)
Pt contacted [facility] for: Wednesday April 21, 2018 11:30 AM Ver[facility] at: 9 AM /TEE 10 AM  Nothing to eat or drink after midnight prior to procedure. Contrast allergy: no Verify no diabetes medications.  Hold: Eliquis-04/19/18 until post procedure. Furosemide-AM of procedure.  Except hold medications AM meds can be  taken pre-cath with sip of water including: ASA 81 mg  Confirm patient has responsible person to drive home post procedure and for 24 hours after you arrive home.  LMTCB to discuss instructions with patient, ask patient if she has had pre-procedure lab done.

## 2018-04-19 NOTE — Telephone Encounter (Addendum)
I called patient to review TEE/R/LHC instructions scheduled for Wednesday April 21, 2018. Pt states she wants to cancel both procedures, she is concerned about cost and has not had a chance to check with her insurance about her part of medical cost. Pt states she wants to go through the holidays without seeing any doctors or having any medical procedures/testing. I offered to reschedule procedures for her, she declined, she was not sure when she would be ready to reschedule.   Pt asked for a call from Dr Ludwig Clarksrenshaw's office to discuss her concerns/follow-up.

## 2018-04-19 NOTE — Telephone Encounter (Signed)
Spoke with pt, she did not want to talk to me and has canceled her follow up appointment.

## 2018-04-19 NOTE — Telephone Encounter (Signed)
Tee/R/LHC have been cancelled. I will forward to Dr Darel Hongrenshaw/Debra, RN.

## 2018-04-21 ENCOUNTER — Ambulatory Visit (HOSPITAL_COMMUNITY): Admit: 2018-04-21 | Payer: Medicare HMO | Admitting: Cardiology

## 2018-04-21 ENCOUNTER — Encounter (HOSPITAL_COMMUNITY): Payer: Self-pay

## 2018-04-21 SURGERY — ECHOCARDIOGRAM, TRANSESOPHAGEAL
Anesthesia: Moderate Sedation

## 2018-04-21 SURGERY — RIGHT/LEFT HEART CATH AND CORONARY ANGIOGRAPHY
Anesthesia: LOCAL

## 2018-04-25 ENCOUNTER — Other Ambulatory Visit: Payer: Self-pay | Admitting: Cardiology

## 2018-04-26 NOTE — Telephone Encounter (Signed)
Rx request sent to pharmacy.  

## 2018-04-28 ENCOUNTER — Encounter: Payer: Medicare HMO | Admitting: Thoracic Surgery (Cardiothoracic Vascular Surgery)

## 2018-04-29 IMAGING — US US RENAL
1 series · 14 of 25 positions shown · non-contrast
Comparison: None.

CLINICAL DATA: 74-year-old female inpatient with gross hematuria.
Chronic kidney disease.

EXAM:
RENAL / URINARY TRACT ULTRASOUND COMPLETE

[Series 1: us renal · 0.19mm/px · 14 of 32 slices shown]
[im 1/32]
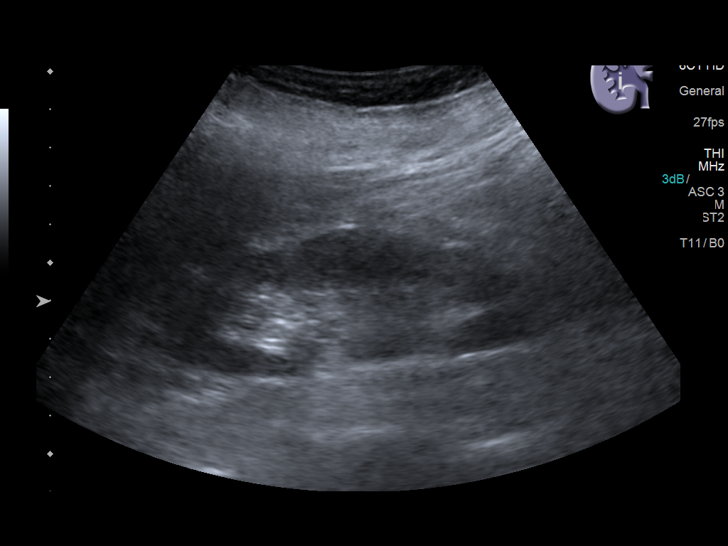
[im 3/32]
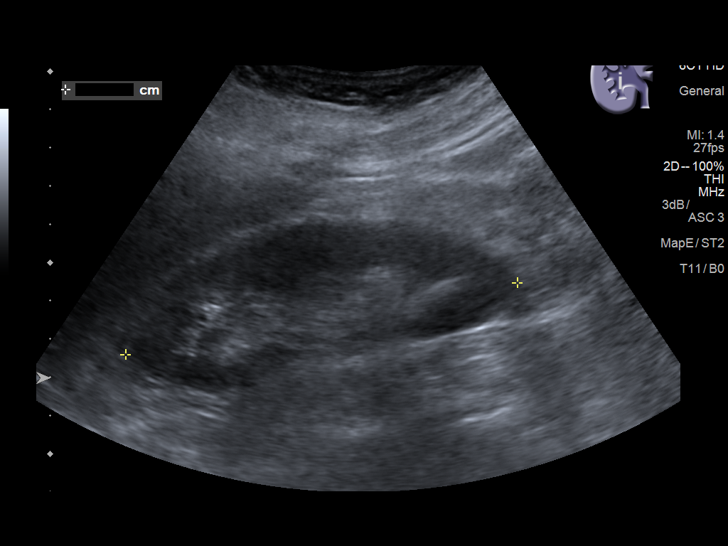
[im 6/32]
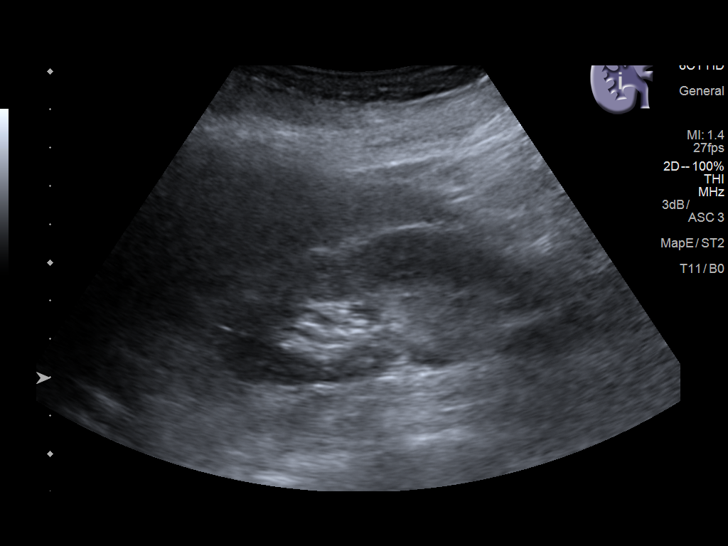
[im 8/32]
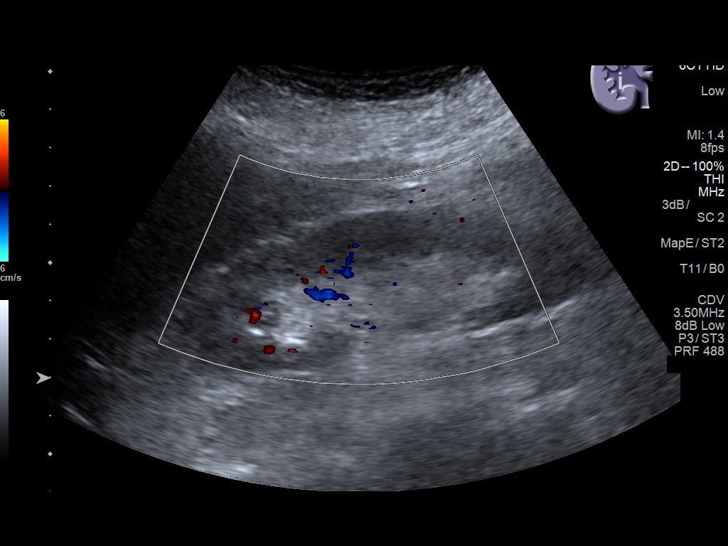
[im 11/32]
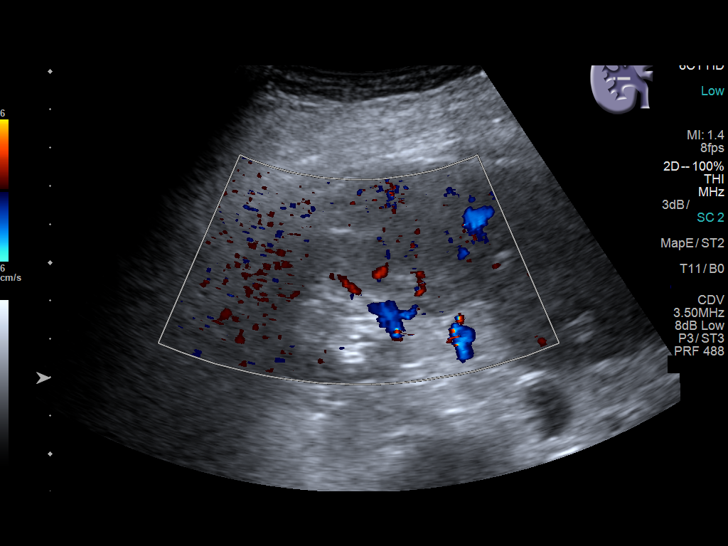
[im 12/32]
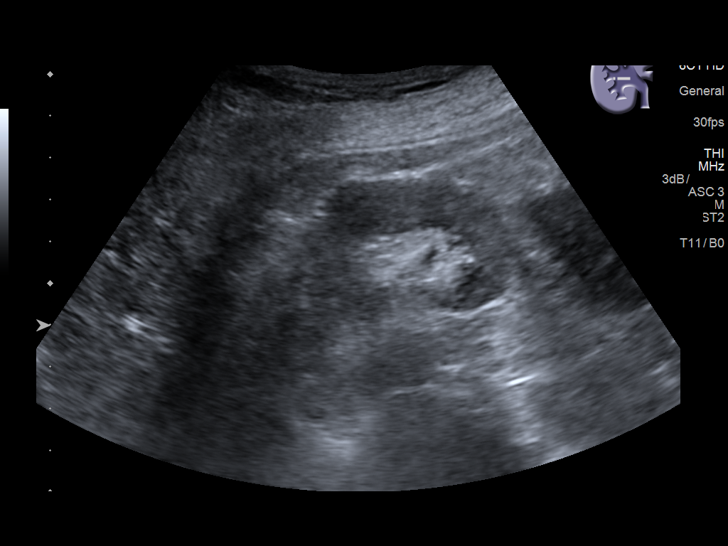
[im 15/32]
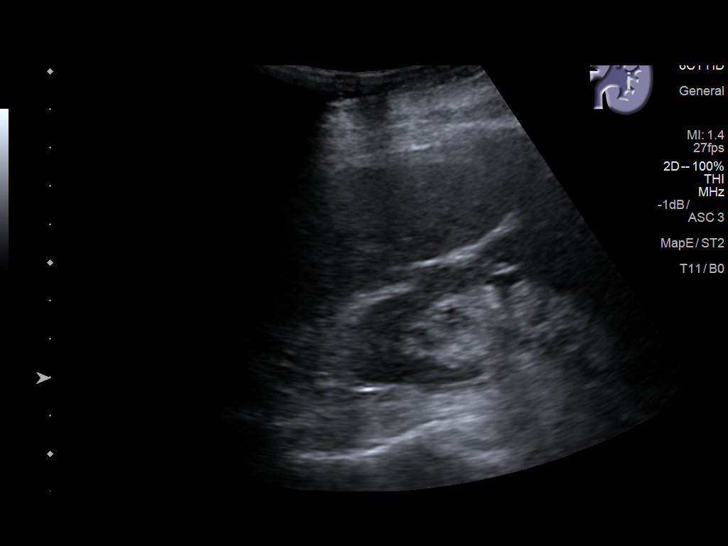
[im 17/32]
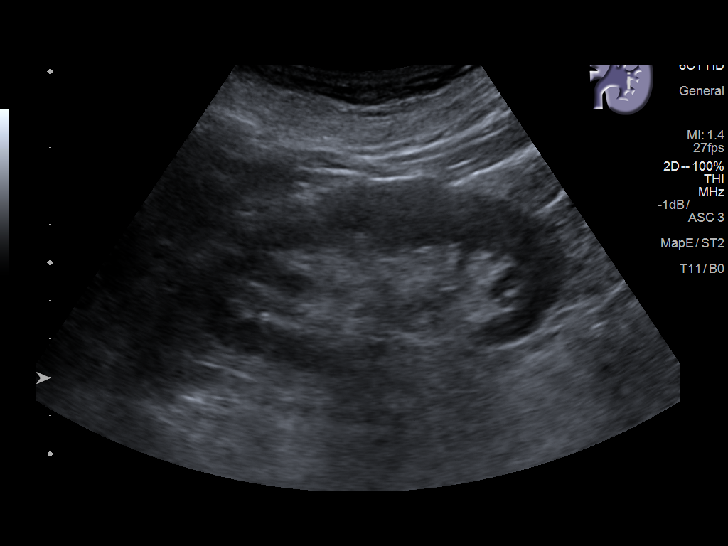
[im 20/32]
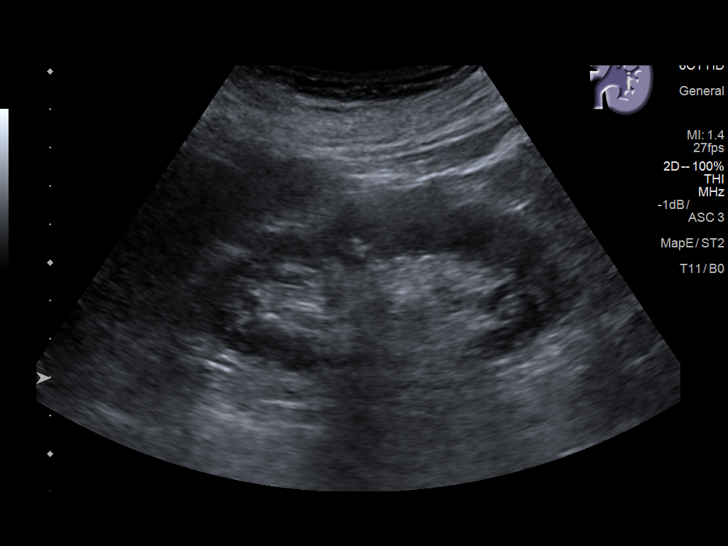
[im 21/32]
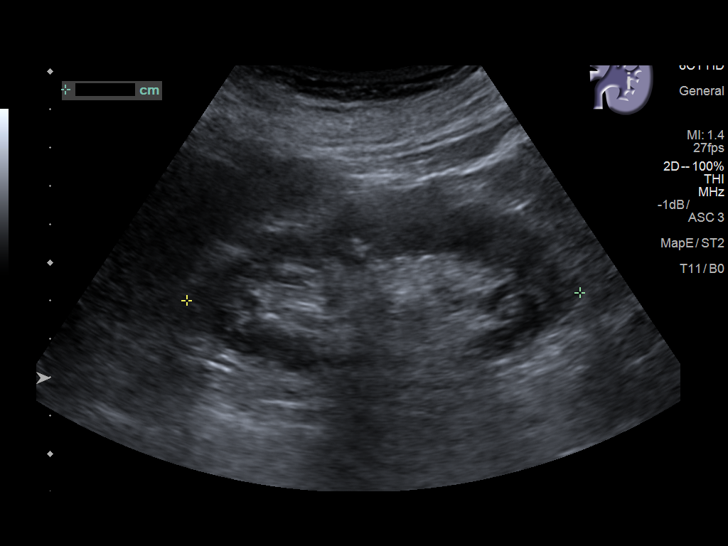
[im 24/32]
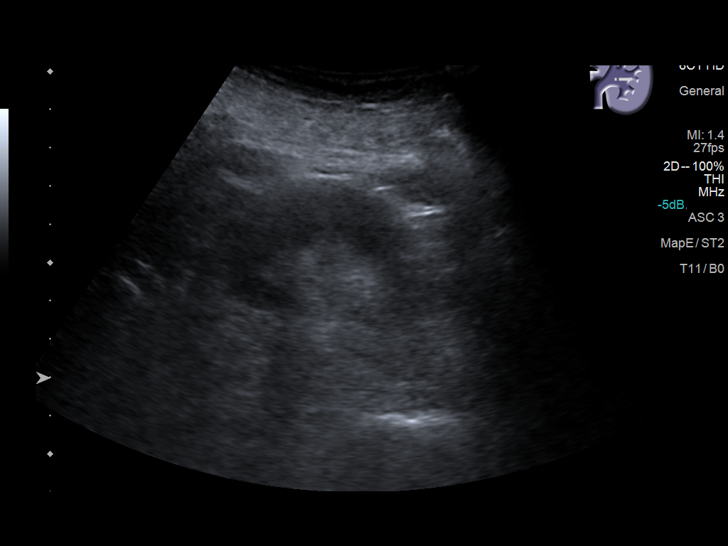
[im 26/32]
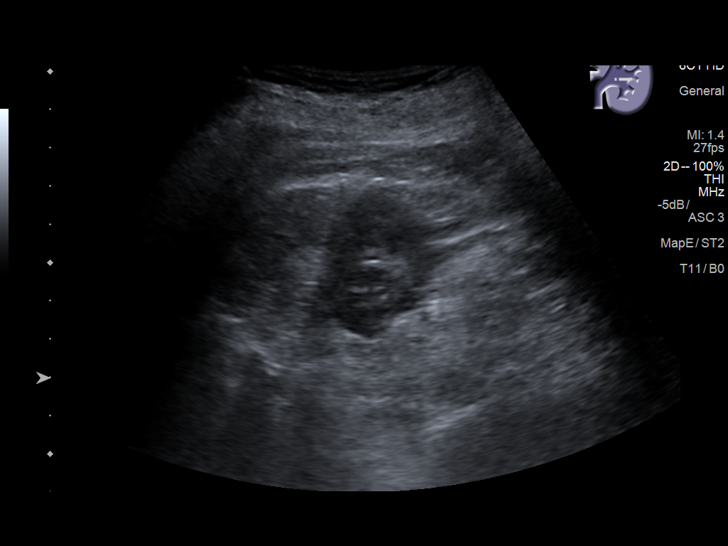
[im 29/32]
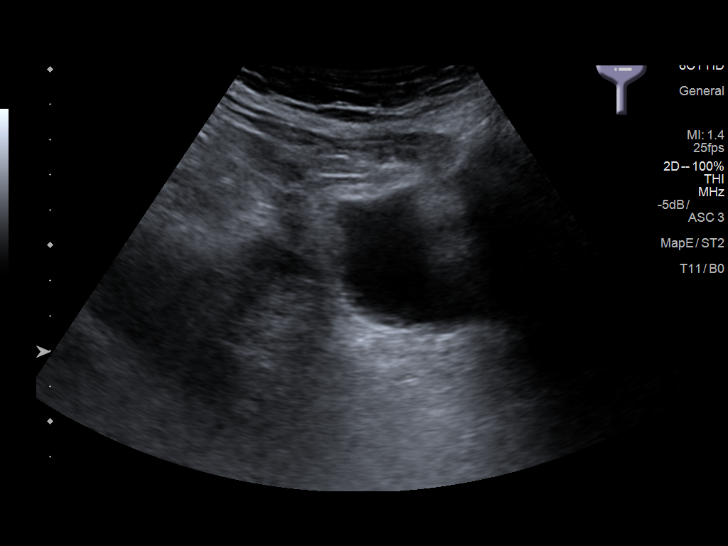
[im 32/32]
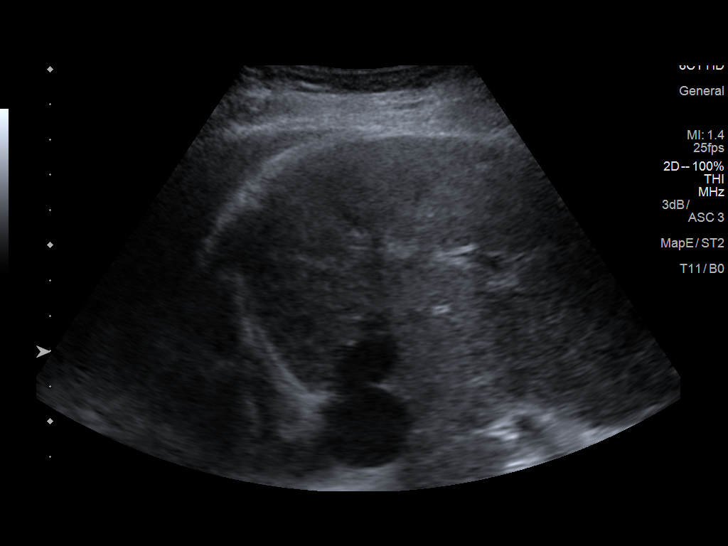

[14 of 25 positions shown; findings below may reference images not displayed]

FINDINGS: Right Kidney:

Length: 10.4 cm. Normal right renal parenchymal thickness and
echogenicity. No right hydronephrosis. No right renal mass
demonstrated. 8 x 5 x 4 mm shadowing nonobstructing stone in the
right lower kidney. Incidentally noted right pleural effusion.

Left Kidney:

Length: 10.3 cm. Echogenicity within normal limits. No mass or
hydronephrosis visualized. No left renal stones large enough to
cause acoustic shadowing. Incidentally noted left pleural effusion.

Bladder:

Appears normal for degree of bladder distention.
IMPRESSION: 1. Nonobstructing right renal stone. Otherwise normal kidneys, with
no hydronephrosis.
2. Normal bladder.
3. Incidental bilateral pleural effusions.

## 2018-06-23 ENCOUNTER — Ambulatory Visit: Payer: Medicare HMO | Admitting: Cardiology

## 2018-07-15 ENCOUNTER — Encounter: Payer: Self-pay | Admitting: Family Medicine

## 2018-07-15 ENCOUNTER — Ambulatory Visit (INDEPENDENT_AMBULATORY_CARE_PROVIDER_SITE_OTHER): Payer: Medicare HMO | Admitting: Family Medicine

## 2018-07-15 VITALS — BP 138/83 | HR 99 | Ht 61.0 in | Wt 137.0 lb

## 2018-07-15 DIAGNOSIS — I10 Essential (primary) hypertension: Secondary | ICD-10-CM | POA: Diagnosis not present

## 2018-07-15 DIAGNOSIS — I4891 Unspecified atrial fibrillation: Secondary | ICD-10-CM

## 2018-07-15 DIAGNOSIS — L659 Nonscarring hair loss, unspecified: Secondary | ICD-10-CM

## 2018-07-15 DIAGNOSIS — E032 Hypothyroidism due to medicaments and other exogenous substances: Secondary | ICD-10-CM | POA: Diagnosis not present

## 2018-07-15 DIAGNOSIS — Z6826 Body mass index (BMI) 26.0-26.9, adult: Secondary | ICD-10-CM | POA: Diagnosis not present

## 2018-07-15 DIAGNOSIS — Z780 Asymptomatic menopausal state: Secondary | ICD-10-CM

## 2018-07-15 NOTE — Progress Notes (Signed)
Subjective:    CC: check thyroid  HPI:  Hypothyroidism - Taking medication regularly in the AM away from food and vitamins, etc. No recent change to skin, hair, or energy levels. She is currently on  Armour thyroid.  Currently taking 30 mg.   Hypertension- Pt denies chest pain, SOB, dizziness, or heart palpitations.  Taking meds as directed w/o problems.  Denies medication side effects.    She is working on trying ot lose more weight.  She really like to get down to about 130 pounds.   A-Fibrillation-she wanted to let me know she is taking her Eliquis twice a day for quite some time she had only been taking it once a day but says that she has been compliant with it.  Past medical history, Surgical history, Family history not pertinant except as noted below, Social history, Allergies, and medications have been entered into the medical record, reviewed, and corrections made.   Review of Systems: No fevers, chills, night sweats, weight loss, chest pain, or shortness of breath.   Objective:    General: Well Developed, well nourished, and in no acute distress.  Neuro: Alert and oriented x3, extra-ocular muscles intact, sensation grossly intact.  HEENT: Normocephalic, atraumatic, no thyromegaly.   Skin: Warm and dry, no rashes. Cardiac: Regular rate and rhythm, no murmurs rubs or gallops, no lower extremity edema.  Respiratory: Clear to auscultation bilaterally. Not using accessory muscles, speaking in full sentences.   Impression and Recommendations:    Hypothyroid - will check TSH today and plan to change to Nature-Throid.  She is hopeful that this will help with her hair loss.  Certainly is not unreasonable to try she understands that insurance will not cover the cost.  And that we will need to recheck her labs in 6 weeks.  HTN - Well controlled. Continue current regimen. Follow up in  3 months.    BMI 26 - she wants to get down to 130 lbs. continue to work on healthy diet and  exercise.  She has made some good changes.  Hair loss -she is hopeful that changing her thyroid medication around will help with some of her hair loss.  Atrial Fibrillation-on Eliquis twice daily.  She says that Dr. Jens Som has recommended that she have a heart catheterization in fact she was supposed to go in December but her part with her insurance would be $200 and since she has been feeling well she has been putting it off.  I encouraged her to please go ahead and try to schedule and try to save up the money.  I do think it is important that she have it done.

## 2018-07-15 NOTE — Patient Instructions (Signed)
We will need to recheck your thyroid in 6 weeks after you  Start the new medication.

## 2018-07-16 ENCOUNTER — Other Ambulatory Visit: Payer: Self-pay | Admitting: Family Medicine

## 2018-07-16 ENCOUNTER — Other Ambulatory Visit: Payer: Self-pay

## 2018-07-16 DIAGNOSIS — E032 Hypothyroidism due to medicaments and other exogenous substances: Secondary | ICD-10-CM

## 2018-07-16 LAB — BASIC METABOLIC PANEL WITH GFR
BUN/Creatinine Ratio: 15 (calc) (ref 6–22)
BUN: 16 mg/dL (ref 7–25)
CO2: 29 mmol/L (ref 20–32)
Calcium: 9.8 mg/dL (ref 8.6–10.4)
Chloride: 103 mmol/L (ref 98–110)
Creat: 1.05 mg/dL — ABNORMAL HIGH (ref 0.60–0.93)
GFR, Est African American: 59 mL/min/{1.73_m2} — ABNORMAL LOW (ref >=60)
GFR, Est Non African American: 51 mL/min/{1.73_m2} — ABNORMAL LOW (ref >=60)
Glucose, Bld: 83 mg/dL (ref 65–99)
Potassium: 3.5 mmol/L (ref 3.5–5.3)
Sodium: 142 mmol/L (ref 135–146)

## 2018-07-16 LAB — TSH: TSH: 9.57 mIU/L — ABNORMAL HIGH (ref 0.40–4.50)

## 2018-07-16 MED ORDER — THYROID 48.75 MG PO TABS: 1.0000 | ORAL_TABLET | Freq: Every day | ORAL | 1 refills | Status: DC

## 2018-07-16 NOTE — Progress Notes (Signed)
Changed to Nature-Throid

## 2018-07-16 NOTE — Telephone Encounter (Signed)
Pt requesting 90 day supply.Marland KitchenMarland KitchenHeath Keith, CMA

## 2018-07-16 NOTE — Telephone Encounter (Signed)
Just changed her dose and will likely end up needing to change it again after her 6-week check.  So no we are not going to fill a 90-day supply.

## 2018-07-25 ENCOUNTER — Other Ambulatory Visit: Payer: Self-pay | Admitting: Family Medicine

## 2018-08-11 ENCOUNTER — Other Ambulatory Visit: Payer: Self-pay

## 2018-08-11 MED ORDER — FUROSEMIDE 40 MG PO TABS: 40.0000 mg | ORAL_TABLET | Freq: Every day | ORAL | 3 refills | Status: DC | PRN

## 2018-08-25 ENCOUNTER — Other Ambulatory Visit: Payer: Self-pay

## 2018-08-25 DIAGNOSIS — E032 Hypothyroidism due to medicaments and other exogenous substances: Secondary | ICD-10-CM

## 2018-08-26 DIAGNOSIS — E032 Hypothyroidism due to medicaments and other exogenous substances: Secondary | ICD-10-CM | POA: Diagnosis not present

## 2018-08-27 LAB — T4, FREE: Free T4: 1 ng/dL (ref 0.8–1.8)

## 2018-08-27 LAB — TSH: TSH: 7.77 mIU/L — ABNORMAL HIGH (ref 0.40–4.50)

## 2018-08-30 ENCOUNTER — Other Ambulatory Visit: Payer: Self-pay | Admitting: Family Medicine

## 2018-08-30 MED ORDER — THYROID 65 MG PO TABS: 65.0000 mg | ORAL_TABLET | Freq: Every day | ORAL | 1 refills | Status: DC

## 2018-08-31 ENCOUNTER — Other Ambulatory Visit: Payer: Self-pay | Admitting: *Deleted

## 2018-08-31 DIAGNOSIS — E038 Other specified hypothyroidism: Secondary | ICD-10-CM

## 2018-09-05 ENCOUNTER — Other Ambulatory Visit: Payer: Self-pay | Admitting: Family Medicine

## 2018-09-08 ENCOUNTER — Other Ambulatory Visit: Payer: Self-pay | Admitting: Family Medicine

## 2018-09-18 ENCOUNTER — Other Ambulatory Visit: Payer: Self-pay | Admitting: Family Medicine

## 2018-09-18 DIAGNOSIS — I4891 Unspecified atrial fibrillation: Secondary | ICD-10-CM

## 2018-09-20 ENCOUNTER — Other Ambulatory Visit: Payer: Self-pay | Admitting: Family Medicine

## 2018-09-21 ENCOUNTER — Other Ambulatory Visit: Payer: Self-pay | Admitting: Family Medicine

## 2018-09-21 NOTE — Progress Notes (Signed)
Called anc canceld rx for Nature 48.7

## 2018-09-21 NOTE — Telephone Encounter (Signed)
Ok to change to 90 day supply

## 2018-10-12 ENCOUNTER — Other Ambulatory Visit: Payer: Self-pay

## 2018-10-12 DIAGNOSIS — E038 Other specified hypothyroidism: Secondary | ICD-10-CM

## 2018-10-14 ENCOUNTER — Other Ambulatory Visit: Payer: Self-pay | Admitting: Family Medicine

## 2018-10-14 DIAGNOSIS — E038 Other specified hypothyroidism: Secondary | ICD-10-CM | POA: Diagnosis not present

## 2018-10-15 ENCOUNTER — Other Ambulatory Visit: Payer: Self-pay | Admitting: Physician Assistant

## 2018-10-15 LAB — TSH: TSH: 3.69 mIU/L (ref 0.40–4.50)

## 2018-10-15 MED ORDER — THYROID 65 MG PO TABS: ORAL_TABLET | ORAL | 0 refills | Status: DC

## 2018-10-15 NOTE — Progress Notes (Signed)
Call pt: normal thyroid range. 3 months sent to pharmacy. Recheck labs in 3 months.

## 2018-11-03 ENCOUNTER — Telehealth: Payer: Self-pay | Admitting: *Deleted

## 2018-11-03 NOTE — Telephone Encounter (Signed)
A message was left, re: follow up visit. 

## 2018-11-15 NOTE — Progress Notes (Signed)
ERROR

## 2018-11-17 ENCOUNTER — Other Ambulatory Visit: Payer: Self-pay

## 2018-11-17 ENCOUNTER — Other Ambulatory Visit: Payer: Self-pay | Admitting: Family Medicine

## 2018-11-17 ENCOUNTER — Telehealth: Payer: Self-pay | Admitting: Family Medicine

## 2018-11-17 ENCOUNTER — Telehealth: Payer: Medicare HMO | Admitting: Cardiology

## 2018-11-17 NOTE — Telephone Encounter (Signed)
Received fax from [employer] that Diazepam requires a PA. Information has been sent to the insurance company. Awaiting determination.

## 2018-11-18 NOTE — Telephone Encounter (Signed)
Received fax from [employer] that Diazepam was approved from 12/3.0/2019 through 05/12/2019. Pharmacy notified and forms sent to scan.

## 2018-12-14 ENCOUNTER — Other Ambulatory Visit: Payer: Self-pay | Admitting: Family Medicine

## 2019-01-10 ENCOUNTER — Other Ambulatory Visit: Payer: Self-pay | Admitting: Physician Assistant

## 2019-01-11 ENCOUNTER — Ambulatory Visit (INDEPENDENT_AMBULATORY_CARE_PROVIDER_SITE_OTHER): Payer: Medicare HMO | Admitting: Family Medicine

## 2019-01-11 ENCOUNTER — Encounter: Payer: Self-pay | Admitting: Family Medicine

## 2019-01-11 ENCOUNTER — Other Ambulatory Visit: Payer: Self-pay

## 2019-01-11 VITALS — BP 139/84 | HR 94 | Ht 61.0 in | Wt 138.0 lb

## 2019-01-11 DIAGNOSIS — N183 Chronic kidney disease, stage 3 unspecified: Secondary | ICD-10-CM

## 2019-01-11 DIAGNOSIS — I10 Essential (primary) hypertension: Secondary | ICD-10-CM | POA: Diagnosis not present

## 2019-01-11 DIAGNOSIS — R809 Proteinuria, unspecified: Secondary | ICD-10-CM

## 2019-01-11 DIAGNOSIS — E038 Other specified hypothyroidism: Secondary | ICD-10-CM

## 2019-01-11 DIAGNOSIS — Z79890 Hormone replacement therapy (postmenopausal): Secondary | ICD-10-CM

## 2019-01-11 DIAGNOSIS — I4891 Unspecified atrial fibrillation: Secondary | ICD-10-CM

## 2019-01-11 LAB — POCT UA - MICROALBUMIN
Albumin/Creatinine Ratio, Urine, POC: >300
Creatinine, POC: 100 mg/dL
Microalbumin Ur, POC: 150 mg/L

## 2019-01-11 NOTE — Assessment & Plan Note (Signed)
Well controlled. Continue current regimen. Follow up in  4 months.   

## 2019-01-11 NOTE — Assessment & Plan Note (Signed)
Still taking her estradiol cream at night.

## 2019-01-11 NOTE — Progress Notes (Signed)
Established Patient Office Visit  Subjective:  Patient ID: Rachel Keith, female    DOB: 1941-05-11  Age: 78 y.o. MRN: [id number]  CC:  Chief Complaint  Patient presents with  . Hypothyroidism    HPI Rachel Doffinglaine Fero presents for   Hypothyroidism - Taking medication regularly in the AM away from food and vitamins, etc. she actually feels like her energy levels are really good she is like the Nature-Throid.  But feels feel like she has started to lose hair again.  Hypertension- Pt denies chest pain, SOB, dizziness, or heart palpitations.  Taking meds as directed w/o problems.  Denies medication side effects.   F/U CKD 3 -no recent changes.  She has been under some more stress recently though.  Her dog of 12 years was recently diagnosed as being blind.  But she has been trying to cope with that.  Atrial fibrillation-she is doing well.  She did let me know though that she is only taking her Eliquis once a day because of increased bruising and feels like that has helped.  But she also takes fish oil once a day.  Past Medical History:  Diagnosis Date  . Anemia   . Anxiety   . Atrial fibrillation with RVR (HCC)   . Blood in the urine   . Chronic kidney disease (CKD), stage III (moderate) (HCC)    [name]/notes 05/28/2015  . High cholesterol   . History of blood transfusion 08/2015   "fell and split my head open; got 2 units"  . Hypertension   . Hypothyroidism   . Nephrolithiasis     Past Surgical History:  Procedure Laterality Date  . ABDOMINAL HYSTERECTOMY    . ANKLE FRACTURE SURGERY Left ~ 2006  . APPENDECTOMY    . CARDIOVERSION  2017   [name]/notes 07/26/2015  . CATARACT EXTRACTION W/ INTRAOCULAR LENS  IMPLANT, BILATERAL Bilateral 09/06/2010  . CYSTOSCOPY W/ STONE MANIPULATION  06/2015 X 2  . FINGER SURGERY Right    "had bone growing out on the side of little finger"  . FRACTURE SURGERY    . LAPAROSCOPIC CHOLECYSTECTOMY    . TEE WITHOUT CARDIOVERSION N/A 10/04/2015   Procedure:  TRANSESOPHAGEAL ECHOCARDIOGRAM (TEE);  Surgeon: Thurmon FairMihai Croitoru, MD;  [facility];  Service: Cardiovascular;  Laterality: N/A;  . TONSILLECTOMY      Family History  Problem Relation Age of Onset  . Alcohol abuse Mother   . Liver disease Mother   . Alcohol abuse Father     Social History   Socioeconomic History  . Marital status: Divorced    Spouse name: Not on file  . Number of children: 4  . Years of education: Not on file  . Highest education level: Not on file  Occupational History  . Occupation: retired  Engineer, productionocial Needs  . Financial resource strain: Not on file  . Food insecurity    Worry: Not on file    Inability: Not on file  . Transportation needs    Medical: Not on file    Non-medical: Not on file  Tobacco Use  . Smoking status: Never Smoker  . Smokeless tobacco: Never Used  Substance and Sexual Activity  . Alcohol use: Yes    Alcohol/week: 0.0 standard drinks    Comment: 09/13/2015 "might have 1 drink/year"  . Drug use: No  . Sexual activity: Never  Lifestyle  . Physical activity    Days per week: Not on file    Minutes per session: Not on file  .  Stress: Not on file  Relationships  . Social Herbalist on phone: Not on file    Gets together: Not on file    Attends religious service: Not on file    Active member of club or organization: Not on file    Attends meetings of clubs or organizations: Not on file    Relationship status: Not on file  . Intimate partner violence    Fear of current or ex partner: Not on file    Emotionally abused: Not on file    Physically abused: Not on file    Forced sexual activity: Not on file  Other Topics Concern  . Not on file  Social History Narrative   1 caffeinated drink daily     Outpatient Medications Prior to Visit  Medication Sig Dispense Refill  . B Complex Vitamins (B COMPLEX PO) Take 1 tablet by mouth daily.    . Biotin 5 MG TABS Take 1 tablet by mouth daily.    . diazepam (VALIUM) 10 MG  tablet TAKE 1 TABLET (10 MG TOTAL) BY MOUTH DAILY AS NEEDED. FOR ANXIETY 90 tablet 0  . ELIQUIS 5 MG TABS tablet TAKE 1 TABLET (5 MG TOTAL) BY MOUTH 2 (TWO) TIMES DAILY. NAME BRAND ONLY 180 tablet 0  . furosemide (LASIX) 40 MG tablet Take 1 tablet (40 mg total) by mouth daily as needed. Keep upcoming appt for future refills ./cy 90 tablet 3  . metoprolol succinate (TOPROL-XL) 50 MG 24 hr tablet TAKE 1 TABLET BY MOUTH EVERY DAY 90 tablet 1  . Omega-3 Fatty Acids (FISH OIL) 1000 MG CAPS Take 3 capsules by mouth daily.     . Selenium 200 MCG TABS Take 1 tablet by mouth daily.    Marland Kitchen thyroid (NATURE-THROID) 65 MG tablet TAKE 1 TABLET (65 MG TOTAL) BY MOUTH DAILY. NEEDS LABS 30 tablet 0  . tretinoin (RETIN-A) 0.025 % cream Apply topically at bedtime as needed. 45 g 1  . TURMERIC CURCUMIN PO Take 1 tablet by mouth daily.     No facility-administered medications prior to visit.     Allergies  Allergen Reactions  . Amlodipine Other (See Comments)    Didn't feel well on 2.5 mg dose.  Didn't feel well on 2.5 mg dose.   Marland Kitchen Lisinopril Other (See Comments)    Hair Loss  . Sulfa Antibiotics Rash    Broke out in her joints one time as a teenager  . Sulfacetamide Sodium Rash    Broke out in her joints one time as a teenager    ROS Review of Systems    Objective:    Physical Exam  Constitutional: She is oriented to person, place, and time. She appears well-developed and well-nourished.  HENT:  Head: Normocephalic and atraumatic.  Neck: Neck supple. No thyromegaly present.  Cardiovascular: Normal rate, regular rhythm and normal heart sounds.  Pulmonary/Chest: Effort normal and breath sounds normal.  Lymphadenopathy:    She has no cervical adenopathy.  Neurological: She is alert and oriented to person, place, and time.  Skin: Skin is warm and dry.  Psychiatric: She has a normal mood and affect. Her behavior is normal.    BP 139/84   Pulse 94   Ht 5\' 1"  (1.549 m)   Wt 138 lb (62.6 kg)    SpO2 100%   BMI 26.07 kg/m  Wt Readings from Last 3 Encounters:  01/11/19 138 lb (62.6 kg)  07/15/18 137 lb (62.1 kg)  04/14/18  141 lb 12.8 oz (64.3 kg)     Health Maintenance Due  Topic Date Due  . DEXA SCAN  12/22/2004    There are no preventive care reminders to display for this patient.  Lab Results  Component Value Date   TSH 1.64 01/11/2019   Lab Results  Component Value Date   WBC 7.5 01/11/2019   HGB 14.8 01/11/2019   HCT 44.4 01/11/2019   MCV 95.1 01/11/2019   PLT 124 (L) 01/11/2019   Lab Results  Component Value Date   NA 141 01/11/2019   K 3.5 01/11/2019   CO2 27 01/11/2019   GLUCOSE 85 01/11/2019   BUN 12 01/11/2019   CREATININE 0.91 01/11/2019   BILITOT 1.0 01/11/2019   ALKPHOS 64 09/14/2015   AST 20 01/11/2019   ALT 12 01/11/2019   PROT 6.9 01/11/2019   ALBUMIN 2.7 (L) 09/15/2015   CALCIUM 9.8 01/11/2019   ANIONGAP 9 09/16/2015   Lab Results  Component Value Date   CHOL 164 01/11/2019   Lab Results  Component Value Date   HDL 46 (L) 01/11/2019   Lab Results  Component Value Date   LDLCALC 91 01/11/2019   Lab Results  Component Value Date   TRIG 175 (H) 01/11/2019   Lab Results  Component Value Date   CHOLHDL 3.6 01/11/2019   No results found for: HGBA1C    Assessment & Plan:   Problem List Items Addressed This Visit      Cardiovascular and Mediastinum   Essential hypertension, benign    Well controlled. Continue current regimen. Follow up in  4 months.       Relevant Orders   TSH (Completed)   COMPLETE METABOLIC PANEL WITH GFR (Completed)   Lipid panel (Completed)   CBC (Completed)   Atrial fibrillation with RVR (HCC)    We discussed the importance of taking her Eliquis twice a day instead of once a day more than happy to try switching her to a different agent if she would like.  She says for now she will just try going back up on the Eliquis.  I did let her know that fish oil can also thin the blood as well.       Relevant Orders   TSH (Completed)   COMPLETE METABOLIC PANEL WITH GFR (Completed)   Lipid panel (Completed)   CBC (Completed)     Endocrine   Hypothyroidism - Primary    Due to recheck TSH.  I really like to get her numbers closer between 1 and 2 with all possible but otherwise seems like she is doing well.  The hair loss has been on and off issue for years it may not be directly related to the thyroid.      Relevant Orders   TSH (Completed)   COMPLETE METABOLIC PANEL WITH GFR (Completed)   Lipid panel (Completed)   CBC (Completed)     Genitourinary   CKD (chronic kidney disease) stage 3, GFR 30-59 ml/min (HCC)    Recheck renal function.  Also recommend doing a urine microalbumin.      Relevant Orders   TSH (Completed)   COMPLETE METABOLIC PANEL WITH GFR (Completed)   Lipid panel (Completed)   CBC (Completed)   POCT UA - Microalbumin (Completed)     Other   Hormone replacement therapy (HRT)    Still taking her estradiol cream at night.         No orders of the defined types were placed in  this encounter.   Follow-up: Return in about 4 months (around 05/13/2019) for thyroid and afib.   Declines flu Vaccine today.   Nani Gasser, MD

## 2019-01-11 NOTE — Assessment & Plan Note (Signed)
Due to recheck TSH.  I really like to get her numbers closer between 1 and 2 with all possible but otherwise seems like she is doing well.  The hair loss has been on and off issue for years it may not be directly related to the thyroid.

## 2019-01-11 NOTE — Assessment & Plan Note (Signed)
We discussed the importance of taking her Eliquis twice a day instead of once a day more than happy to try switching her to a different agent if she would like.  She says for now she will just try going back up on the Eliquis.  I did let her know that fish oil can also thin the blood as well.

## 2019-01-11 NOTE — Assessment & Plan Note (Signed)
Recheck renal function.  Also recommend doing a urine microalbumin.

## 2019-01-12 LAB — COMPLETE METABOLIC PANEL WITH GFR
AG Ratio: 1.5 (calc) (ref 1.0–2.5)
ALT: 12 U/L (ref 6–29)
AST: 20 U/L (ref 10–35)
Albumin: 4.1 g/dL (ref 3.6–5.1)
Alkaline phosphatase (APISO): 70 U/L (ref 37–153)
BUN: 12 mg/dL (ref 7–25)
CO2: 27 mmol/L (ref 20–32)
Calcium: 9.8 mg/dL (ref 8.6–10.4)
Chloride: 104 mmol/L (ref 98–110)
Creat: 0.91 mg/dL (ref 0.60–0.93)
GFR, Est African American: 70 mL/min/{1.73_m2} (ref >=60)
GFR, Est Non African American: 60 mL/min/{1.73_m2} (ref >=60)
Globulin: 2.8 g/dL (calc) (ref 1.9–3.7)
Glucose, Bld: 85 mg/dL (ref 65–99)
Potassium: 3.5 mmol/L (ref 3.5–5.3)
Sodium: 141 mmol/L (ref 135–146)
Total Bilirubin: 1 mg/dL (ref 0.2–1.2)
Total Protein: 6.9 g/dL (ref 6.1–8.1)

## 2019-01-12 LAB — LIPID PANEL
Cholesterol: 164 mg/dL (ref <200)
HDL: 46 mg/dL — ABNORMAL LOW (ref >=50)
LDL Cholesterol (Calc): 91 mg/dL (calc)
Non-HDL Cholesterol (Calc): 118 mg/dL (calc) (ref <130)
Total CHOL/HDL Ratio: 3.6 (calc) (ref <5.0)
Triglycerides: 175 mg/dL — ABNORMAL HIGH (ref <150)

## 2019-01-12 LAB — CBC
HCT: 44.4 % (ref 35.0–45.0)
Hemoglobin: 14.8 g/dL (ref 11.7–15.5)
MCH: 31.7 pg (ref 27.0–33.0)
MCHC: 33.3 g/dL (ref 32.0–36.0)
MCV: 95.1 fL (ref 80.0–100.0)
MPV: 11.2 fL (ref 7.5–12.5)
Platelets: 124 10*3/uL — ABNORMAL LOW (ref 140–400)
RBC: 4.67 10*6/uL (ref 3.80–5.10)
RDW: 13.1 % (ref 11.0–15.0)
WBC: 7.5 10*3/uL (ref 3.8–10.8)

## 2019-01-12 LAB — TSH: TSH: 1.64 mIU/L (ref 0.40–4.50)

## 2019-01-19 DIAGNOSIS — R809 Proteinuria, unspecified: Secondary | ICD-10-CM | POA: Diagnosis not present

## 2019-01-19 DIAGNOSIS — I10 Essential (primary) hypertension: Secondary | ICD-10-CM | POA: Diagnosis not present

## 2019-01-19 DIAGNOSIS — N183 Chronic kidney disease, stage 3 (moderate): Secondary | ICD-10-CM | POA: Diagnosis not present

## 2019-01-19 NOTE — Addendum Note (Signed)
Addended by: Beatrice Lecher D on: 01/19/2019 07:58 AM   Modules accepted: Orders

## 2019-01-21 ENCOUNTER — Telehealth: Payer: Self-pay

## 2019-01-21 LAB — MICROALBUMIN / CREATININE URINE RATIO
Creatinine, Urine: 160 mg/dL (ref 20–275)
Microalb Creat Ratio: 78 mcg/mg creat — ABNORMAL HIGH (ref <30)
Microalb, Ur: 12.4 mg/dL

## 2019-01-21 NOTE — Telephone Encounter (Addendum)
[facility] called and states they may not have the urine to run the urine protein electrophoresis. They will call back after they speak with the [facility] lab in the building.   [facility] called back and states they didn't receive the correct collection cup for the urine protein electrophoresis.

## 2019-01-24 NOTE — Telephone Encounter (Signed)
Ok, please call pt. She will have to resubmit urine.

## 2019-01-24 NOTE — Telephone Encounter (Signed)
Pt advised. [facility] will got [facility] recollection in a couple weeks.

## 2019-01-25 ENCOUNTER — Telehealth: Payer: Self-pay

## 2019-01-25 ENCOUNTER — Other Ambulatory Visit: Payer: Self-pay | Admitting: Physician Assistant

## 2019-01-25 DIAGNOSIS — E038 Other specified hypothyroidism: Secondary | ICD-10-CM

## 2019-01-25 NOTE — Telephone Encounter (Signed)
Patient states that she was told that there was a recall on her thyroid medication because it was not very potent and wants to change. She feels that this is the reason that her hair is falling out. I advised her that her last TSH was normal and only certain lot #s are recalled.. she states she still wants to change... please advise

## 2019-01-26 MED ORDER — SYNTHROID 112 MCG PO TABS: 112.0000 ug | ORAL_TABLET | Freq: Every day | ORAL | 1 refills | Status: DC

## 2019-01-26 NOTE — Telephone Encounter (Signed)
Patient calls again. States that she wants to change to name brand Synthroid and wants the highest dose possible.   She is tired of starting  medications at low doses and having to taper up... she states she is tired of having to come into office and have blood work monitoring levels.  I have discussed with patient that monitoring with blood work is part of it, but she was not happy

## 2019-01-26 NOTE — Telephone Encounter (Signed)
Sorry to say she will be disappointed.  This unfortunately has to be monitored to make sure that we are giving her the appropriate dose.  We are basically supplementing what her body does not take.  We cannot just switch her to the highest strength possible.  In fact that could be very dangerous and cause heart arrhythmias.  So we will convert what she is currently taking which we know works really well and had a great lab result with 2 the equivalent in the branded Synthroid.  That would be approximately 112 mcg daily.  New prescription sent to the pharmacy she can recheck her labs in 8 weeks.

## 2019-01-27 ENCOUNTER — Telehealth: Payer: Self-pay | Admitting: Family Medicine

## 2019-01-27 NOTE — Telephone Encounter (Signed)
See other note. I have sent these papers to be scanned to pt's chart

## 2019-01-27 NOTE — Telephone Encounter (Signed)
Rachel Keith came into the office and dropped off paperwork concerning a recall on one of her medications. She only has 1 left and wants to make sure her new medication will be name brand.  Nature Throid 65mg   Paperwork dropped in Dr. Madilyn Fireman basket.

## 2019-01-27 NOTE — Telephone Encounter (Signed)
Called and left pt msg concerning providers recommendations.   I have entered future lab orders for 8 week check

## 2019-02-02 NOTE — Telephone Encounter (Signed)
Pt was updated regarding provider's recommendation. Pt was agreeable with plan. She will make an appt to f/u with provider as needed.

## 2019-02-02 NOTE — Telephone Encounter (Signed)
Please encourage her to continue the medication and really give it a try.

## 2019-02-02 NOTE — Telephone Encounter (Signed)
Rachel Keith called and states she is feeling very tired on the new medication Synthroid. She started taking it on Friday and she reports having no energy and sleeping all the time.

## 2019-02-07 ENCOUNTER — Telehealth: Payer: Self-pay | Admitting: Family Medicine

## 2019-02-07 NOTE — Progress Notes (Signed)
HPI: FU MR; seenpreviously for CHF and atrial fibrillation. Monitor 3/17 showed afib with rates 110-150 (performed at [facility]). 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 Chest xray 5/17 showed bilateral pleural effusions. She apparently had attempt at [place]EE guided cardio[place] in [place]. She reverted immediately back to atrial fibrillation. I do not have those records available. Patient had transesophageal echocardiogram [facility] May 2017 that showed normal LV systolic function, mild to moderate aortic insufficiency, moderate mitral regurgitation, moderate left atrial and right atrial enlargement and mild right ventricular enlargement; moderate to severe tricuspid regurgitation.LV function improved with rate control of atrial fibrillation.  Last echocardiogram October 2019 showed ejection fraction 50 to 55%, mild aortic regurgitation, moderate to severe mitral regurgitation, severe biatrial enlargement, moderate tricuspid regurgitation and moderate pulmonary hypertension. At last office visit December 2019 we recommended transesophageal echocardiogram, right and left cardiac catheterization and then consideration of referral to cardiothoracic surgery for mitral valve repair.  Patient canceled her procedures at that time.  Telehealth visit had been arranged for July and patient canceled.  Since last seen,there is no dyspnea, chest pain, palpitations or syncope.  No bleeding.  Current Outpatient Medications  Medication Sig Dispense Refill  . B Complex Vitamins (B COMPLEX PO) Take 1 tablet by mouth daily.    . Biotin 5 MG TABS Take 1 tablet by mouth daily.    . diazepam (VALIUM) 10 MG tablet TAKE 1 TABLET (10 MG TOTAL) BY MOUTH DAILY AS NEEDED. FOR ANXIETY 90 tablet 0  . ELIQUIS 5 MG TABS tablet TAKE 1 TABLET (5 MG TOTAL) BY MOUTH 2 (TWO) TIMES DAILY. NAME BRAND ONLY  180 tablet 0  . furosemide (LASIX) 40 MG tablet Take 1 tablet (40 mg total) by mouth daily as needed. Keep upcoming appt for future refills ./cy 90 tablet 3  . metoprolol succinate (TOPROL-XL) 50 MG 24 hr tablet TAKE 1 TABLET BY MOUTH EVERY DAY 90 tablet 1  . Omega-3 Fatty Acids (FISH OIL) 1000 MG CAPS Take 3 capsules by mouth daily.     Marland Kitchen. OVER THE COUNTER MEDICATION Armour Thyroid 60 mg    . Selenium 200 MCG TABS Take 1 tablet by mouth daily.    Marland Kitchen. tretinoin (RETIN-A) 0.025 % cream Apply topically at bedtime as needed. 45 g 1  . TURMERIC CURCUMIN PO Take 1 tablet by mouth daily.     No current facility-administered medications for this visit.      Past Medical History:  Diagnosis Date  . Anemia   . Anxiety   . Atrial fibrillation with RVR (HCC)   . Blood in the urine   . Chronic kidney disease (CKD), stage III (moderate) (HCC)    [name]/notes 05/28/2015  . High cholesterol   . History of blood transfusion 08/2015   "fell and split my head open; got 2 units"  . Hypertension   . Hypothyroidism   . Nephrolithiasis     Past Surgical History:  Procedure Laterality Date  . ABDOMINAL HYSTERECTOMY    . ANKLE FRACTURE SURGERY Left ~ 2006  . APPENDECTOMY    . CARDIOVERSION  2017   [name]/notes 07/26/2015  . CATARACT EXTRACTION W/ INTRAOCULAR LENS  IMPLANT, BILATERAL Bilateral 09/06/2010  . CYSTOSCOPY W/ STONE MANIPULATION  06/2015 X 2  . FINGER SURGERY Right    "had bone growing out on the side of little finger"  . FRACTURE SURGERY    .  LAPAROSCOPIC CHOLECYSTECTOMY    . TEE WITHOUT CARDIOVERSION N/A 10/04/2015   Procedure: TRANSESOPHAGEAL ECHOCARDIOGRAM (TEE);  Surgeon: Sanda Klein, MD;  Location: [facility] ENDOSCOPY;  Service: Cardiovascular;  Laterality: N/A;  . TONSILLECTOMY      Social History   Socioeconomic History  . Marital status: Divorced    Spouse name: Not on file  . Number of children: 4  . Years of education: Not on file  . Highest education level: Not on file  Occupational  History  . Occupation: retired  Scientific laboratory technician  . Financial resource strain: Not on file  . Food insecurity    Worry: Not on file    Inability: Not on file  . Transportation needs    Medical: Not on file    Non-medical: Not on file  Tobacco Use  . Smoking status: Never Smoker  . Smokeless tobacco: Never Used  Substance and Sexual Activity  . Alcohol use: Yes    Alcohol/week: 0.0 standard drinks    Comment: 09/13/2015 "might have 1 drink/year"  . Drug use: No  . Sexual activity: Never  Lifestyle  . Physical activity    Days per week: Not on file    Minutes per session: Not on file  . Stress: Not on file  Relationships  . Social Herbalist on phone: Not on file    Gets together: Not on file    Attends religious service: Not on file    Active member of club or organization: Not on file    Attends meetings of clubs or organizations: Not on file    Relationship status: Not on file  . Intimate partner violence    Fear of current or ex partner: Not on file    Emotionally abused: Not on file    Physically abused: Not on file    Forced sexual activity: Not on file  Other Topics Concern  . Not on file  Social History Narrative   1 caffeinated drink daily     Family History  Problem Relation Age of Onset  . Alcohol abuse Mother   . Liver disease Mother   . Alcohol abuse Father     ROS: She had some fatigue recently following adjustment of thyroid medications and this is been readjusted.  No fevers or chills, productive cough, hemoptysis, dysphasia, odynophagia, melena, hematochezia, dysuria, hematuria, rash, seizure activity, orthopnea, PND, pedal edema, claudication. Remaining systems are negative.  Physical Exam: Well-developed well-nourished in no acute distress.  Skin is warm and dry.  HEENT is normal.  Neck is supple.  Chest is clear to auscultation with normal expansion.  Cardiovascular exam is irregular Abdominal exam nontender or distended. No masses  palpated. Extremities show no edema. neuro grossly intact  ECG-atrial fibrillation with heart rate of 106, cannot rule out septal infarct.  Personally reviewed  A/P  1 mitral regurgitation-previous echocardiogram showed significant mitral regurgitation and ejection fraction 50 to 55%.  We had recommended transesophageal echocardiogram and possible cardiac catheterization with mitral valve repair at that time.  She canceled these procedures.  She remains asymptomatic today.  We will arrange a repeat echocardiogram.  I reviewed the indications for potential mitral valve surgery including symptoms, left ventricular enlargement and reduced LV function.  It is not clear to me that she would be agreeable to mitral valve surgery in the future but we can consider pending follow-up echo results.  2 permanent atrial fibrillation-continue beta-blocker for rate control but will increase to 75 mg daily as  she is somewhat tachycardic.  Continue apixaban.  3 chronic diastolic congestive heart failure-continue present dose of Lasix.  She denies worsening CHF symptoms at present.  4 hypertension-blood pressure controlled.  Continue present medications and follow.  Olga Millers, MD

## 2019-02-07 NOTE — Telephone Encounter (Signed)
Patient states that she fell and a bunch of luggage fell on her. She said she didn't know what day it was, woke up sunday and was getting ready and thought it was Monday and thought she had to take her daughter to work. States that she is really tired and doesn't know what to do but thinks its due to medication. Did not seek UC after fall. I transferred call to triage nurse.

## 2019-02-07 NOTE — Telephone Encounter (Signed)
[employer] did fall in her closet. She reports no injury. She is confused by what day of the week it is, she is not eating well and over sleeping. She stopped the Synthroid. Her daughter states she has notice a big difference in her mom. She would like a different medication.

## 2019-02-07 NOTE — Telephone Encounter (Signed)
My recommendation would be for her to go back on levothyroxine which is what she took for years until we recently started changing it.  If she is okay with that then please let me know.  Plus if she is not feeling well she really needs an appointment.

## 2019-02-08 ENCOUNTER — Ambulatory Visit: Payer: Medicare HMO | Admitting: Cardiology

## 2019-02-08 MED ORDER — LEVOTHYROXINE SODIUM 112 MCG PO TABS: 112.0000 ug | ORAL_TABLET | Freq: Every day | ORAL | 1 refills | Status: DC

## 2019-02-08 NOTE — Telephone Encounter (Signed)
Rachel Keith agreed to go back on the Levothyroxine. She will call to schedule an appointment if needed.

## 2019-02-08 NOTE — Addendum Note (Signed)
Addended by: Beatrice Lecher D on: 02/08/2019 05:10 PM   Modules accepted: Orders

## 2019-02-08 NOTE — Telephone Encounter (Signed)
rx for levothyroxine scne to pharmacy

## 2019-02-09 ENCOUNTER — Ambulatory Visit (INDEPENDENT_AMBULATORY_CARE_PROVIDER_SITE_OTHER): Payer: Medicare HMO | Admitting: Cardiology

## 2019-02-09 ENCOUNTER — Encounter: Payer: Self-pay | Admitting: Cardiology

## 2019-02-09 ENCOUNTER — Other Ambulatory Visit: Payer: Self-pay

## 2019-02-09 VITALS — BP 126/68 | HR 106 | Ht 61.0 in | Wt 134.0 lb

## 2019-02-09 DIAGNOSIS — I059 Rheumatic mitral valve disease, unspecified: Secondary | ICD-10-CM | POA: Diagnosis not present

## 2019-02-09 DIAGNOSIS — I4891 Unspecified atrial fibrillation: Secondary | ICD-10-CM | POA: Diagnosis not present

## 2019-02-09 DIAGNOSIS — I10 Essential (primary) hypertension: Secondary | ICD-10-CM

## 2019-02-09 MED ORDER — METOPROLOL SUCCINATE ER 25 MG PO TB24: 25.0000 mg | ORAL_TABLET | Freq: Every day | ORAL | 3 refills | Status: DC

## 2019-02-09 MED ORDER — THYROID 65 MG PO TABS: 65.0000 mg | ORAL_TABLET | Freq: Every day | ORAL | 0 refills | Status: DC

## 2019-02-09 NOTE — Telephone Encounter (Signed)
Patient called- states she does not want RX for Levothyroxine.Rachel Keith it is too synthetic.   Requesting RX for Armour Thyroid 65 mg. States she is having [facility] fax some information about how this is better than Levothyroxine.Rachel Keith

## 2019-02-09 NOTE — Patient Instructions (Signed)
Medication Instructions:  INCREASE METOPROLOL TO 75 MG ONCE DAILY= 1 OF THE 50 MG TABLETS AND 1 OF THE 25 MG TABLETS TOGETHER ONCE DAILY  If you need a refill on your cardiac medications before your next appointment, please call your pharmacy.   Lab work: If you have labs (blood work) drawn today and your tests are completely normal, you will receive your results only by: Marland Kitchen MyChart Message (if you have MyChart) OR . A paper copy in the mail If you have any lab test that is abnormal or we need to change your treatment, we will call you to review the results.  Testing/Procedures: Your physician has requested that you have an echocardiogram. Echocardiography is a painless test that uses sound waves to create images of your heart. It provides your doctor with information about the size and shape of your heart and how well your heart's chambers and valves are working. This procedure takes approximately one hour. There are no restrictions for this procedure.[facility] [facility]    Follow-Up: At [facility], you and your health needs are our priority.  As part of our continuing mission to provide you with exceptional heart care, we have created designated Provider Care Teams.  These Care Teams include your primary Cardiologist (physician) and Advanced Practice Providers (APPs -  Physician Assistants and Nurse Practitioners) who all work together to provide you with the care you need, when you need it. Your physician wants you to follow-up in: [place] will receive a reminder letter in the mail two months in advance. If you don't receive a letter, please call our office to schedule the follow-up appointment.

## 2019-02-09 NOTE — Telephone Encounter (Signed)
Ok, new med sent. She has tried this before.

## 2019-02-09 NOTE — Addendum Note (Signed)
Addended by: Beatrice Lecher D on: 02/09/2019 02:18 PM   Modules accepted: Orders

## 2019-02-09 NOTE — Telephone Encounter (Signed)
Pt advised.

## 2019-02-09 NOTE — Telephone Encounter (Signed)
Armour Thyroid is actually not better.  In fact levothyroxine more consistently mimics the balance of thyroid hormone in the body.  But I am more than happy for her to try Armour again.  So we will send over a new prescription to her pharmacy based on her recent numbers.

## 2019-02-11 ENCOUNTER — Telehealth: Payer: Self-pay | Admitting: Cardiology

## 2019-02-11 NOTE — Telephone Encounter (Signed)
Follow Up:   Pt said she saw Dr Stanford Breed yesterday at the office. Her question is, why was her Metoprolol increased?

## 2019-02-11 NOTE — Telephone Encounter (Signed)
Left a message for the patient to call back.   Per the office visit note on 02/09/2019:  permanent atrial fibrillation-continue beta-blocker for rate control but will increase to 75 mg daily as she is somewhat tachycardic.  Continue apixaban.

## 2019-02-15 NOTE — Telephone Encounter (Signed)
LM2CB 

## 2019-02-16 ENCOUNTER — Ambulatory Visit (HOSPITAL_BASED_OUTPATIENT_CLINIC_OR_DEPARTMENT_OTHER): Payer: Medicare HMO

## 2019-02-16 NOTE — Telephone Encounter (Signed)
Spoke with pt, aware heart rate in the office was 106 bpm and that is why the metoprolol was changed. She does report her thyroid medication was changed and she wonders if that is the cause. She has started a different thyroid medication and does feel better now. She will monitor her bp and heart rate and let us know of any concerns.

## 2019-02-17 ENCOUNTER — Telehealth: Payer: Self-pay | Admitting: *Deleted

## 2019-02-17 ENCOUNTER — Telehealth: Payer: Self-pay | Admitting: Cardiology

## 2019-02-17 NOTE — Telephone Encounter (Signed)
Spoke with patient, she was calling regarding Echo needing to be scheduled. I have sent schedule message to scheduling to arrange.

## 2019-02-17 NOTE — Telephone Encounter (Signed)
Left message for patient to call and reschedule Echo at [facility]

## 2019-02-17 NOTE — Telephone Encounter (Signed)
Attempted to contact patient back, rang x8 times, and just hangs up. No voicemail to leave message.  I did not see where anything else was scheduled that would have her put to sleep. I will also route to primary nurse to make aware of this and if she knows anything.  Thank you!

## 2019-02-17 NOTE — Telephone Encounter (Signed)
New Message    Patient wants to know about a procedure Dr. Stanford Breed is suppose to perform on her.  She states it's not the echo but another test where he's suppose to put her to sleep.  Please call patient back to discuss.

## 2019-02-24 ENCOUNTER — Other Ambulatory Visit: Payer: Self-pay

## 2019-02-24 ENCOUNTER — Ambulatory Visit (HOSPITAL_BASED_OUTPATIENT_CLINIC_OR_DEPARTMENT_OTHER)
Admission: RE | Admit: 2019-02-24 | Discharge: 2019-02-24 | Disposition: A | Discharge status: Home / Self Care | Payer: Medicare HMO | Source: Ambulatory Visit | Attending: Cardiology | Admitting: Cardiology

## 2019-02-24 DIAGNOSIS — I059 Rheumatic mitral valve disease, unspecified: Secondary | ICD-10-CM | POA: Diagnosis not present

## 2019-02-24 NOTE — Progress Notes (Signed)
  Echocardiogram 2D Echocardiogram has been performed.  Cardell Peach 02/24/2019, 11:03 AM

## 2019-03-08 ENCOUNTER — Other Ambulatory Visit: Payer: Self-pay | Admitting: Physician Assistant

## 2019-03-08 ENCOUNTER — Telehealth: Payer: Self-pay | Admitting: Family Medicine

## 2019-03-08 MED ORDER — THYROID 65 MG PO TABS: 65.0000 mg | ORAL_TABLET | Freq: Every day | ORAL | 1 refills | Status: DC

## 2019-03-08 MED ORDER — ARMOUR THYROID 90 MG PO TABS: 90.0000 mg | ORAL_TABLET | Freq: Every day | ORAL | 1 refills | Status: DC

## 2019-03-08 NOTE — Telephone Encounter (Signed)
She called before lunch and we had a meeting during lunch so there was not way for me to review her message over lunch.  We can try the 90 mg dose for 6 weeks and then she will need to go for labs and then recheck levels to make sure we don't need to adjust the dose.  She will have to go for labs.

## 2019-03-08 NOTE — Addendum Note (Signed)
Addended by: Annamaria Helling on: 03/08/2019 09:37 AM   Modules accepted: Orders

## 2019-03-08 NOTE — Telephone Encounter (Signed)
Patient is extremely upset about the Armour 65 mg sent to the pharmacy, it was denied due to the dose. She said she needs the 75 mg. They don't make 65 mg (per the pharmacy tech). She says the 60 mg makes her feel terrible and tired. She is almost out of the medication.

## 2019-03-08 NOTE — Telephone Encounter (Signed)
Patient calling back asking about the questions below about the Armour. States that she has no energy and just wants to sleep all the time. She needs the higher dose. She is exteremly upset that no one has called her back. I did let patient know that Dr.Metheney and Miss Kenney Houseman are in clinic and that someone will be getting with her as soon as possible. Did apologize to her and let her know that if she did not hear from someone before the end of the day to call back. Patient is frustrated but I told her to bare with [place] and we will do everything we can to help.

## 2019-03-09 ENCOUNTER — Other Ambulatory Visit: Payer: Self-pay | Admitting: Family Medicine

## 2019-03-09 DIAGNOSIS — I4891 Unspecified atrial fibrillation: Secondary | ICD-10-CM

## 2019-03-09 NOTE — Telephone Encounter (Signed)
Patient takes both 50 mg (Dr. Madilyn Fireman) and 25 mg(Dr. Orpah Melter) total of 75 mg daily.

## 2019-03-09 NOTE — Telephone Encounter (Signed)
Patient advised.

## 2019-03-15 ENCOUNTER — Other Ambulatory Visit: Payer: Self-pay | Admitting: Family Medicine

## 2019-03-15 NOTE — Progress Notes (Signed)
HPI: FU MR; seenpreviously for CHF and atrial fibrillation. Monitor 3/17 showed afib with rates 110-150 (performed at [facility]). 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 Chest xray 5/17 showed bilateral pleural effusions. She apparently had attempt at TEE guided cardioversion in [place]. She reverted immediately back to atrial fibrillation. I do not have those records available. Patient had transesophageal echocardiogram at [facility] May 2017 that showed normal LV systolic function, mild to moderate aortic insufficiency, moderate mitral regurgitation, moderate left atrial and right atrial enlargement and mild right ventricular enlargement; moderate to severe tricuspid regurgitation.LV function improved with rate control of atrial fibrillation.FU echocardiogram October 2019 showed ejection fraction 50 to 55%, mild aorticregurgitation, moderate to severe mitral regurgitation, severe biatrial enlargement, she will need to hold her to hold her Eliquis for 2 days prior to the procedure catheterization and then resume the day after and I will hold her Lasix in the morning the procedure she needs a TEE and moderate tricuspid regurgitation and moderate pulmonary hypertension. At office visit December 2019 we recommended transesophageal echocardiogram, right and left cardiac catheterization and then consideration of referral to cardiothoracic surgery for mitral valve repair.  Patient canceled her procedures at that time.  Telehealth visit had been arranged for July and patient canceled. We saw patient in follow-up February 09, 2019.  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  Since last seen,she  occasionally has dyspnea when climbing stairs unchanged.  She denies orthopnea, PND, increased pedal edema, chest pain or syncope.  No bleeding.  Current Outpatient Medications  Medication Sig Dispense Refill   ARMOUR THYROID 90 MG tablet Take 1 tablet (90 mg total) by mouth daily. 30 tablet 1   B Complex Vitamins (B COMPLEX PO) Take 1 tablet by mouth daily.     Biotin 5 MG TABS Take 1 tablet by mouth daily.     diazepam (VALIUM) 10 MG tablet TAKE 1 TABLET (10 MG TOTAL) BY MOUTH DAILY AS NEEDED. FOR ANXIETY 90 tablet 0   ELIQUIS 5 MG TABS tablet TAKE 1 TABLET (5 MG TOTAL) BY MOUTH 2 (TWO) TIMES DAILY. NAME BRAND ONLY 180 tablet 0   furosemide (LASIX) 40 MG tablet Take 1 tablet (40 mg total) by mouth daily as needed. Keep upcoming appt for future refills ./cy 90 tablet 3   metoprolol succinate (TOPROL XL) 25 MG 24 hr tablet Take 1 tablet (25 mg total) by mouth daily. 90 tablet 3   metoprolol succinate (TOPROL-XL) 50 MG 24 hr tablet Take 1 tablet (50 mg total) by mouth daily. To be taken with the 25 mg for total of 75 mg daily. 90 tablet 1   Omega-3 Fatty Acids (FISH OIL) 1000 MG CAPS Take 3 capsules by mouth daily.      OVER THE COUNTER MEDICATION Armour Thyroid 60 mg     Selenium 200 MCG TABS Take 1 tablet by mouth daily.     tretinoin (RETIN-A) 0.025 % cream Apply topically at bedtime as needed. 45 g 1   TURMERIC CURCUMIN PO Take 1 tablet by mouth daily.     No current facility-administered medications for this visit.      Past Medical History:  Diagnosis Date   Anemia    Anxiety    Atrial fibrillation with RVR (HCC)  Blood in the urine    Chronic kidney disease (CKD), stage III (moderate)    Archie Endo 05/28/2015   High cholesterol    History of blood transfusion 08/2015   "fell and split my head open; got 2 units"   Hypertension    Hypothyroidism    Nephrolithiasis     Past Surgical History:  Procedure Laterality Date   ABDOMINAL HYSTERECTOMY     ANKLE  FRACTURE SURGERY Left ~ 2006   APPENDECTOMY     CARDIOVERSION  2017   Archie Endo 07/26/2015   CATARACT EXTRACTION W/ INTRAOCULAR LENS  IMPLANT, BILATERAL Bilateral 09/06/2010   CYSTOSCOPY W/ STONE MANIPULATION  06/2015 X 2   FINGER SURGERY Right    "had bone growing out on the side of little finger"   FRACTURE SURGERY     LAPAROSCOPIC CHOLECYSTECTOMY     TEE WITHOUT CARDIOVERSION N/A 10/04/2015   Procedure: TRANSESOPHAGEAL ECHOCARDIOGRAM (TEE);  Surgeon: Sanda Klein, MD;  Location: [facility];  Service: Cardiovascular;  Laterality: N/A;   TONSILLECTOMY      Social History   Socioeconomic History   Marital status: Divorced    Spouse name: Not on file   Number of children: 4   Years of education: Not on file   Highest education level: Not on file  Occupational History   Occupation: retired  Scientist, product/process development strain: Not on file   Food insecurity    Worry: Not on file    Inability: Not on Lexicographer needs    Medical: Not on file    Non-medical: Not on file  Tobacco Use   Smoking status: Never Smoker   Smokeless tobacco: Never Used  Substance and Sexual Activity   Alcohol use: Yes    Alcohol/week: 0.0 standard drinks    Comment: 09/13/2015 "might have 1 drink/year"   Drug use: No   Sexual activity: Never  Lifestyle   Physical activity    Days per week: Not on file    Minutes per session: Not on file   Stress: Not on file  Relationships   Social connections    Talks on phone: Not on file    Gets together: Not on file    Attends religious service: Not on file    Active member of club or organization: Not on file    Attends meetings of clubs or organizations: Not on file    Relationship status: Not on file   Intimate partner violence    Fear of current or ex partner: Not on file    Emotionally abused: Not on file    Physically abused: Not on file    Forced sexual activity: Not on file  Other Topics Concern   Not on  file  Social History Narrative   1 caffeinated drink daily     Family History  Problem Relation Age of Onset   Alcohol abuse Mother    Liver disease Mother    Alcohol abuse Father     ROS: no fevers or chills, productive cough, hemoptysis, dysphasia, odynophagia, melena, hematochezia, dysuria, hematuria, rash, seizure activity, orthopnea, PND, pedal edema, claudication. Remaining systems are negative.  Physical Exam: Well-developed well-nourished in no acute distress.  Skin is warm and dry.  HEENT is normal.  Neck is supple.  Chest is clear to auscultation with normal expansion.  Cardiovascular exam is irregular, 2/6 systolic murmur apex; 2/6 systolic murmur left sternal border Abdominal exam nontender or distended. No masses palpated. Extremities show no  edema. °neuro grossly intact ° °A/P ° °1 mitral regurgitation-I have personally reviewed the patient's recent transthoracic echocardiogram and agree with interpretation.  Her ejection fraction is mildly reduced and she has severe mitral regurgitation and moderate aortic insufficiency.  Also moderate tricuspid regurgitation.  I have recommended TEE and R and L cardiac catheterization followed by referral for consideration of valve surgery.  The risks and benefits of catheterization including myocardial infarction, CVA and death discussed and she agrees to proceed.  We will arrange these on the same day.  I will then arrange appointment with CVTS for consideration of valve surgery.  Hold Lasix a.m. of catheterization.  Hold apixaban 2 days prior to procedure and resume the day after. ° °2 permanent atrial fibrillation-continue beta-blocker and apixaban. ° °3 hypertension-patient's blood pressure is controlled.  Continue present medications and follow. ° °4 chronic diastolic congestive heart failure-patient appears to be doing reasonably well from a symptomatic standpoint.  Continue present dose of Lasix. ° °Tyner Codner, MD ° ° ° °

## 2019-03-15 NOTE — H&P (View-Only) (Signed)
HPI: FU MR; seenpreviously for CHF and atrial fibrillation. Monitor 3/17 showed afib with rates 110-150 (performed at [facility]). 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 Chest xray 5/17 showed bilateral pleural effusions. She apparently had attempt at TEE guided cardioversion in [place]. She reverted immediately back to atrial fibrillation. I do not have those records available. Patient had transesophageal echocardiogram at [facility] May 2017 that showed normal LV systolic function, mild to moderate aortic insufficiency, moderate mitral regurgitation, moderate left atrial and right atrial enlargement and mild right ventricular enlargement; moderate to severe tricuspid regurgitation.LV function improved with rate control of atrial fibrillation.FU echocardiogram October 2019 showed ejection fraction 50 to 55%, mild aorticregurgitation, moderate to severe mitral regurgitation, severe biatrial enlargement, she will need to hold her to hold her Eliquis for 2 days prior to the procedure catheterization and then resume the day after and I will hold her Lasix in the morning the procedure she needs a TEE and moderate tricuspid regurgitation and moderate pulmonary hypertension. At office visit December 2019 we recommended transesophageal echocardiogram, right and left cardiac catheterization and then consideration of referral to cardiothoracic surgery for mitral valve repair.  Patient canceled her procedures at that time.  Telehealth visit had been arranged for July and patient canceled. We saw patient in follow-up February 09, 2019.  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  Since last seen,she  occasionally has dyspnea when climbing stairs unchanged.  She denies orthopnea, PND, increased pedal edema, chest pain or syncope.  No bleeding.  Current Outpatient Medications  Medication Sig Dispense Refill   ARMOUR THYROID 90 MG tablet Take 1 tablet (90 mg total) by mouth daily. 30 tablet 1   B Complex Vitamins (B COMPLEX PO) Take 1 tablet by mouth daily.     Biotin 5 MG TABS Take 1 tablet by mouth daily.     diazepam (VALIUM) 10 MG tablet TAKE 1 TABLET (10 MG TOTAL) BY MOUTH DAILY AS NEEDED. FOR ANXIETY 90 tablet 0   ELIQUIS 5 MG TABS tablet TAKE 1 TABLET (5 MG TOTAL) BY MOUTH 2 (TWO) TIMES DAILY. NAME BRAND ONLY 180 tablet 0   furosemide (LASIX) 40 MG tablet Take 1 tablet (40 mg total) by mouth daily as needed. Keep upcoming appt for future refills ./cy 90 tablet 3   metoprolol succinate (TOPROL XL) 25 MG 24 hr tablet Take 1 tablet (25 mg total) by mouth daily. 90 tablet 3   metoprolol succinate (TOPROL-XL) 50 MG 24 hr tablet Take 1 tablet (50 mg total) by mouth daily. To be taken with the 25 mg for total of 75 mg daily. 90 tablet 1   Omega-3 Fatty Acids (FISH OIL) 1000 MG CAPS Take 3 capsules by mouth daily.      OVER THE COUNTER MEDICATION Armour Thyroid 60 mg     Selenium 200 MCG TABS Take 1 tablet by mouth daily.     tretinoin (RETIN-A) 0.025 % cream Apply topically at bedtime as needed. 45 g 1   TURMERIC CURCUMIN PO Take 1 tablet by mouth daily.     No current facility-administered medications for this visit.      Past Medical History:  Diagnosis Date   Anemia    Anxiety    Atrial fibrillation with RVR (HCC)  Blood in the urine    Chronic kidney disease (CKD), stage III (moderate)    Archie Endo 05/28/2015   High cholesterol    History of blood transfusion 08/2015   "fell and split my head open; got 2 units"   Hypertension    Hypothyroidism    Nephrolithiasis     Past Surgical History:  Procedure Laterality Date   ABDOMINAL HYSTERECTOMY     ANKLE  FRACTURE SURGERY Left ~ 2006   APPENDECTOMY     CARDIOVERSION  2017   Archie Endo 07/26/2015   CATARACT EXTRACTION W/ INTRAOCULAR LENS  IMPLANT, BILATERAL Bilateral 09/06/2010   CYSTOSCOPY W/ STONE MANIPULATION  06/2015 X 2   FINGER SURGERY Right    "had bone growing out on the side of little finger"   FRACTURE SURGERY     LAPAROSCOPIC CHOLECYSTECTOMY     TEE WITHOUT CARDIOVERSION N/A 10/04/2015   Procedure: TRANSESOPHAGEAL ECHOCARDIOGRAM (TEE);  Surgeon: Sanda Klein, MD;  Location: [facility];  Service: Cardiovascular;  Laterality: N/A;   TONSILLECTOMY      Social History   Socioeconomic History   Marital status: Divorced    Spouse name: Not on file   Number of children: 4   Years of education: Not on file   Highest education level: Not on file  Occupational History   Occupation: retired  Scientist, product/process development strain: Not on file   Food insecurity    Worry: Not on file    Inability: Not on Lexicographer needs    Medical: Not on file    Non-medical: Not on file  Tobacco Use   Smoking status: Never Smoker   Smokeless tobacco: Never Used  Substance and Sexual Activity   Alcohol use: Yes    Alcohol/week: 0.0 standard drinks    Comment: 09/13/2015 "might have 1 drink/year"   Drug use: No   Sexual activity: Never  Lifestyle   Physical activity    Days per week: Not on file    Minutes per session: Not on file   Stress: Not on file  Relationships   Social connections    Talks on phone: Not on file    Gets together: Not on file    Attends religious service: Not on file    Active member of club or organization: Not on file    Attends meetings of clubs or organizations: Not on file    Relationship status: Not on file   Intimate partner violence    Fear of current or ex partner: Not on file    Emotionally abused: Not on file    Physically abused: Not on file    Forced sexual activity: Not on file  Other Topics Concern   Not on  file  Social History Narrative   1 caffeinated drink daily     Family History  Problem Relation Age of Onset   Alcohol abuse Mother    Liver disease Mother    Alcohol abuse Father     ROS: no fevers or chills, productive cough, hemoptysis, dysphasia, odynophagia, melena, hematochezia, dysuria, hematuria, rash, seizure activity, orthopnea, PND, pedal edema, claudication. Remaining systems are negative.  Physical Exam: Well-developed well-nourished in no acute distress.  Skin is warm and dry.  HEENT is normal.  Neck is supple.  Chest is clear to auscultation with normal expansion.  Cardiovascular exam is irregular, 2/6 systolic murmur apex; 2/6 systolic murmur left sternal border Abdominal exam nontender or distended. No masses palpated. Extremities show no  edema. neuro grossly intact  A/P  1 mitral regurgitation-I have personally reviewed the patient's recent transthoracic echocardiogram and agree with interpretation.  Her ejection fraction is mildly reduced and she has severe mitral regurgitation and moderate aortic insufficiency.  Also moderate tricuspid regurgitation.  I have recommended TEE and R and L cardiac catheterization followed by referral for consideration of valve surgery.  The risks and benefits of catheterization including myocardial infarction, CVA and death discussed and she agrees to proceed.  We will arrange these on the same day.  I will then arrange appointment with CVTS for consideration of valve surgery.  Hold Lasix a.m. of catheterization.  Hold apixaban 2 days prior to procedure and resume the day after.  2 permanent atrial fibrillation-continue beta-blocker and apixaban.  3 hypertension-patient's blood pressure is controlled.  Continue present medications and follow.  4 chronic diastolic congestive heart failure-patient appears to be doing reasonably well from a symptomatic standpoint.  Continue present dose of Lasix.  Olga MillersBrian Kongmeng Santoro, MD

## 2019-03-16 ENCOUNTER — Other Ambulatory Visit: Payer: Self-pay

## 2019-03-16 ENCOUNTER — Encounter: Payer: Self-pay | Admitting: Cardiology

## 2019-03-16 ENCOUNTER — Other Ambulatory Visit: Payer: Self-pay | Admitting: *Deleted

## 2019-03-16 ENCOUNTER — Ambulatory Visit (INDEPENDENT_AMBULATORY_CARE_PROVIDER_SITE_OTHER): Payer: Medicare HMO | Admitting: Cardiology

## 2019-03-16 VITALS — BP 110/70 | HR 102 | Ht 61.5 in | Wt 133.0 lb

## 2019-03-16 DIAGNOSIS — I10 Essential (primary) hypertension: Secondary | ICD-10-CM

## 2019-03-16 DIAGNOSIS — I4891 Unspecified atrial fibrillation: Secondary | ICD-10-CM | POA: Diagnosis not present

## 2019-03-16 DIAGNOSIS — I340 Nonrheumatic mitral (valve) insufficiency: Secondary | ICD-10-CM | POA: Diagnosis not present

## 2019-03-16 DIAGNOSIS — I059 Rheumatic mitral valve disease, unspecified: Secondary | ICD-10-CM

## 2019-03-16 DIAGNOSIS — I359 Nonrheumatic aortic valve disorder, unspecified: Secondary | ICD-10-CM

## 2019-03-16 MED ORDER — SODIUM CHLORIDE 0.9% FLUSH: 3.0000 mL | Freq: Two times a day (BID) | INTRAVENOUS | Status: DC

## 2019-03-16 NOTE — Patient Instructions (Signed)
You are scheduled for a TEE on 03-23-2019 with Dr. Stanford Breed.  Please arrive at the [facility] (Main Entrance A) at [facility]: [address] [place], [place] [address] at 9:30 am. (1 hour prior to procedure unless lab work is needed; if lab work is needed arrive 1.5 hours ahead)  DIET: Nothing to eat or drink after midnight except a sip of water with medications (see medication instructions below)  Medication Instructions: DO NOT TAKE FUROSEMIDE THE MORNING OF THE PROCEDURE  Continue your anticoagulant: DO NOT TAKE ELIQUIS Monday-11/9, Tuesday-11/10 OR WEDNESDAY 11/11 RESTART Thursday 11-12  You must have a responsible person to drive you home and stay in the waiting area during your procedure. Failure to do so could result in cancellation.  Bring your insurance cards.  *Special Note: Every effort is made to have your procedure done on time. Occasionally there are emergencies that occur at the hospital that may cause delays. Please be patient if a delay does occur.         [address] [place] [address] Dept: [phone] Loc: [place]  03/16/2019  You are scheduled for a Cardiac Catheterization on Wednesday, November 11 with Dr. Lauree Chandler.  1. Please arrive at the [facility] (Main Entrance A) at [facility]: [address] [place], [place] [address] at 9:30 AM (This time is two hours before your procedure to ensure your preparation). Free valet parking service is available.   Special note: Every effort is made to have your procedure done on time. Please understand that emergencies sometimes delay scheduled procedures.  2. Diet: Do not eat solid foods after midnight.  The patient may have clear liquids until 5am upon the day of the procedure.  3. Labs: You will need to have blood drawn TODAY  GO TO [place] Saturday 03-19-2019 @ 12 NOON FOR COVID TESTING-GET IN THE PRE-SURGICAL LINE-DO NOT GO TO A TENT  4. Medication instructions in preparation for your procedure:  On the morning of your procedure, take any morning medicines NOT listed above.  You may use sips of water.  5. Plan for one night stay--bring personal belongings. 6. Bring a current list of your medications and current insurance cards. 7. You MUST have a responsible person to drive you home. 8. Someone MUST be with you the first 24 hours after you arrive home or your discharge will be delayed. 9. Please wear clothes that are easy to get on and off and wear slip-on shoes.  Thank you for allowing [place] to care for you!   -- [facility]

## 2019-03-17 LAB — BASIC METABOLIC PANEL
BUN/Creatinine Ratio: 16 (ref 12–28)
BUN: 16 mg/dL (ref 8–27)
CO2: 23 mmol/L (ref 20–29)
Calcium: 9.9 mg/dL (ref 8.7–10.3)
Chloride: 102 mmol/L (ref 96–106)
Creatinine, Ser: 0.99 mg/dL (ref 0.57–1.00)
GFR calc Af Amer: 63 mL/min/{1.73_m2} (ref >59)
GFR calc non Af Amer: 54 mL/min/{1.73_m2} — ABNORMAL LOW (ref >59)
Glucose: 79 mg/dL (ref 65–99)
Potassium: 3.6 mmol/L (ref 3.5–5.2)
Sodium: 142 mmol/L (ref 134–144)

## 2019-03-17 LAB — CBC
Hematocrit: 43.5 % (ref 34.0–46.6)
Hemoglobin: 14.6 g/dL (ref 11.1–15.9)
MCH: 30.7 pg (ref 26.6–33.0)
MCHC: 33.6 g/dL (ref 31.5–35.7)
MCV: 91 fL (ref 79–97)
Platelets: 169 10*3/uL (ref 150–450)
RBC: 4.76 x10E6/uL (ref 3.77–5.28)
RDW: 12.9 % (ref 11.7–15.4)
WBC: 10.2 10*3/uL (ref 3.4–10.8)

## 2019-03-18 ENCOUNTER — Encounter: Payer: Self-pay | Admitting: *Deleted

## 2019-03-19 ENCOUNTER — Other Ambulatory Visit (HOSPITAL_COMMUNITY)
Admission: RE | Admit: 2019-03-19 | Discharge: 2019-03-19 | Disposition: A | Discharge status: Home / Self Care | Payer: Medicare HMO | Source: Ambulatory Visit | Attending: Cardiology | Admitting: Cardiology

## 2019-03-19 DIAGNOSIS — Z20828 Contact with and (suspected) exposure to other viral communicable diseases: Secondary | ICD-10-CM | POA: Diagnosis not present

## 2019-03-19 DIAGNOSIS — Z01812 Encounter for preprocedural laboratory examination: Secondary | ICD-10-CM | POA: Diagnosis not present

## 2019-03-20 ENCOUNTER — Other Ambulatory Visit: Payer: Self-pay | Admitting: Family Medicine

## 2019-03-20 LAB — NOVEL CORONAVIRUS, NAA (HOSP ORDER, SEND-OUT TO REF LAB; TAT 18-24 HRS): SARS-CoV-2, NAA: NOT DETECTED

## 2019-03-22 ENCOUNTER — Telehealth: Payer: Self-pay | Admitting: *Deleted

## 2019-03-22 NOTE — Telephone Encounter (Signed)
Pt contacted pre-catheterization scheduled at [facility] for: Wednesday March 23, 2019  1PM /TEE 10:30 AM Verified arrival time and place: [place] [facility]) at: 9:30 AM  Nothing to eat or drink after midnight prior to procedure.  Contrast allergy: No  Hold: Eliquis-none 03/21/19 until post procedure. Lasix-AM of procedure/already taken today-GFR 54  Except hold medications AM meds can be  taken pre-cath with sip of water including: ASA 81 mg   Confirmed patient has responsible adult to drive home post procedure and observe 24 hours after arriving home: yes  Currently, due to Covid-19 pandemic, only one support person will be allowed with patient. Must be the same support person for that patient's entire stay, will be screened and required to wear a mask. They will be asked to wait in the waiting room for the duration of the patient's stay.  Patients are required to wear a mask when they enter the hospital.      COVID-19 Pre-Screening Questions:  . In the past 7 to 10 days have you had a cough,  shortness of breath, headache, congestion, fever (100 or greater) body aches, chills, sore throat, or sudden loss of taste or sense of smell? no . Have you been around anyone with known Covid 19? no . Have you been around anyone who is awaiting Covid 19 test results in the past 7 to 10 days? no . Have you been around anyone who has been exposed to Covid 19, or has mentioned symptoms of Covid 19 within the past 7 to 10 days? no   I reviewed procedure/mask/visitor instructions, Covid-19 screening questions with patient, he verbalized understanding, thanked me for call.

## 2019-03-23 ENCOUNTER — Ambulatory Visit (HOSPITAL_COMMUNITY)
Admission: RE | Disposition: A | Discharge status: Home / Self Care | Payer: Self-pay | Source: Home / Self Care | Attending: Cardiology

## 2019-03-23 ENCOUNTER — Encounter (HOSPITAL_COMMUNITY): Payer: Self-pay

## 2019-03-23 ENCOUNTER — Ambulatory Visit (HOSPITAL_BASED_OUTPATIENT_CLINIC_OR_DEPARTMENT_OTHER)
Admission: RE | Admit: 2019-03-23 | Discharge: 2019-03-23 | Disposition: A | Discharge status: Home / Self Care | Payer: Medicare HMO | Source: Home / Self Care | Attending: Cardiology | Admitting: Cardiology

## 2019-03-23 ENCOUNTER — Other Ambulatory Visit: Payer: Self-pay

## 2019-03-23 ENCOUNTER — Encounter (HOSPITAL_COMMUNITY)
Admission: RE | Disposition: A | Discharge status: Home / Self Care | Payer: Self-pay | Source: Home / Self Care | Attending: Cardiology

## 2019-03-23 ENCOUNTER — Ambulatory Visit (HOSPITAL_COMMUNITY)
Admission: RE | Admit: 2019-03-23 | Discharge: 2019-03-23 | Disposition: A | Discharge status: Home / Self Care | Payer: Medicare HMO | Attending: Cardiology | Admitting: Cardiology

## 2019-03-23 DIAGNOSIS — I130 Hypertensive heart and chronic kidney disease with heart failure and stage 1 through stage 4 chronic kidney disease, or unspecified chronic kidney disease: Secondary | ICD-10-CM | POA: Diagnosis not present

## 2019-03-23 DIAGNOSIS — I5032 Chronic diastolic (congestive) heart failure: Secondary | ICD-10-CM | POA: Insufficient documentation

## 2019-03-23 DIAGNOSIS — Z79899 Other long term (current) drug therapy: Secondary | ICD-10-CM | POA: Diagnosis not present

## 2019-03-23 DIAGNOSIS — I083 Combined rheumatic disorders of mitral, aortic and tricuspid valves: Secondary | ICD-10-CM | POA: Diagnosis not present

## 2019-03-23 DIAGNOSIS — E039 Hypothyroidism, unspecified: Secondary | ICD-10-CM | POA: Diagnosis not present

## 2019-03-23 DIAGNOSIS — E7800 Pure hypercholesterolemia, unspecified: Secondary | ICD-10-CM | POA: Diagnosis not present

## 2019-03-23 DIAGNOSIS — I4821 Permanent atrial fibrillation: Secondary | ICD-10-CM | POA: Diagnosis not present

## 2019-03-23 DIAGNOSIS — I059 Rheumatic mitral valve disease, unspecified: Secondary | ICD-10-CM

## 2019-03-23 DIAGNOSIS — I272 Pulmonary hypertension, unspecified: Secondary | ICD-10-CM | POA: Insufficient documentation

## 2019-03-23 DIAGNOSIS — Z7901 Long term (current) use of anticoagulants: Secondary | ICD-10-CM | POA: Diagnosis not present

## 2019-03-23 DIAGNOSIS — I340 Nonrheumatic mitral (valve) insufficiency: Secondary | ICD-10-CM

## 2019-03-23 DIAGNOSIS — N183 Chronic kidney disease, stage 3 unspecified: Secondary | ICD-10-CM | POA: Diagnosis not present

## 2019-03-23 DIAGNOSIS — I359 Nonrheumatic aortic valve disorder, unspecified: Secondary | ICD-10-CM

## 2019-03-23 HISTORY — PX: TEE WITHOUT CARDIOVERSION: SHX5443

## 2019-03-23 HISTORY — PX: RIGHT/LEFT HEART CATH AND CORONARY ANGIOGRAPHY: CATH118266

## 2019-03-23 LAB — POCT I-STAT EG7
Acid-base deficit: 2 mmol/L (ref 0.0–2.0)
Bicarbonate: 23.9 mmol/L (ref 20.0–28.0)
Calcium, Ion: 1.13 mmol/L — ABNORMAL LOW (ref 1.15–1.40)
HCT: 35 % — ABNORMAL LOW (ref 36.0–46.0)
Hemoglobin: 11.9 g/dL — ABNORMAL LOW (ref 12.0–15.0)
O2 Saturation: 64 %
Potassium: 3.1 mmol/L — ABNORMAL LOW (ref 3.5–5.1)
Sodium: 145 mmol/L (ref 135–145)
TCO2: 25 mmol/L (ref 22–32)
pCO2, Ven: 46.9 mmHg (ref 44.0–60.0)
pH, Ven: 7.316 (ref 7.250–7.430)
pO2, Ven: 36 mmHg (ref 32.0–45.0)

## 2019-03-23 LAB — POCT I-STAT 7, (LYTES, BLD GAS, ICA,H+H)
Acid-base deficit: 2 mmol/L (ref 0.0–2.0)
Bicarbonate: 25.5 mmol/L (ref 20.0–28.0)
Calcium, Ion: 1.2 mmol/L (ref 1.15–1.40)
HCT: 35 % — ABNORMAL LOW (ref 36.0–46.0)
Hemoglobin: 11.9 g/dL — ABNORMAL LOW (ref 12.0–15.0)
O2 Saturation: 98 %
Potassium: 3.2 mmol/L — ABNORMAL LOW (ref 3.5–5.1)
Sodium: 145 mmol/L (ref 135–145)
TCO2: 27 mmol/L (ref 22–32)
pCO2 arterial: 54.7 mmHg — ABNORMAL HIGH (ref 32.0–48.0)
pH, Arterial: 7.276 — ABNORMAL LOW (ref 7.350–7.450)
pO2, Arterial: 122 mmHg — ABNORMAL HIGH (ref 83.0–108.0)

## 2019-03-23 SURGERY — RIGHT/LEFT HEART CATH AND CORONARY ANGIOGRAPHY
Anesthesia: LOCAL

## 2019-03-23 SURGERY — ECHOCARDIOGRAM, TRANSESOPHAGEAL
Anesthesia: Moderate Sedation

## 2019-03-23 MED ORDER — SODIUM CHLORIDE 0.9 % WEIGHT BASED INFUSION
1.0000 mL/kg/h | INTRAVENOUS | Status: DC
  Administered 2019-03-23: 1 mL/kg/h via INTRAVENOUS

## 2019-03-23 MED ORDER — HEPARIN (PORCINE) IN NACL 1000-0.9 UT/500ML-% IV SOLN
INTRAVENOUS | Status: AC
  Filled 2019-03-23: qty 1000

## 2019-03-23 MED ORDER — HEPARIN (PORCINE) IN NACL 1000-0.9 UT/500ML-% IV SOLN
INTRAVENOUS | Status: DC | PRN
  Administered 2019-03-23: 500 mL

## 2019-03-23 MED ORDER — SODIUM CHLORIDE 0.9% FLUSH: 3.0000 mL | Freq: Two times a day (BID) | INTRAVENOUS | Status: DC

## 2019-03-23 MED ORDER — FENTANYL CITRATE (PF) 100 MCG/2ML IJ SOLN
INTRAMUSCULAR | Status: AC
  Filled 2019-03-23: qty 2

## 2019-03-23 MED ORDER — MIDAZOLAM HCL (PF) 10 MG/2ML IJ SOLN
INTRAMUSCULAR | Status: DC | PRN
  Administered 2019-03-23 (×2): 2 mg via INTRAVENOUS
  Administered 2019-03-23: 1 mg via INTRAVENOUS
  Administered 2019-03-23: 2 mg via INTRAVENOUS
  Administered 2019-03-23 (×2): 1 mg via INTRAVENOUS

## 2019-03-23 MED ORDER — VERAPAMIL HCL 2.5 MG/ML IV SOLN
INTRAVENOUS | Status: AC
  Filled 2019-03-23: qty 2

## 2019-03-23 MED ORDER — LABETALOL HCL 5 MG/ML IV SOLN: 10.0000 mg | INTRAVENOUS | Status: DC | PRN

## 2019-03-23 MED ORDER — ONDANSETRON HCL 4 MG/2ML IJ SOLN
INTRAMUSCULAR | Status: DC | PRN
  Administered 2019-03-23: 4 mg via INTRAVENOUS

## 2019-03-23 MED ORDER — SODIUM CHLORIDE 0.9% FLUSH: 3.0000 mL | INTRAVENOUS | Status: DC | PRN

## 2019-03-23 MED ORDER — MIDAZOLAM HCL 2 MG/2ML IJ SOLN
INTRAMUSCULAR | Status: DC | PRN
  Administered 2019-03-23 (×2): 2 mg via INTRAVENOUS

## 2019-03-23 MED ORDER — MIDAZOLAM HCL 2 MG/2ML IJ SOLN
INTRAMUSCULAR | Status: AC
  Filled 2019-03-23: qty 2

## 2019-03-23 MED ORDER — SODIUM CHLORIDE 0.9 % IV SOLN: 250.0000 mL | INTRAVENOUS | Status: DC | PRN

## 2019-03-23 MED ORDER — ONDANSETRON HCL 4 MG/2ML IJ SOLN: 4.0000 mg | Freq: Four times a day (QID) | INTRAMUSCULAR | Status: DC | PRN

## 2019-03-23 MED ORDER — BUTAMBEN-TETRACAINE-BENZOCAINE 2-2-14 % EX AERO
INHALATION_SPRAY | CUTANEOUS | Status: DC | PRN
  Administered 2019-03-23: 2 via TOPICAL

## 2019-03-23 MED ORDER — ASPIRIN 81 MG PO CHEW: 81.0000 mg | CHEWABLE_TABLET | ORAL | Status: DC

## 2019-03-23 MED ORDER — IOHEXOL 350 MG/ML SOLN
INTRAVENOUS | Status: DC | PRN
  Administered 2019-03-23: 50 mL via INTRA_ARTERIAL

## 2019-03-23 MED ORDER — ASPIRIN 81 MG PO CHEW
CHEWABLE_TABLET | ORAL | Status: AC
  Filled 2019-03-23: qty 1

## 2019-03-23 MED ORDER — ONDANSETRON HCL 4 MG/2ML IJ SOLN
INTRAMUSCULAR | Status: AC
  Filled 2019-03-23: qty 2

## 2019-03-23 MED ORDER — MIDAZOLAM HCL (PF) 5 MG/ML IJ SOLN
INTRAMUSCULAR | Status: AC
  Filled 2019-03-23: qty 2

## 2019-03-23 MED ORDER — LIDOCAINE HCL (PF) 1 % IJ SOLN
INTRAMUSCULAR | Status: AC
  Filled 2019-03-23: qty 30

## 2019-03-23 MED ORDER — SODIUM CHLORIDE 0.9 % IV SOLN: INTRAVENOUS | Status: AC

## 2019-03-23 MED ORDER — LIDOCAINE HCL (PF) 1 % IJ SOLN
INTRAMUSCULAR | Status: DC | PRN
  Administered 2019-03-23: 20 mL
  Administered 2019-03-23: 2 mL

## 2019-03-23 MED ORDER — FENTANYL CITRATE (PF) 100 MCG/2ML IJ SOLN
INTRAMUSCULAR | Status: DC | PRN
  Administered 2019-03-23 (×4): 25 ug via INTRAVENOUS

## 2019-03-23 MED ORDER — ACETAMINOPHEN 325 MG PO TABS: 650.0000 mg | ORAL_TABLET | ORAL | Status: DC | PRN

## 2019-03-23 MED ORDER — HYDRALAZINE HCL 20 MG/ML IJ SOLN: 10.0000 mg | INTRAMUSCULAR | Status: DC | PRN

## 2019-03-23 MED ORDER — DIPHENHYDRAMINE HCL 50 MG/ML IJ SOLN
INTRAMUSCULAR | Status: AC
  Filled 2019-03-23: qty 1

## 2019-03-23 MED ORDER — FENTANYL CITRATE (PF) 100 MCG/2ML IJ SOLN
INTRAMUSCULAR | Status: DC | PRN
  Administered 2019-03-23 (×2): 50 ug via INTRAVENOUS

## 2019-03-23 MED ORDER — FENTANYL CITRATE (PF) 100 MCG/2ML IJ SOLN
INTRAMUSCULAR | Status: AC
  Filled 2019-03-23: qty 4

## 2019-03-23 MED ORDER — HEPARIN SODIUM (PORCINE) 1000 UNIT/ML IJ SOLN
INTRAMUSCULAR | Status: AC
  Filled 2019-03-23: qty 1

## 2019-03-23 MED ORDER — SODIUM CHLORIDE 0.9 % WEIGHT BASED INFUSION: 3.0000 mL/kg/h | INTRAVENOUS | Status: AC

## 2019-03-23 SURGICAL SUPPLY — 16 items
CATH BALLN WEDGE 5F 110CM (CATHETERS) ×2 IMPLANT
CATH DXT MULTI JL4 JR4 ANG PIG (CATHETERS) ×2 IMPLANT
CATH SWAN GANZ 7F STRAIGHT (CATHETERS) ×2 IMPLANT
GLIDESHEATH SLEND SS 6F .021 (SHEATH) ×2 IMPLANT
GUIDEWIRE .025 260CM (WIRE) ×2 IMPLANT
GUIDEWIRE INQWIRE 1.5J.035X260 (WIRE) ×1 IMPLANT
INQWIRE 1.5J .035X260CM (WIRE) ×2
KIT HEART LEFT (KITS) ×2 IMPLANT
PACK CARDIAC CATHETERIZATION (CUSTOM PROCEDURE TRAY) ×2 IMPLANT
SHEATH GLIDE SLENDER 4/5FR (SHEATH) ×2 IMPLANT
SHEATH PINNACLE 5F 10CM (SHEATH) ×2 IMPLANT
SHEATH PINNACLE 7F 10CM (SHEATH) ×2 IMPLANT
SHEATH PROBE COVER 6X72 (BAG) ×2 IMPLANT
TRANSDUCER W/STOPCOCK (MISCELLANEOUS) ×2 IMPLANT
TUBING CIL FLEX 10 FLL-RA (TUBING) ×2 IMPLANT
WIRE EMERALD 3MM-J .035X150CM (WIRE) ×2 IMPLANT

## 2019-03-23 NOTE — Interval H&P Note (Signed)
History and Physical Interval Note:  03/23/2019 10:04 AM  Rachel Keith  has presented today for surgery, with the diagnosis of aortic and mitral valve disorder.  The various methods of treatment have been discussed with the patient and family. After consideration of risks, benefits and other options for treatment, the patient has consented to  Procedure(s): TRANSESOPHAGEAL ECHOCARDIOGRAM (TEE) (N/A) as a surgical intervention.  The patient's history has been reviewed, patient examined, no change in status, stable for surgery.  I have reviewed the patient's chart and labs.  Questions were answered to the patient's satisfaction.     [employer]

## 2019-03-23 NOTE — CV Procedure (Signed)
   Transesophageal Echocardiogram  Indications: MR/TR  Time out performed  During this procedure the patient is administered a total of Versed 7 mg and Fentanyl 100 mcg to achieve and maintain moderate conscious sedation.  The patient's heart rate, blood pressure, and oxygen saturation are monitored continuously during the procedure. The period of conscious sedation is 25 minutes, of which I was present face-to-face 100% of this time.  Findings:  Left Ventricle: EF 45%, mildly reduced  Mitral Valve: A2 prolapse, with severe mitral regurgitation, mostly central, largest PISA radius 0.8cm. Mild right sided PV flow reversal  Aortic Valve: Nodular tip calcification with mild regurgitation. No stenosis  Tricuspid Valve: severe TR  Left Atrium: Severely dilated, no LAA thrombus  Right atrium: severely dilated  Candee Furbish, MD

## 2019-03-23 NOTE — Progress Notes (Signed)
Site area: rt groin fa and fv sheaths Site Prior to Removal:  Level 0 Pressure Applied For:  20 minutes Manual:   yes Patient Status During Pull:  stable Post Pull Site:  Level 0 Post Pull Instructions Given:  yes Post Pull Pulses Present: rt dp palpable Dressing Applied:  Gauze and tegaderm Bedrest begins @ 1550 Comments:

## 2019-03-23 NOTE — Progress Notes (Signed)
  Echocardiogram Echocardiogram Transesophageal has been performed.  Rachel Keith M 03/23/2019, 11:15 AM

## 2019-03-23 NOTE — Discharge Instructions (Signed)
Resume Eliquis Friday November 13th if no evidence of bleeding in the groin.    Femoral Site Care This sheet gives you information about how to care for yourself after your procedure. Your health care provider may also give you more specific instructions. If you have problems or questions, contact your health care provider. What can I expect after the procedure? After the procedure, it is common to have:  Bruising that usually fades within 1-2 weeks.  Tenderness at the site. Follow these instructions at home: Wound care  Follow instructions from your health care provider about how to take care of your insertion site. Make sure you: ? Wash your hands with soap and water before you change your bandage (dressing). If soap and water are not available, use hand sanitizer. ? Change your dressing as told by your health care provider. ? Leave stitches (sutures), skin glue, or adhesive strips in place. These skin closures may need to stay in place for 2 weeks or longer. If adhesive strip edges start to loosen and curl up, you may trim the loose edges. Do not remove adhesive strips completely unless your health care provider tells you to do that.  Do not take baths, swim, or use a hot tub until your health care provider approves.  You may shower 24-48 hours after the procedure or as told by your health care provider. ? Gently wash the site with plain soap and water. ? Pat the area dry with a clean towel. ? Do not rub the site. This may cause bleeding.  Do not apply powder or lotion to the site. Keep the site clean and dry.  Check your femoral site every day for signs of infection. Check for: ? Redness, swelling, or pain. ? Fluid or blood. ? Warmth. ? Pus or a bad smell. Activity  For the first 2-3 days after your procedure, or as long as directed: ? Avoid climbing stairs as much as possible. ? Do not squat.  Do not lift anything that is heavier than 10 lb (4.5 kg), or the limit that you  are told, until your health care provider says that it is safe.  Rest as directed. ? Avoid sitting for a long time without moving. Get up to take short walks every 1-2 hours.  Do not drive for 24 hours if you were given a medicine to help you relax (sedative). General instructions  Take over-the-counter and prescription medicines only as told by your health care provider.  Keep all follow-up visits as told by your health care provider. This is important. Contact a health care provider if you have:  A fever or chills.  You have redness, swelling, or pain around your insertion site. Get help right away if:  The catheter insertion area swells very fast.  You pass out.  You suddenly start to sweat or your skin gets clammy.  The catheter insertion area is bleeding, and the bleeding does not stop when you hold steady pressure on the area.  The area near or just beyond the catheter insertion site becomes pale, cool, tingly, or numb. These symptoms may represent a serious problem that is an emergency. Do not wait to see if the symptoms will go away. Get medical help right away. Call your local emergency services (911 in the [place] Do not drive yourself to the hospital. Summary  After the procedure, it is common to have bruising that usually fades within 1-2 weeks.  Check your femoral site every day for signs of  infection.  Do not lift anything that is heavier than 10 lb (4.5 kg), or the limit that you are told, until your health care provider says that it is safe. This information is not intended to replace advice given to you by your health care provider. Make sure you discuss any questions you have with your health care provider. Document Released: 12/30/2013 Document Revised: 05/11/2017 Document Reviewed: 05/11/2017 Elsevier Patient Education  2020 [employer].

## 2019-03-23 NOTE — Interval H&P Note (Signed)
History and Physical Interval Note:  03/23/2019 2:13 PM  Rachel Keith  has presented today for surgery, with the diagnosis of arotic valve disease.  The various methods of treatment have been discussed with the patient and family. After consideration of risks, benefits and other options for treatment, the patient has consented to  Procedure(s): RIGHT/LEFT HEART CATH AND CORONARY ANGIOGRAPHY (N/A) as a surgical intervention.  The patient's history has been reviewed, patient examined, no change in status, stable for surgery.  I have reviewed the patient's chart and labs.  Questions were answered to the patient's satisfaction.    Cath Lab Visit (complete for each Cath Lab visit)  Clinical Evaluation Leading to the Procedure:   ACS: No.  Non-ACS:    Anginal Classification: CCS II  Anti-ischemic medical therapy: Minimal Therapy (1 class of medications)  Non-Invasive Test Results: No non-invasive testing performed  Prior CABG: No previous CABG         Lauree Chandler

## 2019-03-23 NOTE — Progress Notes (Signed)
Patient with emesis x1.  Mostly food.  Patient states feeling much better.

## 2019-03-23 NOTE — Progress Notes (Signed)
No bleeding or hematoma noted after ambulation 

## 2019-03-24 ENCOUNTER — Encounter (HOSPITAL_COMMUNITY): Payer: Self-pay | Admitting: Cardiovascular Disease

## 2019-03-24 MED FILL — Verapamil HCl IV Soln 2.5 MG/ML: INTRAVENOUS | Qty: 2 | Status: AC

## 2019-03-25 ENCOUNTER — Encounter (HOSPITAL_COMMUNITY): Payer: Self-pay | Admitting: Cardiology

## 2019-04-15 ENCOUNTER — Telehealth: Payer: Self-pay | Admitting: Family Medicine

## 2019-04-15 NOTE — Telephone Encounter (Signed)
Patient states that she is completely out of estradiol 1.6mg  and Estriol 3.2mg . [facility] is where she needs it sent to. This is there number: [phone]. Patient states she gets it mailed to her. She would like a call back once completed. Please advise.

## 2019-04-15 NOTE — Telephone Encounter (Signed)
I see a fax was placed in your basket.

## 2019-04-15 NOTE — Telephone Encounter (Signed)
She needs to call her pharmacy and have the fax refill request.

## 2019-04-18 NOTE — Telephone Encounter (Signed)
This has been addressed..Tyce Delcid [name], CMA  

## 2019-04-26 ENCOUNTER — Other Ambulatory Visit: Payer: Self-pay

## 2019-04-26 ENCOUNTER — Ambulatory Visit (INDEPENDENT_AMBULATORY_CARE_PROVIDER_SITE_OTHER): Payer: Medicare HMO | Admitting: Family Medicine

## 2019-04-26 ENCOUNTER — Encounter: Payer: Self-pay | Admitting: Family Medicine

## 2019-04-26 VITALS — BP 130/76 | HR 82 | Ht 62.0 in | Wt 135.0 lb

## 2019-04-26 DIAGNOSIS — E038 Other specified hypothyroidism: Secondary | ICD-10-CM | POA: Diagnosis not present

## 2019-04-26 DIAGNOSIS — G43109 Migraine with aura, not intractable, without status migrainosus: Secondary | ICD-10-CM | POA: Diagnosis not present

## 2019-04-26 DIAGNOSIS — L659 Nonscarring hair loss, unspecified: Secondary | ICD-10-CM | POA: Diagnosis not present

## 2019-04-26 DIAGNOSIS — E032 Hypothyroidism due to medicaments and other exogenous substances: Secondary | ICD-10-CM

## 2019-04-26 NOTE — Progress Notes (Signed)
Established Patient Office Visit  Subjective:  Patient ID: Rachel Keith, female    DOB: November 07, 1940  Age: 78 y.o. MRN: [id number]  CC:  Chief Complaint  Patient presents with  . Hypothyroidism    pt requested to have a refill on Fiorinal 50-325-40 [facility], and would like Dr. Linford ArnoldMetheney to increase the dosage of the Estradiol and Estrace and send this to [facility]    HPI Rachel Doffinglaine Leverette presents for   Hypothyroidism - Taking medication regularly in the AM away from food and vitamins, etc. No recent change to skin.  She still c/o hair loss.  Says she quit taking her thyroid pill about 3 weeks ago because she felt like it was making her extremely sleepy.  We had put her on Armour Thyroid.  The pill that she did the best with was the Nature-Throid she felt great on it and her last TSH looked great but unfortunately there was a recall and so she has been unable to get it  Migraine headaches-she has not had Fiorinal filled in years but would like to have a prescription on hand just that it is available but she does use it infrequently.  She is also very concerned about her hair loss and would actually like to have her hormone therapy increased.  She also would like to have me talk with [facility] about thyroid hormone replacement and what options would be as she has not done well with traditional thyroid medication such as levothyroxine, and Synthroid.  She also recently had a transesophageal echo as well as a right and left heart catheterization.  She was extremely upset because she thought she was actually going to be put to sleep for these procedures and was not.  Unfortunately she became very nauseated and vomited multiple times after the procedure.  Past Medical History:  Diagnosis Date  . Anemia   . Anxiety   . Atrial fibrillation with RVR (HCC)   . Blood in the urine   . Chronic kidney disease (CKD), stage III (moderate)    [name]/notes 05/28/2015  . High cholesterol   .  History of blood transfusion 08/2015   "fell and split my head open; got 2 units"  . Hypertension   . Hypothyroidism   . Nephrolithiasis     Past Surgical History:  Procedure Laterality Date  . ABDOMINAL HYSTERECTOMY    . ANKLE FRACTURE SURGERY Left ~ 2006  . APPENDECTOMY    . CARDIOVERSION  2017   [name]/notes 07/26/2015  . CATARACT EXTRACTION W/ INTRAOCULAR LENS  IMPLANT, BILATERAL Bilateral 09/06/2010  . CYSTOSCOPY W/ STONE MANIPULATION  06/2015 X 2  . FINGER SURGERY Right    "had bone growing out on the side of little finger"  . FRACTURE SURGERY    . LAPAROSCOPIC CHOLECYSTECTOMY    . RIGHT/LEFT HEART CATH AND CORONARY ANGIOGRAPHY N/A 03/23/2019   Procedure: RIGHT/LEFT HEART CATH AND CORONARY ANGIOGRAPHY;  Surgeon: Kathleene HazelMcAlhany, Christopher D, MD;  Location: [facility] INVASIVE [facility];  Service: Cardiovascular;  Laterality: N/A;  . TEE WITHOUT CARDIOVERSION N/A 10/04/2015   Procedure: TRANSESOPHAGEAL ECHOCARDIOGRAM (TEE);  Surgeon: Thurmon FairMihai Croitoru, MD;  [facility];  Service: Cardiovascular;  Laterality: N/A;  . TEE WITHOUT CARDIOVERSION N/A 03/23/2019   Procedure: TRANSESOPHAGEAL ECHOCARDIOGRAM (TEE);  Surgeon: Jake BatheSkains, Mark C, MD;  [facility];  Service: Cardiovascular;  Laterality: N/A;  . TONSILLECTOMY      Family History  Problem Relation Age of Onset  . Alcohol abuse Mother   .  Liver disease Mother   . Alcohol abuse Father     Social History   Socioeconomic History  . Marital status: Divorced    Spouse name: Not on file  . Number of children: 4  . Years of education: Not on file  . Highest education level: Not on file  Occupational History  . Occupation: retired  Tobacco Use  . Smoking status: Never Smoker  . Smokeless tobacco: Never Used  Substance and Sexual Activity  . Alcohol use: Yes    Alcohol/week: 0.0 standard drinks    Comment: 09/13/2015 "might have 1 drink/year"  . Drug use: No  . Sexual activity: Never  Other Topics Concern  . Not on file   Social History Narrative   1 caffeinated drink daily    Social Determinants of Health   Financial Resource Strain:   . Difficulty of Paying Living Expenses: Not on file  Food Insecurity:   . Worried About Charity fundraiser in the Last Year: Not on file  . Ran Out of Food in the Last Year: Not on file  Transportation Needs:   . Lack of Transportation (Medical): Not on file  . Lack of Transportation (Non-Medical): Not on file  Physical Activity:   . Days of Exercise per Week: Not on file  . Minutes of Exercise per Session: Not on file  Stress:   . Feeling of Stress : Not on file  Social Connections:   . Frequency of Communication with Friends and Family: Not on file  . Frequency of Social Gatherings with Friends and Family: Not on file  . Attends Religious Services: Not on file  . Active Member of Clubs or Organizations: Not on file  . Attends Archivist Meetings: Not on file  . Marital Status: Not on file  Intimate Partner Violence:   . Fear of Current or Ex-Partner: Not on file  . Emotionally Abused: Not on file  . Physically Abused: Not on file  . Sexually Abused: Not on file    Outpatient Medications Prior to Visit  Medication Sig Dispense Refill  . B Complex Vitamins (B COMPLEX PO) Take 1 tablet by mouth daily.    . Biotin 5 MG TABS Take 1 tablet by mouth daily.    . diazepam (VALIUM) 10 MG tablet TAKE 1 TABLET (10 MG TOTAL) BY MOUTH DAILY AS NEEDED. FOR ANXIETY 90 tablet 0  . ELIQUIS 5 MG TABS tablet TAKE 1 TABLET (5 MG TOTAL) BY MOUTH 2 (TWO) TIMES DAILY. NAME BRAND ONLY 180 tablet 0  . furosemide (LASIX) 40 MG tablet Take 1 tablet (40 mg total) by mouth daily as needed. Keep upcoming appt for future refills ./cy (Patient taking differently: Take 20 mg by mouth daily as needed for edema. Keep upcoming appt for future refills ./cy) 90 tablet 3  . metoprolol succinate (TOPROL XL) 25 MG 24 hr tablet Take 1 tablet (25 mg total) by mouth daily. 90 tablet 3  .  metoprolol succinate (TOPROL-XL) 50 MG 24 hr tablet Take 1 tablet (50 mg total) by mouth daily. To be taken with the 25 mg for total of 75 mg daily. 90 tablet 1  . Omega-3 Fatty Acids (FISH OIL) 1000 MG CAPS Take 3 capsules by mouth daily.     . Selenium 200 MCG TABS Take 1 tablet by mouth daily.    Marland Kitchen tretinoin (RETIN-A) 0.025 % cream Apply topically at bedtime as needed. 45 g 1  . TURMERIC CURCUMIN PO Take 1  tablet by mouth daily.    Mack Guise THYROID 90 MG tablet Take 1 tablet (90 mg total) by mouth daily. 30 tablet 1   Facility-Administered Medications Prior to Visit  Medication Dose Route Frequency Provider Last Rate Last Admin  . sodium chloride flush (NS) 0.9 % injection 3 mL  3 mL Intravenous Q12H Crenshaw, Madolyn Frieze, MD        Allergies  Allergen Reactions  . Amlodipine Other (See Comments)    Didn't feel well on 2.5 mg dose.  Didn't feel well on 2.5 mg dose.   Marland Kitchen Lisinopril Other (See Comments)    Hair Loss  . Sulfa Antibiotics Rash    Broke out in her joints one time as a teenager  . Sulfacetamide Sodium Rash    Broke out in her joints one time as a teenager    ROS Review of Systems    Objective:    Physical Exam  Constitutional: She is oriented to person, place, and time. She appears well-developed and well-nourished.  HENT:  Head: Normocephalic and atraumatic.  Neck: No thyromegaly present.  Cardiovascular: Normal rate, regular rhythm and normal heart sounds.  Pulmonary/Chest: Effort normal and breath sounds normal.  Musculoskeletal:     Cervical back: Neck supple.  Lymphadenopathy:    She has no cervical adenopathy.  Neurological: She is alert and oriented to person, place, and time.  Skin: Skin is warm and dry.  Psychiatric: She has a normal mood and affect. Her behavior is normal.    BP 130/76   Pulse 82   Ht  (1.575 m)   Wt 135 lb (61.2 kg)   SpO2 99%   BMI 24.69 kg/m  Wt Readings from Last 3 Encounters:  04/26/19 135 lb (61.2 kg)  03/16/19 133  lb (60.3 kg)  02/09/19 134 lb (60.8 kg)     Health Maintenance Due  Topic Date Due  . DEXA SCAN  12/22/2004    There are no preventive care reminders to display for this patient.  Lab Results  Component Value Date   TSH 1.64 01/11/2019   Lab Results  Component Value Date   WBC 10.2 03/16/2019   HGB 11.9 (L) 03/23/2019   HGB 11.9 (L) 03/23/2019   HCT 35.0 (L) 03/23/2019   HCT 35.0 (L) 03/23/2019   MCV 91 03/16/2019   PLT 169 03/16/2019   Lab Results  Component Value Date   NA 145 03/23/2019   NA 145 03/23/2019   K 3.1 (L) 03/23/2019   K 3.2 (L) 03/23/2019   CO2 23 03/16/2019   GLUCOSE 79 03/16/2019   BUN 16 03/16/2019   CREATININE 0.99 03/16/2019   BILITOT 1.0 01/11/2019   ALKPHOS 64 09/14/2015   AST 20 01/11/2019   ALT 12 01/11/2019   PROT 6.9 01/11/2019   ALBUMIN 2.7 (L) 09/15/2015   CALCIUM 9.9 03/16/2019   ANIONGAP 9 09/16/2015   Lab Results  Component Value Date   CHOL 164 01/11/2019   Lab Results  Component Value Date   HDL 46 (L) 01/11/2019   Lab Results  Component Value Date   LDLCALC 91 01/11/2019   Lab Results  Component Value Date   TRIG 175 (H) 01/11/2019   Lab Results  Component Value Date   CHOLHDL 3.6 01/11/2019   No results found for: HGBA1C    Assessment & Plan:   Problem List Items Addressed This Visit      Cardiovascular and Mediastinum   Migraine with aura  Will refill Fiorinal.        Endocrine   Hypothyroidism - Primary    We discussed the importance of actually taking thyroid medication.  It is really highly unusual that the Armour Thyroid would make her make her super.  Unfortunately Nature-Throid is what she did the best on but it is not available right now as it has been recalled.  I also discussed with her that she cannot go without thyroid medication and we discussed the potential negative health impacts of doing so.  She is already been off of her medication for 1 week.  Also increase her potential  complications from atrial fibrillation.      Relevant Orders   TSH   TSH     Other   Hair loss    This is a chronic and ongoing problem which she feels is directly tied to her thyroid medication.  We have discussed minoxidil in the past and she has been resistant to use it.  I mentioned it again during the visit today.  We have also discussed a dermatology referrals in the past as well.  She is on by identical hormones and wants to actually go up on her dose but I do not feel that this is appropriate at age 78.      Relevant Orders   TSH      No orders of the defined types were placed in this encounter.   Follow-up: Return in about 3 months (around 07/25/2019) for recheck thyroid. Nani Gasser, MD

## 2019-04-27 ENCOUNTER — Encounter: Payer: Self-pay | Admitting: Family Medicine

## 2019-04-27 ENCOUNTER — Telehealth: Payer: Self-pay | Admitting: Family Medicine

## 2019-04-27 DIAGNOSIS — L659 Nonscarring hair loss, unspecified: Secondary | ICD-10-CM | POA: Diagnosis not present

## 2019-04-27 DIAGNOSIS — E038 Other specified hypothyroidism: Secondary | ICD-10-CM | POA: Diagnosis not present

## 2019-04-27 DIAGNOSIS — E032 Hypothyroidism due to medicaments and other exogenous substances: Secondary | ICD-10-CM | POA: Diagnosis not present

## 2019-04-27 MED ORDER — BUTALBITAL-ASPIRIN-CAFFEINE 50-325-40 MG PO CAPS: ORAL_CAPSULE | ORAL | 1 refills | Status: DC

## 2019-04-27 NOTE — Assessment & Plan Note (Signed)
Will refill Fiorinal.

## 2019-04-27 NOTE — Telephone Encounter (Signed)
Patient calling in stating that she needs a refill on her butalbital-aspirin-caffeine [facility]) 50-325-40 MG per capsule [[id number]] to pharmacy on file. Please advise.   Patient is also going to [facility] downstairs because [facility] could not find the [facility]. Wanted to advise PCP.

## 2019-04-27 NOTE — Assessment & Plan Note (Signed)
We discussed the importance of actually taking thyroid medication.  It is really highly unusual that the Armour Thyroid would make her make her super.  Unfortunately Nature-Throid is what she did the best on but it is not available right now as it has been recalled.  I also discussed with her that she cannot go without thyroid medication and we discussed the potential negative health impacts of doing so.  She is already been off of her medication for 1 week.  Also increase her potential complications from atrial fibrillation.

## 2019-04-27 NOTE — Telephone Encounter (Signed)
Pt was advised that this would be sent for her yesterday before she left the office. In addition to [facility]) she wanted the estradiol and estrace to be sent to [facility].Maryruth Eve, [place] Crocker, CMA

## 2019-04-27 NOTE — Telephone Encounter (Signed)
Called and spoke w/Rana and asked that she fax over the requested medications. She informed me that she has refills on these medications.   She is going to call the pt to make her aware that she already has refills and that she doesn't need a new prescription .Elouise Munroe, [place]

## 2019-04-27 NOTE — Assessment & Plan Note (Signed)
This is a chronic and ongoing problem which she feels is directly tied to her thyroid medication.  We have discussed minoxidil in the past and she has been resistant to use it.  I mentioned it again during the visit today.  We have also discussed a dermatology referrals in the past as well.  She is on by identical hormones and wants to actually go up on her dose but I do not feel that this is appropriate at age 78.

## 2019-04-27 NOTE — Telephone Encounter (Signed)
Please call [facility] and see if they have any recommendations.  Patient did best on Nature-Throid thyroid replacement but it has been recalled from the market.  She feels sleepy with Armour Thyroid and has already tried levothyroxine and branded Synthroid.  If they have any other suggestions that would be helpful.

## 2019-04-27 NOTE — Telephone Encounter (Signed)
She did not ask me for the Fiorinal yesterday but I will be happy to send.  In addition we will need to call [facility] and have them fax a refill request so that I can sign it we can fax it back.

## 2019-04-28 LAB — TSH: TSH: 8.97 mIU/L — ABNORMAL HIGH (ref 0.40–4.50)

## 2019-04-29 NOTE — Telephone Encounter (Signed)
I called the pharmacy and there was a temporary re-call and they have some that [facility] reports that they will fill the nature thyroid if the patient wants any. Per the last conversations with the pharmacist Torin does not want to take anything for her thyroid. She is wanting to stop thyroid replacement all together. They do not have any other recommendations.

## 2019-05-02 ENCOUNTER — Ambulatory Visit (INDEPENDENT_AMBULATORY_CARE_PROVIDER_SITE_OTHER): Payer: Medicare HMO | Admitting: Family Medicine

## 2019-05-02 ENCOUNTER — Encounter: Payer: Self-pay | Admitting: Family Medicine

## 2019-05-02 VITALS — Ht 62.0 in | Wt 133.0 lb

## 2019-05-02 DIAGNOSIS — E039 Hypothyroidism, unspecified: Secondary | ICD-10-CM

## 2019-05-02 DIAGNOSIS — L659 Nonscarring hair loss, unspecified: Secondary | ICD-10-CM | POA: Diagnosis not present

## 2019-05-02 DIAGNOSIS — E038 Other specified hypothyroidism: Secondary | ICD-10-CM

## 2019-05-02 DIAGNOSIS — Z79890 Hormone replacement therapy (postmenopausal): Secondary | ICD-10-CM | POA: Diagnosis not present

## 2019-05-02 NOTE — Progress Notes (Signed)
Virtual Visit via Telephone Note  I connected with Rachel Keith on 05/02/19 at  9:10 AM EST by telephone and verified that I am speaking with the correct person using two identifiers.   I discussed the limitations, risks, security and privacy concerns of performing an evaluation and management service by telephone and the availability of in person appointments. I also discussed with the patient that there may be a patient responsible charge related to this service. The patient expressed understanding and agreed to proceed.   Subjective:    CC: wants to double her dose.   HPI:  F/U HRT - she wants to increase her Bi-est to tabs daily. She recently double up on he own about 10 days ago.  She has a history of complete hysterectomy.  She says when she first went through menopause after her hysterectomy she was actually on estrogen injections.  40 mg.  About 8 years ago [facility] seeing [facility] and has been on by S since then.  Her biggest concern is that she is had significant hair loss for years.  She feels like since increasing her dose about 10 days ago she feels like she is already noticed some hair growth improvement.  That when she was in here about a week ago she had also stopped her Armour Thyroid because it was causing excess sedation and she felt like it was actually making her hair loss worse.  It was also given her burning sensation in her eyes.  She has not restarted any type of thyroid medication the one that she felt the best on was Nature-Throid but unfortunately it is currently recalled.  She did go to have her TSH drawn after being off the thyroid medication for several weeks and it was 8.9.  Hypothyroid - she is no longer on supplementation.     Past medical history, Surgical history, Family history not pertinant except as noted below, Social history, Allergies, and medications have been entered into the medical record, reviewed, and corrections made.   Review of Systems:  No fevers, chills, night sweats, weight loss, chest pain, or shortness of breath.   Objective:    General: Speaking clearly in complete sentences without any shortness of breath.  Alert and oriented x3.  Normal judgment. No apparent acute distress.    Impression and Recommendations:    Hypothyroid -no longer on supplementation.  We discussed that long-term there would be some negative effects if she does not take thyroid medication.  Plan to recheck TSH in 3 months.  Hormones therapy -she is currently doubled up on her by asked but we discussed that it is not good to double up on medication without first knowing what is going on with her hormones.  She feels so good that she plans on continuing to take 2 daily.  Plan on [facility] out to [facility] to discuss some options.  They discussed maybe balancing the estrogen with progesterone therapy. She did have some hormone testing done with them back in 2012.  Hair loss-we did discuss that with any medication changes it can oftentimes take anywhere between 3 to 6 months does notice significant changes in hair growth.  It would be surprising to have new hair growth after only 10 days of medication.  Some wondering if what she is seeing is actually coming from being off the Armour Thyroid.    I discussed the assessment and treatment plan with the patient. The patient was provided an opportunity to ask questions and  Keith were answered. The patient agreed with the plan and demonstrated an understanding of the instructions.   The patient was advised to call back or seek an in-person evaluation if the symptoms worsen or if the condition fails to improve as anticipated.  I provided 20 minutes of non-face-to-face time during this encounter.   Nani Gasser, MD

## 2019-05-02 NOTE — Progress Notes (Signed)
Called pt to do her prescreening. No answer/or VM.Rachel Keith, [place]

## 2019-05-11 ENCOUNTER — Telehealth: Payer: Self-pay

## 2019-05-11 NOTE — Telephone Encounter (Signed)
The pharmacy is getting it ready for pick up.

## 2019-05-11 NOTE — Telephone Encounter (Signed)
I believe it was either faxed Monday or maybe even yesterday morning.  So we may need to call the pharmacy just verify that they did receive it if not then we can refax it.

## 2019-05-11 NOTE — Telephone Encounter (Signed)
Jazzy called about Progesterone prescription. She states she was expecting it to be sent to [facility]. Please advise.

## 2019-05-17 ENCOUNTER — Ambulatory Visit: Payer: Medicare HMO | Admitting: Family Medicine

## 2019-07-28 ENCOUNTER — Other Ambulatory Visit: Payer: Self-pay | Admitting: Family Medicine

## 2019-08-03 ENCOUNTER — Telehealth: Payer: Self-pay | Admitting: Family Medicine

## 2019-08-03 NOTE — Telephone Encounter (Signed)
Received fax for PA on Diazepam sent through cover my meds waiting on determination. - CF

## 2019-08-04 NOTE — Telephone Encounter (Signed)
Received fax from [facility] and they approved coverage on Diazepam from 05/13/19 - 09/02/2019. - CF

## 2019-08-08 ENCOUNTER — Other Ambulatory Visit: Payer: Self-pay | Admitting: Family Medicine

## 2019-08-08 DIAGNOSIS — I4891 Unspecified atrial fibrillation: Secondary | ICD-10-CM

## 2019-08-21 ENCOUNTER — Other Ambulatory Visit: Payer: Self-pay | Admitting: Family Medicine

## 2019-10-31 ENCOUNTER — Ambulatory Visit: Payer: Medicare HMO | Admitting: Family Medicine

## 2019-11-03 ENCOUNTER — Ambulatory Visit (INDEPENDENT_AMBULATORY_CARE_PROVIDER_SITE_OTHER): Payer: Medicare HMO | Admitting: Family Medicine

## 2019-11-03 ENCOUNTER — Encounter: Payer: Self-pay | Admitting: Family Medicine

## 2019-11-03 VITALS — BP 128/87 | HR 88 | Ht 62.0 in | Wt 139.0 lb

## 2019-11-03 DIAGNOSIS — E038 Other specified hypothyroidism: Secondary | ICD-10-CM

## 2019-11-03 DIAGNOSIS — F439 Reaction to severe stress, unspecified: Secondary | ICD-10-CM

## 2019-11-03 DIAGNOSIS — I10 Essential (primary) hypertension: Secondary | ICD-10-CM | POA: Diagnosis not present

## 2019-11-03 DIAGNOSIS — Z79890 Hormone replacement therapy (postmenopausal): Secondary | ICD-10-CM | POA: Diagnosis not present

## 2019-11-03 DIAGNOSIS — N1831 Chronic kidney disease, stage 3a: Secondary | ICD-10-CM | POA: Diagnosis not present

## 2019-11-03 DIAGNOSIS — R69 Illness, unspecified: Secondary | ICD-10-CM | POA: Diagnosis not present

## 2019-11-03 MED ORDER — SERTRALINE HCL 50 MG PO TABS: 50.0000 mg | ORAL_TABLET | Freq: Every day | ORAL | 2 refills | Status: DC

## 2019-11-03 MED ORDER — AMBULATORY NON FORMULARY MEDICATION: 0 refills | Status: DC

## 2019-11-03 MED ORDER — ATENOLOL 100 MG PO TABS: 100.0000 mg | ORAL_TABLET | Freq: Every day | ORAL | 1 refills | Status: DC

## 2019-11-03 NOTE — Progress Notes (Signed)
Pt stated that she would like to restart the atenolol. She said that the metoprolol causes her to urinate too much and her heart races on it.

## 2019-11-03 NOTE — Assessment & Plan Note (Signed)
Overdue to recheck renal function. Lab printed.
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
## 2019-11-03 NOTE — Assessment & Plan Note (Signed)
I am concerned she hasn't been on medication x 6 months. She felt like it was causing symptoms.  She says she has felt great off of it.  But I am concerned that if she has untreated hypothyroidism that it could actually worsen her A. fib or even cause heart failure.  And also increase her risk for arrhythmia.

## 2019-11-03 NOTE — Assessment & Plan Note (Signed)
BP looks great today.  She wants to change back to atenolol. F/U in 3 months.

## 2019-11-04 DIAGNOSIS — F439 Reaction to severe stress, unspecified: Secondary | ICD-10-CM | POA: Insufficient documentation
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
## 2019-11-08 DIAGNOSIS — I10 Essential (primary) hypertension: Secondary | ICD-10-CM | POA: Diagnosis not present

## 2019-11-08 DIAGNOSIS — E038 Other specified hypothyroidism: Secondary | ICD-10-CM | POA: Diagnosis not present

## 2019-11-08 DIAGNOSIS — Z79890 Hormone replacement therapy (postmenopausal): Secondary | ICD-10-CM | POA: Diagnosis not present

## 2019-11-08 DIAGNOSIS — N1831 Chronic kidney disease, stage 3a: Secondary | ICD-10-CM | POA: Diagnosis not present

## 2019-11-09 LAB — CBC
HCT: 42.1 % (ref 35.0–45.0)
Hemoglobin: 14.3 g/dL (ref 11.7–15.5)
MCH: 33.6 pg — ABNORMAL HIGH (ref 27.0–33.0)
MCHC: 34 g/dL (ref 32.0–36.0)
MCV: 98.8 fL (ref 80.0–100.0)
MPV: 11.3 fL (ref 7.5–12.5)
Platelets: 138 10*3/uL — ABNORMAL LOW (ref 140–400)
RBC: 4.26 10*6/uL (ref 3.80–5.10)
RDW: 13.6 % (ref 11.0–15.0)
WBC: 9 10*3/uL (ref 3.8–10.8)

## 2019-11-09 LAB — COMPLETE METABOLIC PANEL WITH GFR
AG Ratio: 1.6 (calc) (ref 1.0–2.5)
ALT: 19 U/L (ref 6–29)
AST: 25 U/L (ref 10–35)
Albumin: 4 g/dL (ref 3.6–5.1)
Alkaline phosphatase (APISO): 60 U/L (ref 37–153)
BUN/Creatinine Ratio: 13 (calc) (ref 6–22)
BUN: 14 mg/dL (ref 7–25)
CO2: 24 mmol/L (ref 20–32)
Calcium: 9.2 mg/dL (ref 8.6–10.4)
Chloride: 106 mmol/L (ref 98–110)
Creat: 1.11 mg/dL — ABNORMAL HIGH (ref 0.60–0.93)
GFR, Est African American: 55 mL/min/{1.73_m2} — ABNORMAL LOW (ref >=60)
GFR, Est Non African American: 47 mL/min/{1.73_m2} — ABNORMAL LOW (ref >=60)
Globulin: 2.5 g/dL (calc) (ref 1.9–3.7)
Glucose, Bld: 77 mg/dL (ref 65–99)
Potassium: 4.1 mmol/L (ref 3.5–5.3)
Sodium: 141 mmol/L (ref 135–146)
Total Bilirubin: 0.5 mg/dL (ref 0.2–1.2)
Total Protein: 6.5 g/dL (ref 6.1–8.1)

## 2019-11-09 LAB — TSH: TSH: 52.85 mIU/L — ABNORMAL HIGH (ref 0.40–4.50)

## 2019-11-17 ENCOUNTER — Other Ambulatory Visit: Payer: Self-pay | Admitting: Family Medicine

## 2019-11-17 MED ORDER — THYROID 90 MG PO TABS
90.0000 mg | ORAL_TABLET | Freq: Every morning | ORAL | 0 refills | Status: DC
Start: 2019-11-17 — End: 2020-01-02

## 2019-11-17 NOTE — Progress Notes (Signed)
Thyroid med sent fo r30 days

## 2019-12-05 ENCOUNTER — Other Ambulatory Visit: Payer: Self-pay | Admitting: Family Medicine

## 2019-12-06 NOTE — Telephone Encounter (Signed)
Last filled 04/26/2020 #30 with 1 refill Last seen 11/03/2019

## 2019-12-29 ENCOUNTER — Telehealth: Payer: Self-pay

## 2019-12-29 DIAGNOSIS — E032 Hypothyroidism due to medicaments and other exogenous substances: Secondary | ICD-10-CM

## 2019-12-29 NOTE — Telephone Encounter (Signed)
Thyroid testing

## 2019-12-30 DIAGNOSIS — E032 Hypothyroidism due to medicaments and other exogenous substances: Secondary | ICD-10-CM | POA: Diagnosis not present

## 2019-12-31 LAB — TSH: TSH: 0.67 mIU/L (ref 0.40–4.50)

## 2020-01-01 ENCOUNTER — Other Ambulatory Visit: Payer: Self-pay | Admitting: Family Medicine

## 2020-01-25 ENCOUNTER — Other Ambulatory Visit: Payer: Self-pay | Admitting: Family Medicine

## 2020-01-30 ENCOUNTER — Other Ambulatory Visit: Payer: Self-pay | Admitting: Family Medicine

## 2020-01-30 DIAGNOSIS — I4891 Unspecified atrial fibrillation: Secondary | ICD-10-CM

## 2020-02-01 ENCOUNTER — Other Ambulatory Visit: Payer: Self-pay | Admitting: Family Medicine

## 2020-02-01 DIAGNOSIS — I4891 Unspecified atrial fibrillation: Secondary | ICD-10-CM

## 2020-02-09 ENCOUNTER — Other Ambulatory Visit: Payer: Self-pay

## 2020-02-09 ENCOUNTER — Other Ambulatory Visit: Payer: Self-pay | Admitting: Family Medicine

## 2020-02-09 DIAGNOSIS — I4891 Unspecified atrial fibrillation: Secondary | ICD-10-CM

## 2020-02-09 NOTE — Telephone Encounter (Signed)
Kami states she takes the Metoprolol 50 mg daily not the Metoprolol 25 mg. She states she needs a refill on the Metoprolol 50 mg. Please advise.

## 2020-02-10 MED ORDER — METOPROLOL SUCCINATE ER 50 MG PO TB24: 50.0000 mg | ORAL_TABLET | Freq: Every day | ORAL | 1 refills | Status: DC

## 2020-02-10 NOTE — Addendum Note (Signed)
Addended by: Chalmers Cater on: 02/10/2020 01:04 PM   Modules accepted: Orders

## 2020-02-10 NOTE — Telephone Encounter (Signed)
Med sent.

## 2020-02-14 ENCOUNTER — Other Ambulatory Visit: Payer: Self-pay | Admitting: Family Medicine

## 2020-02-15 ENCOUNTER — Other Ambulatory Visit: Payer: Self-pay | Admitting: Family Medicine

## 2020-02-16 ENCOUNTER — Telehealth: Payer: Self-pay

## 2020-02-16 NOTE — Telephone Encounter (Signed)
PA submitted through Cover My Meds for Diazepam.

## 2020-02-17 NOTE — Telephone Encounter (Signed)
PA approved.

## 2020-04-10 ENCOUNTER — Telehealth: Payer: Self-pay | Admitting: Family Medicine

## 2020-04-10 NOTE — Telephone Encounter (Signed)
butalbital-aspirin-caffeine (FIORINAL) 50-325-40 MG capsule take this for her migraines but she feels like she needs more than 30 pills so she does not run out like she has,  She also wants to change her pharmacy to [facility] [address] in [place] and would like for it sent there.

## 2020-04-10 NOTE — Telephone Encounter (Signed)
Will fwd to pcp for approval of more tablets.

## 2020-04-11 MED ORDER — BUTALBITAL-ASPIRIN-CAFFEINE 50-325-40 MG PO CAPS
ORAL_CAPSULE | ORAL | 0 refills | Status: DC
Start: 2020-04-11 — End: 2020-05-15

## 2020-04-11 NOTE — Telephone Encounter (Signed)
Is she talking about a 90-day supply?  Or a 30-day supply?  Because 30 tabs for 30 days is actually plenty.  Please clarify.  And if she is having that many headaches then we need to address it further with other medication then just going through a lot of rescue medicine.

## 2020-04-11 NOTE — Telephone Encounter (Signed)
OK refill sent. Use sparingly

## 2020-04-11 NOTE — Telephone Encounter (Signed)
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 She stated that she was seen by an ophthalmologist and told this.   When I called her to clarify she stated that she has a son that she is constantly stressed over because he talks about suicide regularly which causes her to have the headaches. I informed her that this is not a medication that she "should" be taking on a daily basis only as needed. She stated that she understood and asked that a 30 day supply be sent to [facility] in [place].

## 2020-04-12 NOTE — Telephone Encounter (Signed)
Patient advised.

## 2020-04-19 ENCOUNTER — Telehealth: Payer: Self-pay | Admitting: Family Medicine

## 2020-04-19 NOTE — Telephone Encounter (Signed)
NO she needs to pick one.  We can increase the atenolol to twice a day if that works better  But can't be on both.

## 2020-04-19 NOTE — Telephone Encounter (Signed)
Please call patient.  It looks like per the pharmacy notification that she recently filled a prescription for metoprolol on September 30 and then turned around and put a prescription for atenolol on October 25.  These medicines do the same thing such as please let her know that I would recommend that she not feel both it is likely that she still had refills on all medication.  Per my records she should just be on the metoprolol.

## 2020-04-19 NOTE — Telephone Encounter (Signed)
Asmara states she does take both. She states she decided to take half of the Atenolol at night and a whole tablet of Metoprolol in the morning. She states she would rather take the half of tablet of the Atenolol and stop the Metoprolol. She states the Metoprolol makes her urinate. She is very happy with taking both. Please advise.   I called [facility] and they do not have any prescriptions of the Atenolol.

## 2020-04-20 MED ORDER — ATENOLOL 50 MG PO TABS: 50.0000 mg | ORAL_TABLET | Freq: Two times a day (BID) | ORAL | 1 refills | Status: DC

## 2020-04-20 NOTE — Telephone Encounter (Signed)
Called pt and she is agreeable to the Atenolol BID.I asked her what dosage she is taking she stated that she has the Atenolol 100 mg that she cuts in half. I told her that we would send the Atenolol 50 mg for her to take twice daily. She then informed me that she has enough right now. I will however send over the new prescription and ask that the 100 mg be d/c and wait for the pt to call for the refill.   She asked that it be sent to [facility] in [place].

## 2020-05-15 ENCOUNTER — Other Ambulatory Visit: Payer: Self-pay

## 2020-05-15 ENCOUNTER — Encounter: Payer: Self-pay | Admitting: Family Medicine

## 2020-05-15 ENCOUNTER — Ambulatory Visit (INDEPENDENT_AMBULATORY_CARE_PROVIDER_SITE_OTHER): Payer: Medicare HMO | Admitting: Family Medicine

## 2020-05-15 VITALS — BP 123/79 | HR 84 | Ht 62.0 in | Wt 131.3 lb

## 2020-05-15 DIAGNOSIS — Z79890 Hormone replacement therapy (postmenopausal): Secondary | ICD-10-CM

## 2020-05-15 DIAGNOSIS — E032 Hypothyroidism due to medicaments and other exogenous substances: Secondary | ICD-10-CM

## 2020-05-15 DIAGNOSIS — G43109 Migraine with aura, not intractable, without status migrainosus: Secondary | ICD-10-CM

## 2020-05-15 DIAGNOSIS — N1831 Chronic kidney disease, stage 3a: Secondary | ICD-10-CM | POA: Diagnosis not present

## 2020-05-15 DIAGNOSIS — I10 Essential (primary) hypertension: Secondary | ICD-10-CM

## 2020-05-15 DIAGNOSIS — I4891 Unspecified atrial fibrillation: Secondary | ICD-10-CM | POA: Diagnosis not present

## 2020-05-15 LAB — BASIC METABOLIC PANEL WITH GFR
BUN/Creatinine Ratio: 17 (calc) (ref 6–22)
BUN: 17 mg/dL (ref 7–25)
CO2: 29 mmol/L (ref 20–32)
Calcium: 9.7 mg/dL (ref 8.6–10.4)
Chloride: 104 mmol/L (ref 98–110)
Creat: 1.03 mg/dL — ABNORMAL HIGH (ref 0.60–0.88)
GFR, Est African American: 59 mL/min/{1.73_m2} — ABNORMAL LOW (ref >=60)
GFR, Est Non African American: 51 mL/min/{1.73_m2} — ABNORMAL LOW (ref >=60)
Glucose, Bld: 89 mg/dL (ref 65–99)
Potassium: 3.5 mmol/L (ref 3.5–5.3)
Sodium: 142 mmol/L (ref 135–146)

## 2020-05-15 LAB — LIPID PANEL
Cholesterol: 134 mg/dL (ref <200)
HDL: 37 mg/dL — ABNORMAL LOW (ref >=50)
LDL Cholesterol (Calc): 70 mg/dL (calc)
Non-HDL Cholesterol (Calc): 97 mg/dL (calc) (ref <130)
Total CHOL/HDL Ratio: 3.6 (calc) (ref <5.0)
Triglycerides: 194 mg/dL — ABNORMAL HIGH (ref <150)

## 2020-05-15 LAB — TSH: TSH: 0.5 mIU/L (ref 0.40–4.50)

## 2020-05-15 MED ORDER — BUTALBITAL-ASPIRIN-CAFFEINE 50-325-40 MG PO CAPS: ORAL_CAPSULE | ORAL | 0 refills | Status: DC

## 2020-05-15 NOTE — Assessment & Plan Note (Signed)
Well controlled. Continue current regimen. Follow up in  6 mo  

## 2020-05-15 NOTE — Assessment & Plan Note (Addendum)
Symptoms are currently stable.  Due to recheck TSH.

## 2020-05-15 NOTE — Progress Notes (Signed)
Established Patient Office Visit  Subjective:  Patient ID: Rachel Keith, female    DOB: July 28, 1940  Age: 80 y.o. MRN: [id number]  CC:  Chief Complaint  Patient presents with  . Hypothyroidism    HPI  Rachel Keith presents for   Hypertension- Pt denies chest pain, SOB, dizziness, or heart palpitations.  Taking meds as directed w/o problems.  Denies medication side effects.    Hypothyroidism - Taking medication regularly in the AM away from food and vitamins, etc. No recent change to skin, hair, or energy levels.  Ports that she still losing hair but not as much as she was.  She did not start the Rogaine.  Also complains of bilateral wrist pain she felt she was getting a lot of stiffness and soreness.  She recently started a supplement with turmeric and black pepper and says it is actually made a really big difference in her arthritis as well as helping her dry eye.  She says in fact she has not used her dry eyedrops in a couple of months.  Follow-up atrial fibrillation with diastolic heart failure.-denies any significant palpitation she is now taking 50 mg of atenolol twice a day.  No lower extremity swelling.  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  She says that is what she uses the Fiorinal for.  Past Medical History:  Diagnosis Date  . Anemia   . Anxiety   . Atrial fibrillation with RVR (HCC)   . Blood in the urine   . Chronic kidney disease (CKD), stage III (moderate) (HCC)    Rachel Keith 05/28/2015  . High cholesterol   . History of blood transfusion 08/2015   "fell and split my head open; got 2 units"  . Hypertension   . Hypothyroidism   . Nephrolithiasis     Past Surgical History:  Procedure Laterality Date  . ABDOMINAL HYSTERECTOMY    . ANKLE FRACTURE  SURGERY Left ~ 2006  . APPENDECTOMY    . CARDIOVERSION  2017   Rachel Keith 07/26/2015  . CATARACT EXTRACTION W/ INTRAOCULAR LENS  IMPLANT, BILATERAL Bilateral 09/06/2010  . CYSTOSCOPY W/ STONE MANIPULATION  06/2015 X 2  . FINGER SURGERY Right    "had bone growing out on the side of little finger"  . FRACTURE SURGERY    . LAPAROSCOPIC CHOLECYSTECTOMY    . RIGHT/LEFT HEART CATH AND CORONARY ANGIOGRAPHY N/A 03/23/2019   Procedure: RIGHT/LEFT HEART CATH AND CORONARY ANGIOGRAPHY;  Surgeon: Kathleene Hazel, MD;  Location: [facility];  Service: Cardiovascular;  Laterality: N/A;  . TEE WITHOUT CARDIOVERSION N/A 10/04/2015   Procedure: TRANSESOPHAGEAL ECHOCARDIOGRAM (TEE);  Surgeon: Thurmon Fair, MD;  Location: [facility] ENDOSCOPY;  Service: Cardiovascular;  Laterality: N/A;  . TEE WITHOUT CARDIOVERSION N/A 03/23/2019   Procedure: TRANSESOPHAGEAL ECHOCARDIOGRAM (TEE);  Surgeon: Jake Bathe, MD;  Location: [facility] ENDOSCOPY;  Service: Cardiovascular;  Laterality: N/A;  . TONSILLECTOMY      Family History  Problem Relation Age of Onset  . Alcohol abuse Mother   . Liver disease Mother   . Alcohol abuse Father     Social History   Socioeconomic History  . Marital status: Divorced    Spouse name: Not on file  . Number of children: 4  . Years of education:  Not on file  . Highest education level: Not on file  Occupational History  . Occupation: retired  Tobacco Use  . Smoking status: Never Smoker  . Smokeless tobacco: Never Used  Vaping Use  . Vaping Use: Never used  Substance and Sexual Activity  . Alcohol use: Yes    Alcohol/week: 0.0 standard drinks    Comment: 09/13/2015 "might have 1 drink/year"  . Drug use: No  . Sexual activity: Never  Other Topics Concern  . Not on file  Social History Narrative   1 caffeinated drink daily    Social Determinants of Health   Financial Resource Strain: Not on file  Food Insecurity: Not on file  Transportation Needs: Not on file  Physical  Activity: Not on file  Stress: Not on file  Social Connections: Not on file  Intimate Partner Violence: Not on file    Outpatient Medications Prior to Visit  Medication Sig Dispense Refill  . AMBULATORY NON FORMULARY MEDICATION Medication Name: Bi-est topical for HRT through [facility] 1 vial 0  . atenolol (TENORMIN) 50 MG tablet Take 1 tablet (50 mg total) by mouth 2 (two) times daily. 180 tablet 1  . B Complex Vitamins (B COMPLEX PO) Take 1 tablet by mouth daily.    . Biotin 5 MG TABS Take 1 tablet by mouth daily.    . diazepam (VALIUM) 10 MG tablet Take 0.5-1 tablets (5-10 mg total) by mouth daily as needed. for anxiety 90 tablet 0  . ELIQUIS 5 MG TABS tablet TAKE 1 TABLET (5 MG TOTAL) BY MOUTH 2 (TWO) TIMES DAILY. 180 tablet 0  . furosemide (LASIX) 40 MG tablet Take 1 tablet (40 mg total) by mouth daily as needed. Keep upcoming appt for future refills ./cy (Patient taking differently: Take 20 mg by mouth daily as needed for edema. Keep upcoming appt for future refills ./cy) 90 tablet 3  . Omega-3 Fatty Acids (FISH OIL) 1000 MG CAPS Take 3 capsules by mouth daily.     . Selenium 200 MCG TABS Take 1 tablet by mouth daily.    . sertraline (ZOLOFT) 50 MG tablet Take 1 tablet (50 mg total) by mouth daily. 90 tablet 1  . thyroid (ARMOUR THYROID) 90 MG tablet Take 1 tablet (90 mg total) by mouth daily. 90 tablet 3  . tretinoin (RETIN-A) 0.025 % cream Apply topically at bedtime as needed. 45 g 1  . TURMERIC CURCUMIN PO Take 2,000 mg by mouth in the morning and at bedtime.    . butalbital-aspirin-caffeine (FIORINAL) 50-325-40 MG capsule TAKE ONE CAP BY MOUTH EVERY 4 HOURS AS NEEDED FOR HEADACHE 30 capsule 0   No facility-administered medications prior to visit.    Allergies  Allergen Reactions  . Amlodipine Other (See Comments)    Didn't feel well on 2.5 mg dose.  Didn't feel well on 2.5 mg dose.   Marland Kitchen Lisinopril Other (See Comments)    Hair Loss  . Sulfa Antibiotics Rash    Broke out in  her joints one time as a teenager  . Sulfacetamide Sodium Rash    Broke out in her joints one time as a teenager    ROS Review of Systems    Objective:    Physical Exam Constitutional:      Appearance: She is well-developed and well-nourished.  HENT:     Head: Normocephalic and atraumatic.  Cardiovascular:     Rate and Rhythm: Normal rate and regular rhythm.     Heart sounds: Normal heart  sounds.  Pulmonary:     Effort: Pulmonary effort is normal.     Breath sounds: Normal breath sounds.  Skin:    General: Skin is warm and dry.  Neurological:     Mental Status: She is alert and oriented to person, place, and time.  Psychiatric:        Mood and Affect: Mood and affect normal.        Behavior: Behavior normal.     BP 123/79   Pulse 84   Ht 5\' 2"  (1.575 m)   Wt 131 lb 4.8 oz (59.6 kg)   SpO2 100%   BMI 24.02 kg/m  Wt Readings from Last 3 Encounters:  05/15/20 131 lb 4.8 oz (59.6 kg)  11/03/19 139 lb (63 kg)  05/02/19 133 lb (60.3 kg)     Health Maintenance Due  Topic Date Due  . DEXA SCAN  Never done    There are no preventive care reminders to display for this patient.  Lab Results  Component Value Date   TSH 0.67 12/30/2019   Lab Results  Component Value Date   WBC 9.0 11/08/2019   HGB 14.3 11/08/2019   HCT 42.1 11/08/2019   MCV 98.8 11/08/2019   PLT 138 (L) 11/08/2019   Lab Results  Component Value Date   NA 141 11/08/2019   K 4.1 11/08/2019   CO2 24 11/08/2019   GLUCOSE 77 11/08/2019   BUN 14 11/08/2019   CREATININE 1.11 (H) 11/08/2019   BILITOT 0.5 11/08/2019   ALKPHOS 64 09/14/2015   AST 25 11/08/2019   ALT 19 11/08/2019   PROT 6.5 11/08/2019   ALBUMIN 2.7 (L) 09/15/2015   CALCIUM 9.2 11/08/2019   ANIONGAP 9 09/16/2015   Lab Results  Component Value Date   CHOL 164 01/11/2019   Lab Results  Component Value Date   HDL 46 (L) 01/11/2019   Lab Results  Component Value Date   LDLCALC 91 01/11/2019   Lab Results  Component  Value Date   TRIG 175 (H) 01/11/2019   Lab Results  Component Value Date   CHOLHDL 3.6 01/11/2019   No results found for: HGBA1C    Assessment & Plan:   Problem List Items Addressed This Visit      Cardiovascular and Mediastinum   Ocular migraine    Uses her Fiorinal prn.        Relevant Medications   butalbital-aspirin-caffeine (FIORINAL) 50-325-40 MG capsule   Essential hypertension, benign    Well controlled. Continue current regimen. Follow up in  6 mo      Relevant Orders   BASIC METABOLIC PANEL WITH GFR   Lipid panel   Atrial fibrillation with RVR (HCC)    Able.  Rate is well controlled today on atenolol.  Continue with Eliquis.  She does have some bruising.        Endocrine   Hypothyroidism - Primary    Symptoms are currently stable.  Due to recheck TSH.      Relevant Orders   TSH     Genitourinary   CKD (chronic kidney disease) stage 3, GFR 30-59 ml/min (HCC)   Relevant Orders   BASIC METABOLIC PANEL WITH GFR     Other   Hormone replacement therapy (HRT)      Declined pneumonia vaccine and flu vaccine today.  Meds ordered this encounter  Medications  . butalbital-aspirin-caffeine (FIORINAL) 50-325-40 MG capsule    Sig: TAKE ONE CAP BY MOUTH EVERY 4 HOURS AS NEEDED  FOR OCCULAR MIGRAINE    Dispense:  30 capsule    Refill:  0    Pt will call when needed    Follow-up: Return in about 6 months (around 11/12/2020) for Hypertension and Hypothyroidism. Nani Gasser, MD

## 2020-05-15 NOTE — Assessment & Plan Note (Signed)
Able.  Rate is well controlled today on atenolol.  Continue with Eliquis.  She does have some bruising.

## 2020-05-15 NOTE — Assessment & Plan Note (Signed)
Uses her Fiorinal prn.

## 2020-05-17 ENCOUNTER — Telehealth: Payer: Self-pay | Admitting: Family Medicine

## 2020-05-17 NOTE — Telephone Encounter (Signed)
Pt returned call and I gave her her lab results.  She stated she also did bloodwork for her thyroid and wants to know what her thyroid levels are. She also needs a refill on her thyroid med.   Thank you.

## 2020-05-21 ENCOUNTER — Telehealth: Payer: Self-pay | Admitting: Family Medicine

## 2020-05-21 DIAGNOSIS — I4891 Unspecified atrial fibrillation: Secondary | ICD-10-CM

## 2020-05-21 MED ORDER — APIXABAN 5 MG PO TABS: ORAL_TABLET | ORAL | 2 refills | Status: AC

## 2020-05-21 NOTE — Telephone Encounter (Signed)
Patient called into office for refill on her Eliquis.  Patient had follow-up appointment on 05/15/20.  [facility] [place]   ELIQUIS 5 MG TABS tablet [[id number]]   Order Details Dose, Route, Frequency: As Directed  Dispense Quantity: 180 tablet Refills: 0   Note to Pharmacy: No refills. Needs a f/u appt before next refill.      Sig: TAKE 1 TABLET (5 MG TOTAL) BY MOUTH 2 (TWO) TIMES DAILY.      Start Date: 02/15/20 End Date: --  Written Date: 02/15/20 Expiration Date: 02/14/21  Original Order:  Everlene Balls 5 MG TABS tablet [[id number]]   Providers  Ordering and Authorizing Provider:   Agapito Games, MD  [address], [place] [address]  Phone:  [phone]  Fax:  [phone]  DEA #:  [id number]  NPI:  [id number]     Ordering User:  Delfino Lovett, CMA       Pharmacy  [id number], [place] - [address]  [address] [place] [address]  Phone:  [phone] Fax:  [phone]  DEA #:  [id number]  DAW Reason: --

## 2020-05-21 NOTE — Telephone Encounter (Signed)
Patient received TSH results and refill on her thyroid medication.

## 2020-05-27 ENCOUNTER — Other Ambulatory Visit: Payer: Self-pay | Admitting: Family Medicine

## 2020-06-12 ENCOUNTER — Other Ambulatory Visit: Payer: Self-pay

## 2020-06-12 DIAGNOSIS — Z79890 Hormone replacement therapy (postmenopausal): Secondary | ICD-10-CM

## 2020-06-12 MED ORDER — AMBULATORY NON FORMULARY MEDICATION: 11 refills | Status: DC

## 2020-06-12 NOTE — Telephone Encounter (Signed)
Deven called and left a message stating she needs her estradiol and progesterone compounds sent to [facility]. I did pend a prescription for Bi-est.

## 2020-07-07 ENCOUNTER — Other Ambulatory Visit: Payer: Self-pay | Admitting: Family Medicine

## 2020-07-09 ENCOUNTER — Other Ambulatory Visit: Payer: Self-pay

## 2020-07-09 ENCOUNTER — Telehealth: Payer: Self-pay | Admitting: Family Medicine

## 2020-07-09 DIAGNOSIS — N1831 Chronic kidney disease, stage 3a: Secondary | ICD-10-CM

## 2020-07-09 MED ORDER — FUROSEMIDE 40 MG PO TABS: 40.0000 mg | ORAL_TABLET | Freq: Every day | ORAL | 1 refills | Status: DC | PRN

## 2020-07-09 MED ORDER — FUROSEMIDE 40 MG PO TABS: 40.0000 mg | ORAL_TABLET | Freq: Every day | ORAL | 0 refills | Status: DC | PRN

## 2020-07-09 NOTE — Telephone Encounter (Signed)
Miss Rachel Keith called asking for refill on Lasix. She stated that she has about 3 days worth left. [facility] [place] [facility]

## 2020-07-09 NOTE — Telephone Encounter (Signed)
Refill for Lasix sent to pharmacy. Reminded pt to keep upcoming appt's for further refills.

## 2020-08-16 ENCOUNTER — Telehealth: Payer: Self-pay | Admitting: *Deleted

## 2020-08-16 DIAGNOSIS — E032 Hypothyroidism due to medicaments and other exogenous substances: Secondary | ICD-10-CM

## 2020-08-16 NOTE — Telephone Encounter (Signed)
Pt called and stated that her hair is falling out again and she believes that this is due to the Armour thyroid 90 mg that she is currently taking. She said that her hair has been falling out for months now and would like to start taking a generic or switch to something else.   She said that she has also had a lot going lately.  Her last TSH was done on 05/15/20 it was 0.50.   Pt advised that she would possibly need to have labs done prior or once she has been on another thyroid medication.   Will fwd to pcp for recommendations.

## 2020-08-17 MED ORDER — LEVOTHYROXINE SODIUM 112 MCG PO TABS: 112.0000 ug | ORAL_TABLET | Freq: Every day | ORAL | 1 refills | Status: DC

## 2020-08-17 NOTE — Telephone Encounter (Signed)
Spoke to pt and gave her recommendations. Lab ordered.

## 2020-08-17 NOTE — Telephone Encounter (Signed)
Okay, we can change back to levothyroxine which is generic.  [facility] she will need to go for TSH in about 6 weeks.

## 2020-09-03 ENCOUNTER — Other Ambulatory Visit: Payer: Self-pay | Admitting: Family Medicine

## 2020-09-03 NOTE — Telephone Encounter (Signed)
Please call patient and let her know that I did refill the diazepam but I refilled it for the 5 mg tabs instead of the 10 mg tabs.  Based on her age it is really not safe to use this medication frequently or long-term so we need to start working on decreasing her dose I think when she was here before she told me that a lot of time she does not up splitting the tab so this should be a fairly easy transition

## 2020-09-06 ENCOUNTER — Telehealth: Payer: Self-pay

## 2020-09-06 NOTE — Telephone Encounter (Signed)
PA sent for diazepam (VALIUM) 5 MG tablet, awaiting response.

## 2020-09-09 ENCOUNTER — Other Ambulatory Visit: Payer: Self-pay | Admitting: Family Medicine

## 2020-09-10 DIAGNOSIS — E032 Hypothyroidism due to medicaments and other exogenous substances: Secondary | ICD-10-CM | POA: Diagnosis not present

## 2020-09-10 LAB — TSH: TSH: 1.81 mIU/L (ref 0.40–4.50)

## 2020-09-11 NOTE — Addendum Note (Signed)
Addended by: Deno Etienne on: 09/11/2020 08:38 AM   Modules accepted: Orders

## 2020-09-20 ENCOUNTER — Encounter: Payer: Self-pay | Admitting: Family Medicine

## 2020-09-20 ENCOUNTER — Telehealth: Payer: Self-pay | Admitting: Family Medicine

## 2020-09-20 ENCOUNTER — Other Ambulatory Visit: Payer: Self-pay

## 2020-09-20 ENCOUNTER — Ambulatory Visit (INDEPENDENT_AMBULATORY_CARE_PROVIDER_SITE_OTHER): Payer: Medicare HMO | Admitting: Family Medicine

## 2020-09-20 VITALS — BP 133/89 | HR 101 | Ht 62.0 in | Wt 131.0 lb

## 2020-09-20 DIAGNOSIS — L659 Nonscarring hair loss, unspecified: Secondary | ICD-10-CM

## 2020-09-20 DIAGNOSIS — N1831 Chronic kidney disease, stage 3a: Secondary | ICD-10-CM

## 2020-09-20 DIAGNOSIS — I071 Rheumatic tricuspid insufficiency: Secondary | ICD-10-CM

## 2020-09-20 DIAGNOSIS — R69 Illness, unspecified: Secondary | ICD-10-CM | POA: Diagnosis not present

## 2020-09-20 DIAGNOSIS — F419 Anxiety disorder, unspecified: Secondary | ICD-10-CM

## 2020-09-20 DIAGNOSIS — Z79890 Hormone replacement therapy (postmenopausal): Secondary | ICD-10-CM

## 2020-09-20 DIAGNOSIS — G43109 Migraine with aura, not intractable, without status migrainosus: Secondary | ICD-10-CM

## 2020-09-20 MED ORDER — FUROSEMIDE 20 MG PO TABS: 20.0000 mg | ORAL_TABLET | Freq: Every day | ORAL | 3 refills | Status: AC

## 2020-09-20 MED ORDER — BUTALBITAL-ASPIRIN-CAFFEINE 50-325-40 MG PO CAPS: ORAL_CAPSULE | ORAL | 0 refills | Status: AC

## 2020-09-20 NOTE — Assessment & Plan Note (Signed)
TSH about 4 weks ago was great at 1.8  Lab Results  Component Value Date   TSH 1.81 09/10/2020

## 2020-09-20 NOTE — Assessment & Plan Note (Signed)
Not using Rogaine.  Doing well on supplement. Off HRT.

## 2020-09-20 NOTE — Assessment & Plan Note (Signed)
Follow funciton Q 6 mo

## 2020-09-20 NOTE — Progress Notes (Signed)
Established Patient Office Visit  Subjective:  Patient ID: Rachel Keith, female    DOB: 1940-12-28  Age: 80 y.o. MRN: [id number]  CC:  Chief Complaint  Patient presents with  . Follow-up    HPI Rachel Keith presents for   F/U anxiety - she feels  The dec diazepam dose is not working as well as the 10mg  dose.  We had dec it to 5mg  about 6 mo ago.  She would like to go back up.    Hair loss - she is doing well. She came off all her hormone supplements and has been taking a Skin, Hair and Nails supplement and another called Viviscal.  '  Hypothyroidism - Taking medication regularly in the AM away from food and vitamins, etc. No recent change to skin, hair, or energy levels.  Has been taking a long nap in the afternoons, but reports still sleeping well at night and also feels very energetic and alert at night.    She also let me know she has been taking 1/2 tab of her lasix daily.    Past Medical History:  Diagnosis Date  . Anemia   . Anxiety   . Atrial fibrillation with RVR (HCC)   . Blood in the urine   . Chronic kidney disease (CKD), stage III (moderate) (HCC)    05/28/2015  . High cholesterol   . History of blood transfusion 08/2015   "fell and split my head open; got 2 units"  . Hypertension   . Hypothyroidism   . Nephrolithiasis     Past Surgical History:  Procedure Laterality Date  . ABDOMINAL HYSTERECTOMY    . ANKLE FRACTURE SURGERY Left ~ 2006  . APPENDECTOMY    . CARDIOVERSION  2017   2007 07/26/2015  . CATARACT EXTRACTION W/ INTRAOCULAR LENS  IMPLANT, BILATERAL Bilateral 09/06/2010  . CYSTOSCOPY W/ STONE MANIPULATION  06/2015 X 2  . FINGER SURGERY Right    "had bone growing out on the side of little finger"  . FRACTURE SURGERY    . LAPAROSCOPIC CHOLECYSTECTOMY    . RIGHT/LEFT HEART CATH AND CORONARY ANGIOGRAPHY N/A 03/23/2019   Procedure: RIGHT/LEFT HEART CATH AND CORONARY ANGIOGRAPHY;  Surgeon: 07/2015, MD;  Location: [facility];  Service: Cardiovascular;  Laterality: N/A;  . TEE WITHOUT CARDIOVERSION N/A 10/04/2015   Procedure: TRANSESOPHAGEAL ECHOCARDIOGRAM (TEE);  Surgeon: Kathleene Hazel, MD;  Location: [facility] ENDOSCOPY;  Service: Cardiovascular;  Laterality: N/A;  . TEE WITHOUT CARDIOVERSION N/A 03/23/2019   Procedure: TRANSESOPHAGEAL ECHOCARDIOGRAM (TEE);  Surgeon: [facility], MD;  Location: [facility] ENDOSCOPY;  Service: Cardiovascular;  Laterality: N/A;  . TONSILLECTOMY      Family History  Problem Relation Age of Onset  . Alcohol abuse Mother   . Liver disease Mother   . Alcohol abuse Father     Social History   Socioeconomic History  . Marital status: Divorced    Spouse name: Not on file  . Number of children: 4  . Years of education: Not on file  . Highest education level: Not on file  Occupational History  . Occupation: retired  Tobacco Use  . Smoking status: Never Smoker  . Smokeless tobacco: Never Used  Vaping Use  . Vaping Use: Never used  Substance and Sexual Activity  . Alcohol use: Yes    Alcohol/week: 0.0 standard drinks    Comment: 09/13/2015 "might have 1 drink/year"  . Drug use: No  . Sexual activity: Never  Other Topics  Concern  . Not on file  Social History Narrative   1 caffeinated drink daily    Social Determinants of Health   Financial Resource Strain: Not on file  Food Insecurity: Not on file  Transportation Needs: Not on file  Physical Activity: Not on file  Stress: Not on file  Social Connections: Not on file  Intimate Partner Violence: Not on file    Outpatient Medications Prior to Visit  Medication Sig Dispense Refill  . apixaban (ELIQUIS) 5 MG TABS tablet TAKE 1 TABLET (5 MG TOTAL) BY MOUTH 2 (TWO) TIMES DAILY. 180 tablet 2  . atenolol (TENORMIN) 50 MG tablet Take 1 tablet (50 mg total) by mouth 2 (two) times daily. 180 tablet 1  . B Complex Vitamins (B COMPLEX PO) Take 1 tablet by mouth daily.    . Biotin 5 MG TABS Take 1 tablet by mouth daily.    . Black  Pepper-Turmeric (TURMERIC COMPLEX/BLACK PEPPER PO) Take 2,000 mg by mouth 2 (two) times daily.    Marland Kitchen levothyroxine (SYNTHROID) 112 MCG tablet TAKE 1 TABLET BY MOUTH DAILY BEFORE BREAKFAST. 30 tablet 5  . Omega-3 Fatty Acids (FISH OIL) 1000 MG CAPS Take 1 capsule by mouth daily.    . Selenium 200 MCG TABS Take 1 tablet by mouth daily.    Marland Kitchen tretinoin (RETIN-A) 0.025 % cream Apply topically at bedtime as needed. 45 g 1  . butalbital-aspirin-caffeine (FIORINAL) 50-325-40 MG capsule TAKE ONE CAP BY MOUTH EVERY 4 HOURS AS NEEDED FOR OCCULAR MIGRAINE 30 capsule 0  . diazepam (VALIUM) 5 MG tablet Take 0.5-1 tablets (2.5-5 mg total) by mouth daily as needed. for anxiety (Patient not taking: Reported on 09/20/2020) 90 tablet 0  . AMBULATORY NON FORMULARY MEDICATION Medication Name: Bi-est topical for HRT through [facility] 1 vial 11  . furosemide (LASIX) 40 MG tablet Take 1 tablet (40 mg total) by mouth daily as needed. Keep upcoming appt for future refills ./cy 90 tablet 0  . sertraline (ZOLOFT) 50 MG tablet Take 1 tablet (50 mg total) by mouth daily. 90 tablet 1  . TURMERIC CURCUMIN PO Take 2,000 mg by mouth in the morning and at bedtime.     No facility-administered medications prior to visit.    Allergies  Allergen Reactions  . Amlodipine Other (See Comments)    Didn't feel well on 2.5 mg dose.  Didn't feel well on 2.5 mg dose.   Marland Kitchen Lisinopril Other (See Comments)    Hair Loss  . Sulfa Antibiotics Rash    Broke out in her joints one time as a teenager  . Sulfacetamide Sodium Rash    Broke out in her joints one time as a teenager    ROS Review of Systems    Objective:    Physical Exam Constitutional:      Appearance: She is well-developed.  HENT:     Head: Normocephalic and atraumatic.  Cardiovascular:     Rate and Rhythm: Normal rate and regular rhythm.     Heart sounds: Normal heart sounds.  Pulmonary:     Effort: Pulmonary effort is normal.     Breath sounds: Normal breath  sounds.  Musculoskeletal:     Cervical back: Neck supple.  Lymphadenopathy:     Cervical: No cervical adenopathy.  Skin:    General: Skin is warm and dry.  Neurological:     Mental Status: She is alert and oriented to person, place, and time.  Psychiatric:  Behavior: Behavior normal.     BP 133/89   Pulse (!) 101   Ht 5\' 2"  (1.575 m)   Wt 131 lb (59.4 kg)   SpO2 96%   BMI 23.96 kg/m  Wt Readings from Last 3 Encounters:  09/20/20 131 lb (59.4 kg)  05/15/20 131 lb 4.8 oz (59.6 kg)  11/03/19 139 lb (63 kg)     Health Maintenance Due  Topic Date Due  . DEXA SCAN  Never done    There are no preventive care reminders to display for this patient.  Lab Results  Component Value Date   TSH 1.81 09/10/2020   Lab Results  Component Value Date   WBC 9.0 11/08/2019   HGB 14.3 11/08/2019   HCT 42.1 11/08/2019   MCV 98.8 11/08/2019   PLT 138 (L) 11/08/2019   Lab Results  Component Value Date   NA 142 05/15/2020   K 3.5 05/15/2020   CO2 29 05/15/2020   GLUCOSE 89 05/15/2020   BUN 17 05/15/2020   CREATININE 1.03 (H) 05/15/2020   BILITOT 0.5 11/08/2019   ALKPHOS 64 09/14/2015   AST 25 11/08/2019   ALT 19 11/08/2019   PROT 6.5 11/08/2019   ALBUMIN 2.7 (L) 09/15/2015   CALCIUM 9.7 05/15/2020   ANIONGAP 9 09/16/2015   Lab Results  Component Value Date   CHOL 134 05/15/2020   Lab Results  Component Value Date   HDL 37 (L) 05/15/2020   Lab Results  Component Value Date   LDLCALC 70 05/15/2020   Lab Results  Component Value Date   TRIG 194 (H) 05/15/2020   Lab Results  Component Value Date   CHOLHDL 3.6 05/15/2020   No results found for: HGBA1C    Assessment & Plan:   Problem List Items Addressed This Visit      Cardiovascular and Mediastinum   Ocular migraine    RF sent. Stable.  No recent flares.        Relevant Medications   furosemide (LASIX) 20 MG tablet   butalbital-aspirin-caffeine (FIORINAL) 50-325-40 MG capsule      Genitourinary   CKD (chronic kidney disease) stage 3, GFR 30-59 ml/min (HCC)    Follow funciton Q 6 mo       Relevant Orders   COMPLETE METABOLIC PANEL WITH GFR     Other   Hormone replacement therapy (HRT)    Now off of HRT.        Hair loss    Not using Rogaine.  Doing well on supplement. Off HRT.       Anxiety - Primary      Meds ordered this encounter  Medications  . furosemide (LASIX) 20 MG tablet    Sig: Take 1 tablet (20 mg total) by mouth daily.    Dispense:  90 tablet    Refill:  3  . butalbital-aspirin-caffeine (FIORINAL) 50-325-40 MG capsule    Sig: TAKE ONE CAP BY MOUTH EVERY 4 HOURS AS NEEDED FOR OCCULAR MIGRAINE    Dispense:  30 capsule    Refill:  0    Pt will call when needed    Follow-up: Return in about 6 months (around 03/23/2021) for Hypertension.    13/04/2021, MD

## 2020-09-20 NOTE — Assessment & Plan Note (Signed)
RF sent. Stable.  No recent flares.

## 2020-09-20 NOTE — Assessment & Plan Note (Signed)
Due for repeat Echo this fall.

## 2020-09-20 NOTE — Assessment & Plan Note (Signed)
Now off of HRT.

## 2020-09-20 NOTE — Telephone Encounter (Signed)
Call pt and teel her due for labs in July.  Also when I see her back in 6 months she will be due for an updated echo for her heart.

## 2020-09-21 NOTE — Telephone Encounter (Signed)
Called pt and advised her to have labs drawn in July and that she will be due for an echo in 6 months.

## 2020-09-21 NOTE — Telephone Encounter (Signed)
LVM advised of needed labwork and getting an updated echo. Told to call back if any questions.

## 2020-10-10 ENCOUNTER — Telehealth: Payer: Self-pay | Admitting: Family Medicine

## 2020-10-10 NOTE — Progress Notes (Signed)
  Chronic Care Management   Outreach Note  10/10/2020 Name: Rachel Keith MRN: [id number] DOB: 02-Oct-1940  Referred by: Agapito Games, MD Reason for referral : No chief complaint on file.   A second unsuccessful telephone outreach was attempted today. The patient was referred to pharmacist for assistance with care management and care coordination.  Follow Up Plan:   Carmell Austria Upstream Scheduler

## 2020-10-10 NOTE — Chronic Care Management (AMB) (Signed)
  Chronic Care Management   Note  10/10/2020 Name: Rachel Keith MRN: [id number] DOB: 10/21/1940  Rachel Keith is a 80 y.o. year old female who is a primary care patient of Metheney, Barbarann Ehlers, MD. I reached out to Evette Doffing by phone today in response to a referral sent by [name] PCP, Agapito Games, MD.   [name] was given information about Chronic Care Management services today including:  1. CCM service includes personalized support from designated clinical staff supervised by her physician, including individualized plan of care and coordination with other care providers 2. 24/7 contact phone numbers for assistance for urgent and routine care needs. 3. Service will only be billed when office clinical staff spend 20 minutes or more in a month to coordinate care. 4. Only one practitioner may furnish and bill the service in a calendar month. 5. The patient may stop CCM services at any time (effective at the end of the month) by phone call to the office staff.   Patient did not agree to enrollment in care management services and does not wish to consider at this time.  Follow up plan:   Carmell Austria Upstream Scheduler

## 2020-11-02 DIAGNOSIS — N1831 Chronic kidney disease, stage 3a: Secondary | ICD-10-CM | POA: Diagnosis not present

## 2020-11-02 DIAGNOSIS — E032 Hypothyroidism due to medicaments and other exogenous substances: Secondary | ICD-10-CM | POA: Diagnosis not present

## 2020-11-02 LAB — TSH: TSH: 0.26 mIU/L — ABNORMAL LOW (ref 0.40–4.50)

## 2020-11-03 LAB — COMPLETE METABOLIC PANEL WITH GFR
AG Ratio: 1.4 (calc) (ref 1.0–2.5)
ALT: 17 U/L (ref 6–29)
AST: 26 U/L (ref 10–35)
Albumin: 4.2 g/dL (ref 3.6–5.1)
Alkaline phosphatase (APISO): 99 U/L (ref 37–153)
BUN/Creatinine Ratio: 15 (calc) (ref 6–22)
BUN: 17 mg/dL (ref 7–25)
CO2: 30 mmol/L (ref 20–32)
Calcium: 9.8 mg/dL (ref 8.6–10.4)
Chloride: 105 mmol/L (ref 98–110)
Creat: 1.14 mg/dL — ABNORMAL HIGH (ref 0.60–0.88)
GFR, Est African American: 53 mL/min/{1.73_m2} — ABNORMAL LOW (ref >=60)
GFR, Est Non African American: 45 mL/min/{1.73_m2} — ABNORMAL LOW (ref >=60)
Globulin: 2.9 g/dL (calc) (ref 1.9–3.7)
Glucose, Bld: 96 mg/dL (ref 65–99)
Potassium: 4.6 mmol/L (ref 3.5–5.3)
Sodium: 143 mmol/L (ref 135–146)
Total Bilirubin: 0.4 mg/dL (ref 0.2–1.2)
Total Protein: 7.1 g/dL (ref 6.1–8.1)

## 2020-11-06 ENCOUNTER — Other Ambulatory Visit: Payer: Self-pay | Admitting: *Deleted

## 2020-11-06 DIAGNOSIS — E038 Other specified hypothyroidism: Secondary | ICD-10-CM

## 2020-11-06 DIAGNOSIS — E032 Hypothyroidism due to medicaments and other exogenous substances: Secondary | ICD-10-CM

## 2021-01-29 DIAGNOSIS — Z79899 Other long term (current) drug therapy: Secondary | ICD-10-CM | POA: Diagnosis not present

## 2021-01-29 DIAGNOSIS — Z882 Allergy status to sulfonamides status: Secondary | ICD-10-CM | POA: Diagnosis not present

## 2021-01-29 DIAGNOSIS — Z87442 Personal history of urinary calculi: Secondary | ICD-10-CM | POA: Diagnosis not present

## 2021-01-29 DIAGNOSIS — L237 Allergic contact dermatitis due to plants, except food: Secondary | ICD-10-CM | POA: Diagnosis not present

## 2021-01-29 DIAGNOSIS — Z8679 Personal history of other diseases of the circulatory system: Secondary | ICD-10-CM | POA: Diagnosis not present

## 2021-01-29 DIAGNOSIS — L255 Unspecified contact dermatitis due to plants, except food: Secondary | ICD-10-CM | POA: Diagnosis not present

## 2021-01-29 DIAGNOSIS — E039 Hypothyroidism, unspecified: Secondary | ICD-10-CM | POA: Diagnosis not present

## 2021-01-29 DIAGNOSIS — R21 Rash and other nonspecific skin eruption: Secondary | ICD-10-CM | POA: Diagnosis not present

## 2021-01-29 DIAGNOSIS — I10 Essential (primary) hypertension: Secondary | ICD-10-CM | POA: Diagnosis not present

## 2021-01-29 DIAGNOSIS — Z7902 Long term (current) use of antithrombotics/antiplatelets: Secondary | ICD-10-CM | POA: Diagnosis not present

## 2021-01-29 DIAGNOSIS — Z888 Allergy status to other drugs, medicaments and biological substances status: Secondary | ICD-10-CM | POA: Diagnosis not present

## 2021-02-07 DIAGNOSIS — N951 Menopausal and female climacteric states: Secondary | ICD-10-CM | POA: Diagnosis not present

## 2021-02-12 DIAGNOSIS — E559 Vitamin D deficiency, unspecified: Secondary | ICD-10-CM | POA: Diagnosis not present

## 2021-02-12 DIAGNOSIS — Z131 Encounter for screening for diabetes mellitus: Secondary | ICD-10-CM | POA: Diagnosis not present

## 2021-02-12 DIAGNOSIS — E7800 Pure hypercholesterolemia, unspecified: Secondary | ICD-10-CM | POA: Diagnosis not present

## 2021-02-12 DIAGNOSIS — Z79899 Other long term (current) drug therapy: Secondary | ICD-10-CM | POA: Diagnosis not present

## 2021-02-12 DIAGNOSIS — E039 Hypothyroidism, unspecified: Secondary | ICD-10-CM | POA: Diagnosis not present

## 2021-02-12 DIAGNOSIS — D539 Nutritional anemia, unspecified: Secondary | ICD-10-CM | POA: Diagnosis not present

## 2021-03-10 ENCOUNTER — Other Ambulatory Visit: Payer: Self-pay | Admitting: Family Medicine

## 2021-03-19 DIAGNOSIS — Z87310 Personal history of (healed) osteoporosis fracture: Secondary | ICD-10-CM | POA: Diagnosis not present

## 2021-03-19 DIAGNOSIS — Z8781 Personal history of (healed) traumatic fracture: Secondary | ICD-10-CM | POA: Diagnosis not present

## 2021-03-19 DIAGNOSIS — E7800 Pure hypercholesterolemia, unspecified: Secondary | ICD-10-CM | POA: Diagnosis not present

## 2021-03-19 DIAGNOSIS — Z6824 Body mass index (BMI) 24.0-24.9, adult: Secondary | ICD-10-CM | POA: Diagnosis not present

## 2021-03-19 DIAGNOSIS — G43909 Migraine, unspecified, not intractable, without status migrainosus: Secondary | ICD-10-CM | POA: Diagnosis not present

## 2021-03-19 DIAGNOSIS — E039 Hypothyroidism, unspecified: Secondary | ICD-10-CM | POA: Diagnosis not present

## 2021-03-19 DIAGNOSIS — Z6823 Body mass index (BMI) 23.0-23.9, adult: Secondary | ICD-10-CM | POA: Diagnosis not present

## 2021-03-20 ENCOUNTER — Other Ambulatory Visit: Payer: Self-pay | Admitting: Family Medicine

## 2021-04-01 DIAGNOSIS — M810 Age-related osteoporosis without current pathological fracture: Secondary | ICD-10-CM | POA: Diagnosis not present

## 2021-04-01 DIAGNOSIS — Z1231 Encounter for screening mammogram for malignant neoplasm of breast: Secondary | ICD-10-CM | POA: Diagnosis not present

## 2021-04-01 DIAGNOSIS — Z87310 Personal history of (healed) osteoporosis fracture: Secondary | ICD-10-CM | POA: Diagnosis not present

## 2021-04-10 ENCOUNTER — Encounter: Payer: Self-pay | Admitting: Family Medicine

## 2021-04-10 DIAGNOSIS — R928 Other abnormal and inconclusive findings on diagnostic imaging of breast: Secondary | ICD-10-CM | POA: Diagnosis not present

## 2021-04-10 DIAGNOSIS — R922 Inconclusive mammogram: Secondary | ICD-10-CM | POA: Diagnosis not present

## 2021-04-16 DIAGNOSIS — E039 Hypothyroidism, unspecified: Secondary | ICD-10-CM | POA: Diagnosis not present

## 2021-04-16 DIAGNOSIS — Z8781 Personal history of (healed) traumatic fracture: Secondary | ICD-10-CM | POA: Diagnosis not present

## 2021-04-16 DIAGNOSIS — Z87310 Personal history of (healed) osteoporosis fracture: Secondary | ICD-10-CM | POA: Diagnosis not present

## 2021-04-16 DIAGNOSIS — Z6824 Body mass index (BMI) 24.0-24.9, adult: Secondary | ICD-10-CM | POA: Diagnosis not present

## 2021-04-16 DIAGNOSIS — E7800 Pure hypercholesterolemia, unspecified: Secondary | ICD-10-CM | POA: Diagnosis not present

## 2021-04-16 DIAGNOSIS — Z6823 Body mass index (BMI) 23.0-23.9, adult: Secondary | ICD-10-CM | POA: Diagnosis not present

## 2021-04-16 DIAGNOSIS — G43909 Migraine, unspecified, not intractable, without status migrainosus: Secondary | ICD-10-CM | POA: Diagnosis not present

## 2021-05-03 DIAGNOSIS — N281 Cyst of kidney, acquired: Secondary | ICD-10-CM | POA: Diagnosis not present

## 2021-05-03 DIAGNOSIS — N189 Chronic kidney disease, unspecified: Secondary | ICD-10-CM | POA: Diagnosis not present

## 2021-05-15 ENCOUNTER — Ambulatory Visit: Payer: Medicare HMO | Admitting: Family Medicine

## 2021-05-15 DIAGNOSIS — Z87310 Personal history of (healed) osteoporosis fracture: Secondary | ICD-10-CM | POA: Diagnosis not present

## 2021-05-15 DIAGNOSIS — R7303 Prediabetes: Secondary | ICD-10-CM | POA: Diagnosis not present

## 2021-05-15 DIAGNOSIS — R0602 Shortness of breath: Secondary | ICD-10-CM | POA: Diagnosis not present

## 2021-05-15 DIAGNOSIS — G43909 Migraine, unspecified, not intractable, without status migrainosus: Secondary | ICD-10-CM | POA: Diagnosis not present

## 2021-05-15 DIAGNOSIS — E7800 Pure hypercholesterolemia, unspecified: Secondary | ICD-10-CM | POA: Diagnosis not present

## 2021-05-15 DIAGNOSIS — E039 Hypothyroidism, unspecified: Secondary | ICD-10-CM | POA: Diagnosis not present

## 2021-05-15 DIAGNOSIS — Z79899 Other long term (current) drug therapy: Secondary | ICD-10-CM | POA: Diagnosis not present

## 2021-05-15 DIAGNOSIS — E559 Vitamin D deficiency, unspecified: Secondary | ICD-10-CM | POA: Diagnosis not present

## 2021-05-15 DIAGNOSIS — Z6824 Body mass index (BMI) 24.0-24.9, adult: Secondary | ICD-10-CM | POA: Diagnosis not present

## 2021-05-15 DIAGNOSIS — Z20822 Contact with and (suspected) exposure to covid-19: Secondary | ICD-10-CM | POA: Diagnosis not present

## 2021-05-15 DIAGNOSIS — Z8781 Personal history of (healed) traumatic fracture: Secondary | ICD-10-CM | POA: Diagnosis not present

## 2021-06-17 ENCOUNTER — Other Ambulatory Visit: Payer: Self-pay | Admitting: Family Medicine

## 2021-06-17 DIAGNOSIS — N1831 Chronic kidney disease, stage 3a: Secondary | ICD-10-CM

## 2021-06-17 DIAGNOSIS — I4891 Unspecified atrial fibrillation: Secondary | ICD-10-CM

## 2021-07-09 DIAGNOSIS — I5031 Acute diastolic (congestive) heart failure: Secondary | ICD-10-CM | POA: Diagnosis not present

## 2021-07-09 DIAGNOSIS — E039 Hypothyroidism, unspecified: Secondary | ICD-10-CM | POA: Diagnosis not present

## 2021-07-09 DIAGNOSIS — N1832 Chronic kidney disease, stage 3b: Secondary | ICD-10-CM | POA: Diagnosis not present

## 2021-07-09 DIAGNOSIS — E2839 Other primary ovarian failure: Secondary | ICD-10-CM | POA: Diagnosis not present

## 2021-07-09 DIAGNOSIS — F419 Anxiety disorder, unspecified: Secondary | ICD-10-CM | POA: Diagnosis not present

## 2021-07-09 DIAGNOSIS — R0602 Shortness of breath: Secondary | ICD-10-CM | POA: Diagnosis not present

## 2021-07-09 DIAGNOSIS — J984 Other disorders of lung: Secondary | ICD-10-CM | POA: Diagnosis not present

## 2021-07-09 DIAGNOSIS — J90 Pleural effusion, not elsewhere classified: Secondary | ICD-10-CM | POA: Diagnosis not present

## 2021-07-09 DIAGNOSIS — Z6825 Body mass index (BMI) 25.0-25.9, adult: Secondary | ICD-10-CM | POA: Diagnosis not present

## 2021-07-09 DIAGNOSIS — Z9181 History of falling: Secondary | ICD-10-CM | POA: Diagnosis not present

## 2021-07-10 DIAGNOSIS — I517 Cardiomegaly: Secondary | ICD-10-CM | POA: Diagnosis not present

## 2021-07-10 DIAGNOSIS — I5189 Other ill-defined heart diseases: Secondary | ICD-10-CM | POA: Diagnosis not present

## 2021-07-10 DIAGNOSIS — I519 Heart disease, unspecified: Secondary | ICD-10-CM | POA: Diagnosis not present

## 2021-07-10 DIAGNOSIS — I5031 Acute diastolic (congestive) heart failure: Secondary | ICD-10-CM | POA: Diagnosis not present

## 2021-07-10 DIAGNOSIS — I083 Combined rheumatic disorders of mitral, aortic and tricuspid valves: Secondary | ICD-10-CM | POA: Diagnosis not present

## 2021-07-10 DIAGNOSIS — I371 Nonrheumatic pulmonary valve insufficiency: Secondary | ICD-10-CM | POA: Diagnosis not present

## 2021-07-10 DIAGNOSIS — J90 Pleural effusion, not elsewhere classified: Secondary | ICD-10-CM | POA: Diagnosis not present

## 2021-07-11 DIAGNOSIS — Z9181 History of falling: Secondary | ICD-10-CM | POA: Diagnosis not present

## 2021-07-11 DIAGNOSIS — N1832 Chronic kidney disease, stage 3b: Secondary | ICD-10-CM | POA: Diagnosis not present

## 2021-07-11 DIAGNOSIS — E2839 Other primary ovarian failure: Secondary | ICD-10-CM | POA: Diagnosis not present

## 2021-07-11 DIAGNOSIS — Z6825 Body mass index (BMI) 25.0-25.9, adult: Secondary | ICD-10-CM | POA: Diagnosis not present

## 2021-07-11 DIAGNOSIS — E039 Hypothyroidism, unspecified: Secondary | ICD-10-CM | POA: Diagnosis not present

## 2021-07-11 DIAGNOSIS — F419 Anxiety disorder, unspecified: Secondary | ICD-10-CM | POA: Diagnosis not present

## 2021-07-23 DIAGNOSIS — E039 Hypothyroidism, unspecified: Secondary | ICD-10-CM | POA: Diagnosis not present

## 2021-07-23 DIAGNOSIS — F419 Anxiety disorder, unspecified: Secondary | ICD-10-CM | POA: Diagnosis not present

## 2021-07-23 DIAGNOSIS — Z9181 History of falling: Secondary | ICD-10-CM | POA: Diagnosis not present

## 2021-07-23 DIAGNOSIS — E2839 Other primary ovarian failure: Secondary | ICD-10-CM | POA: Diagnosis not present

## 2021-07-23 DIAGNOSIS — N1832 Chronic kidney disease, stage 3b: Secondary | ICD-10-CM | POA: Diagnosis not present

## 2021-07-23 DIAGNOSIS — Z6825 Body mass index (BMI) 25.0-25.9, adult: Secondary | ICD-10-CM | POA: Diagnosis not present

## 2021-07-24 DIAGNOSIS — L659 Nonscarring hair loss, unspecified: Secondary | ICD-10-CM | POA: Diagnosis not present

## 2021-08-08 DIAGNOSIS — Z6825 Body mass index (BMI) 25.0-25.9, adult: Secondary | ICD-10-CM | POA: Diagnosis not present

## 2021-08-08 DIAGNOSIS — Z9181 History of falling: Secondary | ICD-10-CM | POA: Diagnosis not present

## 2021-08-08 DIAGNOSIS — F419 Anxiety disorder, unspecified: Secondary | ICD-10-CM | POA: Diagnosis not present

## 2021-08-08 DIAGNOSIS — E2839 Other primary ovarian failure: Secondary | ICD-10-CM | POA: Diagnosis not present

## 2021-08-08 DIAGNOSIS — N1832 Chronic kidney disease, stage 3b: Secondary | ICD-10-CM | POA: Diagnosis not present

## 2021-08-08 DIAGNOSIS — E039 Hypothyroidism, unspecified: Secondary | ICD-10-CM | POA: Diagnosis not present

## 2021-08-30 DIAGNOSIS — E039 Hypothyroidism, unspecified: Secondary | ICD-10-CM | POA: Diagnosis not present

## 2021-08-30 DIAGNOSIS — E2839 Other primary ovarian failure: Secondary | ICD-10-CM | POA: Diagnosis not present

## 2021-08-30 DIAGNOSIS — Z9181 History of falling: Secondary | ICD-10-CM | POA: Diagnosis not present

## 2021-08-30 DIAGNOSIS — N1832 Chronic kidney disease, stage 3b: Secondary | ICD-10-CM | POA: Diagnosis not present

## 2021-08-30 DIAGNOSIS — Z6825 Body mass index (BMI) 25.0-25.9, adult: Secondary | ICD-10-CM | POA: Diagnosis not present

## 2021-08-30 DIAGNOSIS — F419 Anxiety disorder, unspecified: Secondary | ICD-10-CM | POA: Diagnosis not present

## 2021-10-01 DIAGNOSIS — E039 Hypothyroidism, unspecified: Secondary | ICD-10-CM | POA: Diagnosis not present

## 2021-10-01 DIAGNOSIS — Z9181 History of falling: Secondary | ICD-10-CM | POA: Diagnosis not present

## 2021-10-01 DIAGNOSIS — F419 Anxiety disorder, unspecified: Secondary | ICD-10-CM | POA: Diagnosis not present

## 2021-10-01 DIAGNOSIS — E2839 Other primary ovarian failure: Secondary | ICD-10-CM | POA: Diagnosis not present

## 2021-10-01 DIAGNOSIS — N1832 Chronic kidney disease, stage 3b: Secondary | ICD-10-CM | POA: Diagnosis not present

## 2021-10-01 DIAGNOSIS — Z6825 Body mass index (BMI) 25.0-25.9, adult: Secondary | ICD-10-CM | POA: Diagnosis not present

## 2021-10-08 DIAGNOSIS — H524 Presbyopia: Secondary | ICD-10-CM | POA: Diagnosis not present

## 2021-10-08 DIAGNOSIS — H52209 Unspecified astigmatism, unspecified eye: Secondary | ICD-10-CM | POA: Diagnosis not present

## 2021-10-08 DIAGNOSIS — H5203 Hypermetropia, bilateral: Secondary | ICD-10-CM | POA: Diagnosis not present

## 2021-10-08 DIAGNOSIS — H1045 Other chronic allergic conjunctivitis: Secondary | ICD-10-CM | POA: Diagnosis not present

## 2021-10-21 DIAGNOSIS — Z9181 History of falling: Secondary | ICD-10-CM | POA: Diagnosis not present

## 2021-10-21 DIAGNOSIS — E039 Hypothyroidism, unspecified: Secondary | ICD-10-CM | POA: Diagnosis not present

## 2021-10-21 DIAGNOSIS — E7800 Pure hypercholesterolemia, unspecified: Secondary | ICD-10-CM | POA: Diagnosis not present

## 2021-10-21 DIAGNOSIS — N1832 Chronic kidney disease, stage 3b: Secondary | ICD-10-CM | POA: Diagnosis not present

## 2021-10-21 DIAGNOSIS — Z6825 Body mass index (BMI) 25.0-25.9, adult: Secondary | ICD-10-CM | POA: Diagnosis not present

## 2021-10-21 DIAGNOSIS — E2839 Other primary ovarian failure: Secondary | ICD-10-CM | POA: Diagnosis not present

## 2021-10-21 DIAGNOSIS — Z79899 Other long term (current) drug therapy: Secondary | ICD-10-CM | POA: Diagnosis not present

## 2021-10-21 DIAGNOSIS — F419 Anxiety disorder, unspecified: Secondary | ICD-10-CM | POA: Diagnosis not present

## 2021-10-21 DIAGNOSIS — E559 Vitamin D deficiency, unspecified: Secondary | ICD-10-CM | POA: Diagnosis not present

## 2021-11-05 DIAGNOSIS — E039 Hypothyroidism, unspecified: Secondary | ICD-10-CM | POA: Diagnosis not present

## 2021-11-05 DIAGNOSIS — F419 Anxiety disorder, unspecified: Secondary | ICD-10-CM | POA: Diagnosis not present

## 2021-11-05 DIAGNOSIS — Z9181 History of falling: Secondary | ICD-10-CM | POA: Diagnosis not present

## 2021-11-05 DIAGNOSIS — N1832 Chronic kidney disease, stage 3b: Secondary | ICD-10-CM | POA: Diagnosis not present

## 2021-11-05 DIAGNOSIS — E2839 Other primary ovarian failure: Secondary | ICD-10-CM | POA: Diagnosis not present

## 2021-11-05 DIAGNOSIS — Z6825 Body mass index (BMI) 25.0-25.9, adult: Secondary | ICD-10-CM | POA: Diagnosis not present

## 2021-12-17 DIAGNOSIS — J90 Pleural effusion, not elsewhere classified: Secondary | ICD-10-CM | POA: Diagnosis not present

## 2021-12-17 DIAGNOSIS — R7303 Prediabetes: Secondary | ICD-10-CM | POA: Diagnosis not present

## 2021-12-17 DIAGNOSIS — F419 Anxiety disorder, unspecified: Secondary | ICD-10-CM | POA: Diagnosis not present

## 2021-12-17 DIAGNOSIS — E2839 Other primary ovarian failure: Secondary | ICD-10-CM | POA: Diagnosis not present

## 2021-12-17 DIAGNOSIS — R2689 Other abnormalities of gait and mobility: Secondary | ICD-10-CM | POA: Diagnosis not present

## 2021-12-17 DIAGNOSIS — Z6825 Body mass index (BMI) 25.0-25.9, adult: Secondary | ICD-10-CM | POA: Diagnosis not present

## 2021-12-17 DIAGNOSIS — N1832 Chronic kidney disease, stage 3b: Secondary | ICD-10-CM | POA: Diagnosis not present

## 2021-12-17 DIAGNOSIS — M818 Other osteoporosis without current pathological fracture: Secondary | ICD-10-CM | POA: Diagnosis not present

## 2021-12-17 DIAGNOSIS — I4811 Longstanding persistent atrial fibrillation: Secondary | ICD-10-CM | POA: Diagnosis not present

## 2021-12-17 DIAGNOSIS — E039 Hypothyroidism, unspecified: Secondary | ICD-10-CM | POA: Diagnosis not present

## 2021-12-17 DIAGNOSIS — I4891 Unspecified atrial fibrillation: Secondary | ICD-10-CM | POA: Diagnosis not present

## 2021-12-17 DIAGNOSIS — L659 Nonscarring hair loss, unspecified: Secondary | ICD-10-CM | POA: Diagnosis not present

## 2022-03-19 DIAGNOSIS — J90 Pleural effusion, not elsewhere classified: Secondary | ICD-10-CM | POA: Diagnosis not present

## 2022-03-19 DIAGNOSIS — E7800 Pure hypercholesterolemia, unspecified: Secondary | ICD-10-CM | POA: Diagnosis not present

## 2022-03-19 DIAGNOSIS — Z6825 Body mass index (BMI) 25.0-25.9, adult: Secondary | ICD-10-CM | POA: Diagnosis not present

## 2022-03-19 DIAGNOSIS — F419 Anxiety disorder, unspecified: Secondary | ICD-10-CM | POA: Diagnosis not present

## 2022-03-19 DIAGNOSIS — Z79899 Other long term (current) drug therapy: Secondary | ICD-10-CM | POA: Diagnosis not present

## 2022-03-19 DIAGNOSIS — E559 Vitamin D deficiency, unspecified: Secondary | ICD-10-CM | POA: Diagnosis not present

## 2022-03-19 DIAGNOSIS — I10 Essential (primary) hypertension: Secondary | ICD-10-CM | POA: Diagnosis not present

## 2022-03-19 DIAGNOSIS — N1832 Chronic kidney disease, stage 3b: Secondary | ICD-10-CM | POA: Diagnosis not present

## 2022-03-19 DIAGNOSIS — I4811 Longstanding persistent atrial fibrillation: Secondary | ICD-10-CM | POA: Diagnosis not present

## 2022-03-19 DIAGNOSIS — R7303 Prediabetes: Secondary | ICD-10-CM | POA: Diagnosis not present

## 2022-03-19 DIAGNOSIS — E039 Hypothyroidism, unspecified: Secondary | ICD-10-CM | POA: Diagnosis not present

## 2022-03-19 DIAGNOSIS — L659 Nonscarring hair loss, unspecified: Secondary | ICD-10-CM | POA: Diagnosis not present

## 2022-03-19 DIAGNOSIS — E2839 Other primary ovarian failure: Secondary | ICD-10-CM | POA: Diagnosis not present

## 2022-03-19 DIAGNOSIS — R2689 Other abnormalities of gait and mobility: Secondary | ICD-10-CM | POA: Diagnosis not present

## 2022-03-19 DIAGNOSIS — M818 Other osteoporosis without current pathological fracture: Secondary | ICD-10-CM | POA: Diagnosis not present

## 2022-04-15 DIAGNOSIS — R7303 Prediabetes: Secondary | ICD-10-CM | POA: Diagnosis not present

## 2022-04-15 DIAGNOSIS — N1832 Chronic kidney disease, stage 3b: Secondary | ICD-10-CM | POA: Diagnosis not present

## 2022-04-15 DIAGNOSIS — E7800 Pure hypercholesterolemia, unspecified: Secondary | ICD-10-CM | POA: Diagnosis not present

## 2022-04-15 DIAGNOSIS — I10 Essential (primary) hypertension: Secondary | ICD-10-CM | POA: Diagnosis not present

## 2022-04-15 DIAGNOSIS — Z6825 Body mass index (BMI) 25.0-25.9, adult: Secondary | ICD-10-CM | POA: Diagnosis not present

## 2022-04-15 DIAGNOSIS — I4811 Longstanding persistent atrial fibrillation: Secondary | ICD-10-CM | POA: Diagnosis not present

## 2022-04-15 DIAGNOSIS — M818 Other osteoporosis without current pathological fracture: Secondary | ICD-10-CM | POA: Diagnosis not present

## 2022-04-15 DIAGNOSIS — E2839 Other primary ovarian failure: Secondary | ICD-10-CM | POA: Diagnosis not present

## 2022-04-15 DIAGNOSIS — E039 Hypothyroidism, unspecified: Secondary | ICD-10-CM | POA: Diagnosis not present

## 2022-04-15 DIAGNOSIS — L659 Nonscarring hair loss, unspecified: Secondary | ICD-10-CM | POA: Diagnosis not present

## 2022-04-15 DIAGNOSIS — R2689 Other abnormalities of gait and mobility: Secondary | ICD-10-CM | POA: Diagnosis not present

## 2022-04-15 DIAGNOSIS — F419 Anxiety disorder, unspecified: Secondary | ICD-10-CM | POA: Diagnosis not present

## 2022-06-18 DIAGNOSIS — H524 Presbyopia: Secondary | ICD-10-CM | POA: Diagnosis not present

## 2022-06-18 DIAGNOSIS — H5203 Hypermetropia, bilateral: Secondary | ICD-10-CM | POA: Diagnosis not present

## 2022-06-18 DIAGNOSIS — H52209 Unspecified astigmatism, unspecified eye: Secondary | ICD-10-CM | POA: Diagnosis not present

## 2022-08-06 DIAGNOSIS — L659 Nonscarring hair loss, unspecified: Secondary | ICD-10-CM | POA: Diagnosis not present

## 2022-08-06 DIAGNOSIS — Z79899 Other long term (current) drug therapy: Secondary | ICD-10-CM | POA: Diagnosis not present

## 2022-08-06 DIAGNOSIS — M818 Other osteoporosis without current pathological fracture: Secondary | ICD-10-CM | POA: Diagnosis not present

## 2022-08-06 DIAGNOSIS — Z6824 Body mass index (BMI) 24.0-24.9, adult: Secondary | ICD-10-CM | POA: Diagnosis not present

## 2022-08-06 DIAGNOSIS — E7800 Pure hypercholesterolemia, unspecified: Secondary | ICD-10-CM | POA: Diagnosis not present

## 2022-08-06 DIAGNOSIS — F419 Anxiety disorder, unspecified: Secondary | ICD-10-CM | POA: Diagnosis not present

## 2022-08-06 DIAGNOSIS — E039 Hypothyroidism, unspecified: Secondary | ICD-10-CM | POA: Diagnosis not present

## 2022-08-06 DIAGNOSIS — E559 Vitamin D deficiency, unspecified: Secondary | ICD-10-CM | POA: Diagnosis not present

## 2022-08-06 DIAGNOSIS — D539 Nutritional anemia, unspecified: Secondary | ICD-10-CM | POA: Diagnosis not present

## 2022-08-06 DIAGNOSIS — N1832 Chronic kidney disease, stage 3b: Secondary | ICD-10-CM | POA: Diagnosis not present

## 2022-08-06 DIAGNOSIS — E2839 Other primary ovarian failure: Secondary | ICD-10-CM | POA: Diagnosis not present

## 2022-08-06 DIAGNOSIS — I10 Essential (primary) hypertension: Secondary | ICD-10-CM | POA: Diagnosis not present

## 2022-08-06 DIAGNOSIS — I4811 Longstanding persistent atrial fibrillation: Secondary | ICD-10-CM | POA: Diagnosis not present

## 2022-08-06 DIAGNOSIS — R7303 Prediabetes: Secondary | ICD-10-CM | POA: Diagnosis not present

## 2022-08-12 DIAGNOSIS — F419 Anxiety disorder, unspecified: Secondary | ICD-10-CM | POA: Diagnosis not present

## 2022-08-12 DIAGNOSIS — N1832 Chronic kidney disease, stage 3b: Secondary | ICD-10-CM | POA: Diagnosis not present

## 2022-08-12 DIAGNOSIS — R7303 Prediabetes: Secondary | ICD-10-CM | POA: Diagnosis not present

## 2022-08-12 DIAGNOSIS — J90 Pleural effusion, not elsewhere classified: Secondary | ICD-10-CM | POA: Diagnosis not present

## 2022-08-12 DIAGNOSIS — M818 Other osteoporosis without current pathological fracture: Secondary | ICD-10-CM | POA: Diagnosis not present

## 2022-08-12 DIAGNOSIS — E039 Hypothyroidism, unspecified: Secondary | ICD-10-CM | POA: Diagnosis not present

## 2022-08-12 DIAGNOSIS — I4811 Longstanding persistent atrial fibrillation: Secondary | ICD-10-CM | POA: Diagnosis not present

## 2022-08-12 DIAGNOSIS — Z6824 Body mass index (BMI) 24.0-24.9, adult: Secondary | ICD-10-CM | POA: Diagnosis not present

## 2022-08-12 DIAGNOSIS — I4891 Unspecified atrial fibrillation: Secondary | ICD-10-CM | POA: Diagnosis not present

## 2022-08-12 DIAGNOSIS — E2839 Other primary ovarian failure: Secondary | ICD-10-CM | POA: Diagnosis not present

## 2022-08-12 DIAGNOSIS — E7800 Pure hypercholesterolemia, unspecified: Secondary | ICD-10-CM | POA: Diagnosis not present

## 2022-08-12 DIAGNOSIS — L659 Nonscarring hair loss, unspecified: Secondary | ICD-10-CM | POA: Diagnosis not present

## 2022-10-14 DIAGNOSIS — R7989 Other specified abnormal findings of blood chemistry: Secondary | ICD-10-CM | POA: Diagnosis not present

## 2022-10-14 DIAGNOSIS — I10 Essential (primary) hypertension: Secondary | ICD-10-CM | POA: Diagnosis not present

## 2022-10-14 DIAGNOSIS — Z6824 Body mass index (BMI) 24.0-24.9, adult: Secondary | ICD-10-CM | POA: Diagnosis not present

## 2022-10-14 DIAGNOSIS — E7800 Pure hypercholesterolemia, unspecified: Secondary | ICD-10-CM | POA: Diagnosis not present

## 2022-10-14 DIAGNOSIS — L659 Nonscarring hair loss, unspecified: Secondary | ICD-10-CM | POA: Diagnosis not present

## 2022-10-14 DIAGNOSIS — Z87310 Personal history of (healed) osteoporosis fracture: Secondary | ICD-10-CM | POA: Diagnosis not present

## 2022-10-14 DIAGNOSIS — E039 Hypothyroidism, unspecified: Secondary | ICD-10-CM | POA: Diagnosis not present

## 2022-10-14 DIAGNOSIS — R7303 Prediabetes: Secondary | ICD-10-CM | POA: Diagnosis not present

## 2022-10-14 DIAGNOSIS — N1832 Chronic kidney disease, stage 3b: Secondary | ICD-10-CM | POA: Diagnosis not present

## 2022-10-14 DIAGNOSIS — Z8781 Personal history of (healed) traumatic fracture: Secondary | ICD-10-CM | POA: Diagnosis not present

## 2022-10-14 DIAGNOSIS — M818 Other osteoporosis without current pathological fracture: Secondary | ICD-10-CM | POA: Diagnosis not present

## 2022-10-14 DIAGNOSIS — R4589 Other symptoms and signs involving emotional state: Secondary | ICD-10-CM | POA: Diagnosis not present

## 2022-12-24 DIAGNOSIS — E2839 Other primary ovarian failure: Secondary | ICD-10-CM | POA: Diagnosis not present

## 2022-12-24 DIAGNOSIS — R7303 Prediabetes: Secondary | ICD-10-CM | POA: Diagnosis not present

## 2022-12-24 DIAGNOSIS — I4891 Unspecified atrial fibrillation: Secondary | ICD-10-CM | POA: Diagnosis not present

## 2022-12-24 DIAGNOSIS — N1832 Chronic kidney disease, stage 3b: Secondary | ICD-10-CM | POA: Diagnosis not present

## 2022-12-24 DIAGNOSIS — E559 Vitamin D deficiency, unspecified: Secondary | ICD-10-CM | POA: Diagnosis not present

## 2022-12-24 DIAGNOSIS — Z6824 Body mass index (BMI) 24.0-24.9, adult: Secondary | ICD-10-CM | POA: Diagnosis not present

## 2022-12-24 DIAGNOSIS — E039 Hypothyroidism, unspecified: Secondary | ICD-10-CM | POA: Diagnosis not present

## 2022-12-24 DIAGNOSIS — M818 Other osteoporosis without current pathological fracture: Secondary | ICD-10-CM | POA: Diagnosis not present

## 2022-12-24 DIAGNOSIS — R0602 Shortness of breath: Secondary | ICD-10-CM | POA: Diagnosis not present

## 2022-12-24 DIAGNOSIS — F419 Anxiety disorder, unspecified: Secondary | ICD-10-CM | POA: Diagnosis not present

## 2022-12-24 DIAGNOSIS — E7800 Pure hypercholesterolemia, unspecified: Secondary | ICD-10-CM | POA: Diagnosis not present

## 2022-12-24 DIAGNOSIS — I10 Essential (primary) hypertension: Secondary | ICD-10-CM | POA: Diagnosis not present

## 2023-01-19 DIAGNOSIS — E2839 Other primary ovarian failure: Secondary | ICD-10-CM | POA: Diagnosis not present

## 2023-01-19 DIAGNOSIS — E039 Hypothyroidism, unspecified: Secondary | ICD-10-CM | POA: Diagnosis not present

## 2023-01-19 DIAGNOSIS — F419 Anxiety disorder, unspecified: Secondary | ICD-10-CM | POA: Diagnosis not present

## 2023-01-19 DIAGNOSIS — Z6824 Body mass index (BMI) 24.0-24.9, adult: Secondary | ICD-10-CM | POA: Diagnosis not present

## 2023-01-19 DIAGNOSIS — E7800 Pure hypercholesterolemia, unspecified: Secondary | ICD-10-CM | POA: Diagnosis not present

## 2023-01-19 DIAGNOSIS — I10 Essential (primary) hypertension: Secondary | ICD-10-CM | POA: Diagnosis not present

## 2023-01-19 DIAGNOSIS — M818 Other osteoporosis without current pathological fracture: Secondary | ICD-10-CM | POA: Diagnosis not present

## 2023-01-19 DIAGNOSIS — N1832 Chronic kidney disease, stage 3b: Secondary | ICD-10-CM | POA: Diagnosis not present

## 2023-01-19 DIAGNOSIS — E559 Vitamin D deficiency, unspecified: Secondary | ICD-10-CM | POA: Diagnosis not present

## 2023-01-19 DIAGNOSIS — I4891 Unspecified atrial fibrillation: Secondary | ICD-10-CM | POA: Diagnosis not present

## 2023-01-19 DIAGNOSIS — R7303 Prediabetes: Secondary | ICD-10-CM | POA: Diagnosis not present

## 2023-05-25 DIAGNOSIS — Z0000 Encounter for general adult medical examination without abnormal findings: Secondary | ICD-10-CM | POA: Diagnosis not present

## 2023-06-08 DIAGNOSIS — E7800 Pure hypercholesterolemia, unspecified: Secondary | ICD-10-CM | POA: Diagnosis not present

## 2023-06-08 DIAGNOSIS — E039 Hypothyroidism, unspecified: Secondary | ICD-10-CM | POA: Diagnosis not present

## 2023-06-08 DIAGNOSIS — N1832 Chronic kidney disease, stage 3b: Secondary | ICD-10-CM | POA: Diagnosis not present

## 2023-06-08 DIAGNOSIS — M818 Other osteoporosis without current pathological fracture: Secondary | ICD-10-CM | POA: Diagnosis not present

## 2023-06-08 DIAGNOSIS — I4891 Unspecified atrial fibrillation: Secondary | ICD-10-CM | POA: Diagnosis not present

## 2023-06-08 DIAGNOSIS — Z6823 Body mass index (BMI) 23.0-23.9, adult: Secondary | ICD-10-CM | POA: Diagnosis not present

## 2023-06-08 DIAGNOSIS — F419 Anxiety disorder, unspecified: Secondary | ICD-10-CM | POA: Diagnosis not present

## 2023-06-08 DIAGNOSIS — E559 Vitamin D deficiency, unspecified: Secondary | ICD-10-CM | POA: Diagnosis not present

## 2023-06-08 DIAGNOSIS — E2839 Other primary ovarian failure: Secondary | ICD-10-CM | POA: Diagnosis not present

## 2023-06-08 DIAGNOSIS — R7303 Prediabetes: Secondary | ICD-10-CM | POA: Diagnosis not present

## 2023-06-08 DIAGNOSIS — J329 Chronic sinusitis, unspecified: Secondary | ICD-10-CM | POA: Diagnosis not present

## 2023-06-08 DIAGNOSIS — I10 Essential (primary) hypertension: Secondary | ICD-10-CM | POA: Diagnosis not present

## 2023-09-08 DIAGNOSIS — I4891 Unspecified atrial fibrillation: Secondary | ICD-10-CM | POA: Diagnosis not present

## 2023-09-08 DIAGNOSIS — F419 Anxiety disorder, unspecified: Secondary | ICD-10-CM | POA: Diagnosis not present

## 2023-09-08 DIAGNOSIS — Z6823 Body mass index (BMI) 23.0-23.9, adult: Secondary | ICD-10-CM | POA: Diagnosis not present

## 2023-09-08 DIAGNOSIS — M818 Other osteoporosis without current pathological fracture: Secondary | ICD-10-CM | POA: Diagnosis not present

## 2023-09-08 DIAGNOSIS — R7303 Prediabetes: Secondary | ICD-10-CM | POA: Diagnosis not present

## 2023-09-08 DIAGNOSIS — E039 Hypothyroidism, unspecified: Secondary | ICD-10-CM | POA: Diagnosis not present

## 2023-09-08 DIAGNOSIS — I10 Essential (primary) hypertension: Secondary | ICD-10-CM | POA: Diagnosis not present

## 2023-09-08 DIAGNOSIS — E2839 Other primary ovarian failure: Secondary | ICD-10-CM | POA: Diagnosis not present

## 2023-09-08 DIAGNOSIS — E559 Vitamin D deficiency, unspecified: Secondary | ICD-10-CM | POA: Diagnosis not present

## 2023-09-08 DIAGNOSIS — E7800 Pure hypercholesterolemia, unspecified: Secondary | ICD-10-CM | POA: Diagnosis not present

## 2023-09-08 DIAGNOSIS — N1832 Chronic kidney disease, stage 3b: Secondary | ICD-10-CM | POA: Diagnosis not present

## 2023-09-16 DIAGNOSIS — N1832 Chronic kidney disease, stage 3b: Secondary | ICD-10-CM | POA: Diagnosis not present

## 2023-09-22 DIAGNOSIS — N1832 Chronic kidney disease, stage 3b: Secondary | ICD-10-CM | POA: Diagnosis not present

## 2023-09-22 DIAGNOSIS — I4891 Unspecified atrial fibrillation: Secondary | ICD-10-CM | POA: Diagnosis not present

## 2023-09-22 DIAGNOSIS — E7800 Pure hypercholesterolemia, unspecified: Secondary | ICD-10-CM | POA: Diagnosis not present

## 2023-09-22 DIAGNOSIS — R0602 Shortness of breath: Secondary | ICD-10-CM | POA: Diagnosis not present

## 2023-09-22 DIAGNOSIS — F32A Depression, unspecified: Secondary | ICD-10-CM | POA: Diagnosis not present

## 2023-09-22 DIAGNOSIS — E2839 Other primary ovarian failure: Secondary | ICD-10-CM | POA: Diagnosis not present

## 2023-09-22 DIAGNOSIS — Z6823 Body mass index (BMI) 23.0-23.9, adult: Secondary | ICD-10-CM | POA: Diagnosis not present

## 2023-09-22 DIAGNOSIS — I10 Essential (primary) hypertension: Secondary | ICD-10-CM | POA: Diagnosis not present

## 2023-09-22 DIAGNOSIS — F419 Anxiety disorder, unspecified: Secondary | ICD-10-CM | POA: Diagnosis not present

## 2023-09-22 DIAGNOSIS — M818 Other osteoporosis without current pathological fracture: Secondary | ICD-10-CM | POA: Diagnosis not present

## 2023-09-22 DIAGNOSIS — R7303 Prediabetes: Secondary | ICD-10-CM | POA: Diagnosis not present

## 2023-09-22 DIAGNOSIS — E039 Hypothyroidism, unspecified: Secondary | ICD-10-CM | POA: Diagnosis not present

## 2023-09-29 DIAGNOSIS — I4891 Unspecified atrial fibrillation: Secondary | ICD-10-CM | POA: Diagnosis not present

## 2023-09-29 DIAGNOSIS — R0789 Other chest pain: Secondary | ICD-10-CM | POA: Diagnosis not present

## 2023-09-29 DIAGNOSIS — F419 Anxiety disorder, unspecified: Secondary | ICD-10-CM | POA: Diagnosis not present

## 2023-09-29 DIAGNOSIS — N1832 Chronic kidney disease, stage 3b: Secondary | ICD-10-CM | POA: Diagnosis not present

## 2023-09-29 DIAGNOSIS — I10 Essential (primary) hypertension: Secondary | ICD-10-CM | POA: Diagnosis not present

## 2023-09-29 DIAGNOSIS — R0602 Shortness of breath: Secondary | ICD-10-CM | POA: Diagnosis not present

## 2023-09-29 DIAGNOSIS — E7800 Pure hypercholesterolemia, unspecified: Secondary | ICD-10-CM | POA: Diagnosis not present

## 2023-09-29 DIAGNOSIS — R269 Unspecified abnormalities of gait and mobility: Secondary | ICD-10-CM | POA: Diagnosis not present

## 2023-09-29 DIAGNOSIS — R7303 Prediabetes: Secondary | ICD-10-CM | POA: Diagnosis not present

## 2023-09-29 DIAGNOSIS — I110 Hypertensive heart disease with heart failure: Secondary | ICD-10-CM | POA: Diagnosis not present

## 2023-09-29 DIAGNOSIS — R058 Other specified cough: Secondary | ICD-10-CM | POA: Diagnosis not present

## 2023-09-29 DIAGNOSIS — Z950 Presence of cardiac pacemaker: Secondary | ICD-10-CM | POA: Diagnosis not present

## 2023-09-29 DIAGNOSIS — R011 Cardiac murmur, unspecified: Secondary | ICD-10-CM | POA: Diagnosis not present

## 2023-09-29 DIAGNOSIS — Z7901 Long term (current) use of anticoagulants: Secondary | ICD-10-CM | POA: Diagnosis not present

## 2023-09-29 DIAGNOSIS — E039 Hypothyroidism, unspecified: Secondary | ICD-10-CM | POA: Diagnosis not present

## 2023-09-29 DIAGNOSIS — Z6823 Body mass index (BMI) 23.0-23.9, adult: Secondary | ICD-10-CM | POA: Diagnosis not present

## 2023-09-29 DIAGNOSIS — Z91148 Patient's other noncompliance with medication regimen for other reason: Secondary | ICD-10-CM | POA: Diagnosis not present

## 2023-09-29 DIAGNOSIS — J90 Pleural effusion, not elsewhere classified: Secondary | ICD-10-CM | POA: Diagnosis not present

## 2023-09-29 DIAGNOSIS — I509 Heart failure, unspecified: Secondary | ICD-10-CM | POA: Diagnosis not present

## 2023-09-29 DIAGNOSIS — M818 Other osteoporosis without current pathological fracture: Secondary | ICD-10-CM | POA: Diagnosis not present

## 2023-09-29 DIAGNOSIS — F32A Depression, unspecified: Secondary | ICD-10-CM | POA: Diagnosis not present

## 2023-09-29 DIAGNOSIS — I517 Cardiomegaly: Secondary | ICD-10-CM | POA: Diagnosis not present

## 2023-10-20 DIAGNOSIS — R4589 Other symptoms and signs involving emotional state: Secondary | ICD-10-CM | POA: Diagnosis not present

## 2023-10-20 DIAGNOSIS — R7303 Prediabetes: Secondary | ICD-10-CM | POA: Diagnosis not present

## 2023-10-20 DIAGNOSIS — Z6823 Body mass index (BMI) 23.0-23.9, adult: Secondary | ICD-10-CM | POA: Diagnosis not present

## 2023-10-20 DIAGNOSIS — I509 Heart failure, unspecified: Secondary | ICD-10-CM | POA: Diagnosis not present

## 2023-10-20 DIAGNOSIS — I10 Essential (primary) hypertension: Secondary | ICD-10-CM | POA: Diagnosis not present

## 2023-10-20 DIAGNOSIS — E7800 Pure hypercholesterolemia, unspecified: Secondary | ICD-10-CM | POA: Diagnosis not present

## 2023-10-20 DIAGNOSIS — L659 Nonscarring hair loss, unspecified: Secondary | ICD-10-CM | POA: Diagnosis not present

## 2023-10-20 DIAGNOSIS — N1832 Chronic kidney disease, stage 3b: Secondary | ICD-10-CM | POA: Diagnosis not present

## 2023-10-20 DIAGNOSIS — M818 Other osteoporosis without current pathological fracture: Secondary | ICD-10-CM | POA: Diagnosis not present

## 2023-10-20 DIAGNOSIS — E039 Hypothyroidism, unspecified: Secondary | ICD-10-CM | POA: Diagnosis not present

## 2023-11-17 DIAGNOSIS — R7303 Prediabetes: Secondary | ICD-10-CM | POA: Diagnosis not present

## 2023-11-17 DIAGNOSIS — I10 Essential (primary) hypertension: Secondary | ICD-10-CM | POA: Diagnosis not present

## 2023-11-17 DIAGNOSIS — E039 Hypothyroidism, unspecified: Secondary | ICD-10-CM | POA: Diagnosis not present

## 2023-11-17 DIAGNOSIS — I509 Heart failure, unspecified: Secondary | ICD-10-CM | POA: Diagnosis not present

## 2023-11-17 DIAGNOSIS — J45909 Unspecified asthma, uncomplicated: Secondary | ICD-10-CM | POA: Diagnosis not present

## 2023-11-17 DIAGNOSIS — N1832 Chronic kidney disease, stage 3b: Secondary | ICD-10-CM | POA: Diagnosis not present

## 2023-11-17 DIAGNOSIS — M818 Other osteoporosis without current pathological fracture: Secondary | ICD-10-CM | POA: Diagnosis not present

## 2023-11-17 DIAGNOSIS — L659 Nonscarring hair loss, unspecified: Secondary | ICD-10-CM | POA: Diagnosis not present

## 2023-11-17 DIAGNOSIS — E7800 Pure hypercholesterolemia, unspecified: Secondary | ICD-10-CM | POA: Diagnosis not present

## 2023-11-17 DIAGNOSIS — R4589 Other symptoms and signs involving emotional state: Secondary | ICD-10-CM | POA: Diagnosis not present

## 2023-11-17 DIAGNOSIS — Z6823 Body mass index (BMI) 23.0-23.9, adult: Secondary | ICD-10-CM | POA: Diagnosis not present

## 2024-05-12 ENCOUNTER — Ambulatory Visit
Admission: EM | Admit: 2024-05-12 | Discharge: 2024-05-12 | Disposition: A | Discharge status: Home / Self Care | Attending: Family Medicine | Admitting: Family Medicine

## 2024-05-12 ENCOUNTER — Encounter: Payer: Self-pay | Admitting: *Deleted

## 2024-05-12 DIAGNOSIS — B0229 Other postherpetic nervous system involvement: Secondary | ICD-10-CM | POA: Diagnosis not present

## 2024-05-12 MED ORDER — OXYCODONE HCL 5 MG PO TABS: 5.0000 mg | ORAL_TABLET | Freq: Four times a day (QID) | ORAL | 0 refills | Status: AC | PRN

## 2024-05-12 MED ORDER — GABAPENTIN 300 MG PO CAPS: 300.0000 mg | ORAL_CAPSULE | Freq: Three times a day (TID) | ORAL | 0 refills | Status: AC

## 2024-05-12 NOTE — ED Triage Notes (Signed)
 Patient/son states diagnosed with Shingles right hand over a month ago and symptoms better but not resolved.  Here for pain in right hand, recently prescribed hydrocodone but son concerned because she has stomach ulcers he doesn't want to give it to her.  Tylenol  with codeine and gabapentin haven't been effective for pain

## 2024-05-12 NOTE — ED Provider Notes (Signed)
 " Rachel [facility] C[facility]    CSN: [id number] Arrival date & time: 05/12/24  9077      History   Chief Complaint Chief Complaint  Patient presents with   Herpes Zoster    HPI Rachel Keith is a 84 y.o. female.   HPI  Patient had levels about a month ago.  She is here for unremitting shingles pain.  She had seen her primary care doctor and was given hydrocodone with Tylenol .  Son, who lives with and cares for his mother, looked it up and decided that it was not an appropriate medicine to give given her recent GI bleeding.  He therefore only gave her 1 pill.  It did not help with the pain.  Tylenol  with codeine has not helped with the pain.  Gabapentin has not helped with the pain.  She is only taking 100 mg twice daily.  She is not sleeping at night.  She is tearful.  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   Hormone replacement therapy (HRT) 12/18/2016   Mitral insufficiency    Acute exacerbation of CHF (congestive heart failure) (HCC) 09/13/2015   Chronic atrial fibrillation (HCC) 09/13/2015   Anemia 09/13/2015   History of kidney stones 09/05/2015   Hematuria 07/10/2015   Anticoagulant long-term use 07/09/2015   CKD (chronic kidney disease) stage 3, GFR 30-59 ml/min (HCC) 05/29/2015   Tricuspid valve regurgitation    Mitral valve regurgitation    Pulmonary hypertension (HCC)    Atrial fibrillation with RVR (HCC) 05/28/2015   Hypothyroidism 05/28/2015   Bradycardia 01/22/2011   Murmur 01/22/2011   Anxiety  11/21/2010   Ocular migraine 10/10/2010   Post-menopausal 10/10/2010   Hyperlipidemia 10/10/2010   Hair loss 09/17/2010   Essential hypertension, benign 09/17/2010    Past Surgical History:  Procedure Laterality Date   ABDOMINAL HYSTERECTOMY     ANKLE FRACTURE SURGERY Left ~ 2006   APPENDECTOMY     CARDIOVERSION  2017   thelbert 07/26/2015   CATARACT EXTRACTION W/ INTRAOCULAR LENS  IMPLANT, BILATERAL Bilateral 09/06/2010   CYSTOSCOPY W/ STONE MANIPULATION  06/2015 X 2   FINGER SURGERY Right    had bone growing out on the side of little finger   FRACTURE SURGERY     LAPAROSCOPIC CHOLECYSTECTOMY     RIGHT/LEFT HEART CATH AND CORONARY ANGIOGRAPHY N/A 03/23/2019   Procedure: RIGHT/LEFT HEART CATH AND CORONARY ANGIOGRAPHY;  Surgeon: Verlin Lonni BIRCH, MD;  Location: [facility] INVASIVE CV LAB;  Service: Cardiovascular;  Laterality: N/A;   TEE WITHOUT CARDIOVERSION N/A 10/04/2015   Procedure: TRANSESOPHAGEAL ECHOCARDIOGRAM (TEE);  Surgeon: Jerel Balding, MD;  Location: [facility] ENDOSCOPY;  Service: Cardiovascular;  Laterality: N/A;   TEE WITHOUT CARDIOVERSION N/A 03/23/2019   Procedure: TRANSESOPHAGEAL ECHOCARDIOGRAM (TEE);  Surgeon: Jeffrie Oneil BROCKS, MD;  Location: [facility] ENDOSCOPY;  Service: Cardiovascular;  Laterality: N/A;   TONSILLECTOMY      OB History   No obstetric  history on file.      Home Medications    Prior to Admission medications  Medication Sig Start Date End Date Taking? Authorizing Provider  gabapentin (NEURONTIN) 300 MG capsule Take 1 capsule (300 mg total) by mouth 3 (three) times daily. 05/12/24  Yes Maranda Jamee Jacob, MD  oxyCODONE (ROXICODONE) 5 MG immediate release tablet Take 1 tablet (5 mg total) by mouth every 6 (six) hours as needed for severe pain (pain score 7-10). 05/12/24  Yes Maranda Jamee Jacob, MD  apixaban  (ELIQUIS ) 5 MG TABS tablet TAKE 1 TABLET (5 MG TOTAL) BY MOUTH 2 (TWO) TIMES DAILY. 05/21/20   Alvan Dorothyann BIRCH, MD  atenolol  (TENORMIN ) 50 MG tablet TAKE 1  TABLET BY MOUTH TWICE A DAY 06/18/21   Metheney, Catherine D, MD  B Complex Vitamins (B COMPLEX PO) Take 1 tablet by mouth daily.    [provider]  Biotin 5 MG TABS Take 1 tablet by mouth daily.    [provider]  Black Pepper-Turmeric (TURMERIC COMPLEX/BLACK PEPPER PO) Take 2,000 mg by mouth 2 (two) times daily.    [provider]  butalbital -aspirin -caffeine  (FIORINAL ) 50-325-40 MG capsule TAKE ONE CAP BY MOUTH EVERY 4 HOURS AS NEEDED FOR OCCULAR MIGRAINE 09/20/20   Alvan Dorothyann BIRCH, MD  diazepam  (VALIUM ) 5 MG tablet Take 0.5-1 tablets (2.5-5 mg total) by mouth daily as needed. for anxiety Patient not taking: Reported on 09/20/2020 09/03/20   Alvan Dorothyann BIRCH, MD  furosemide  (LASIX ) 20 MG tablet Take 1 tablet (20 mg total) by mouth daily. 09/20/20   Alvan Dorothyann BIRCH, MD  furosemide  (LASIX ) 40 MG tablet TAKE 1 TABLET (40 MG TOTAL) BY MOUTH DAILY AS NEEDED. KEEP UPCOMING APPT FOR FUTURE REFILLS ./CY 06/18/21   Alvan Dorothyann BIRCH, MD  levothyroxine  (SYNTHROID ) 112 MCG tablet TAKE 1 TABLET BY MOUTH EVERY DAY BEFORE BREAKFAST 03/12/21   Alvan Dorothyann BIRCH, MD  metoprolol  succinate (TOPROL -XL) 50 MG 24 hr tablet TAKE 1 TABLET BY MOUTH EVERY DAY 06/18/21   Alvan Dorothyann BIRCH, MD  Omega-3 Fatty Acids (FISH OIL) 1000 MG CAPS Take 1 capsule by mouth daily.    [provider]  Selenium 200 MCG TABS Take 1 tablet by mouth daily.    [provider]  tretinoin  (RETIN-A ) 0.025 % cream Apply topically at bedtime as needed. 11/25/17   Alvan Dorothyann BIRCH, MD    Family History Family History  Problem Relation Age of Onset   Alcohol abuse Mother    Liver disease Mother    Alcohol abuse Father     Social History Social History[1]   Allergies   Amlodipine , Lisinopril, and Sulfa  antibiotics   Review of Systems Review of Systems  See HPI Physical Exam Triage Vital Signs ED Triage Vitals  Encounter Vitals Group     BP 05/12/24 0943  132/69     Girls Systolic BP Percentile --      Girls Diastolic BP Percentile --      Boys Systolic BP Percentile --      Boys Diastolic BP Percentile --      Pulse Rate 05/12/24 0943 (!) 106     Resp 05/12/24 0943 20     Temp 05/12/24 0943 97.8 F (36.6 C)     Temp Source 05/12/24 0943 Oral     SpO2 05/12/24 0943 96 %     Weight 05/12/24 0936 123 lb 9.6 oz (56.1 kg)     Height --      Head Circumference --  Peak Flow --      Pain Score 05/12/24 0939 10     Pain Loc --      Pain Education --      Exclude from Growth Chart --    No data found.  Updated Vital Signs BP 132/69 (BP Location: Left Arm)   Pulse (!) 106   Temp 97.8 F (36.6 C) (Oral)   Resp 20   Wt 56.1 kg   SpO2 96%   BMI 22.61 kg/m      Physical Exam Constitutional:      General: She is in acute distress.     Appearance: She is well-developed.     Comments: Patient is tired.  Tearful.  Obviously at her wits end  HENT:     Head: Normocephalic and atraumatic.  Eyes:     Conjunctiva/sclera: Conjunctivae normal.     Pupils: Pupils are equal, round, and reactive to light.  Cardiovascular:     Rate and Rhythm: Normal rate.  Pulmonary:     Effort: Pulmonary effort is normal. No respiratory distress.  Musculoskeletal:        General: Normal range of motion.     Cervical back: Normal range of motion.  Skin:    General: Skin is warm and dry.     Findings: Lesion and rash present.     Comments: Healing circles and ovals consistent with herpetic infection down right arm into hand  Neurological:     Mental Status: She is alert.      UC Treatments / Results  Labs (all labs ordered are listed, but only abnormal results are displayed) Labs Reviewed - No data to display  EKG   Radiology No results found.  Procedures Procedures (including critical care time)  Medications Ordered in UC Medications - No data to display  Initial Impression / Assessment and Plan / UC Course  I have reviewed the  triage vital signs and the nursing notes.  Pertinent labs & imaging results that were available during my care of the patient were reviewed by me and considered in my medical decision making (see chart for details).     I explained that they can push the gabapentin.  100 mg once or twice a day is not enough.  Cautioned drowsiness.  Recommend supervision to prevent falls.  I will give her a limited number of oxycodone for pain.  Again can cause sedation.  Follow-up with PCP Final Clinical Impressions(s) / UC Diagnoses   Final diagnoses:  Postherpetic neuralgia     Discharge Instructions      Start with gabapentin 300 mg at night.  During the day you can give 100 mg a couple times a day.  You may increase the gabapentin as tolerated.  Watch for drowsiness.  She may take 300 mg 3 times a day.  Increase the dose slowly. May try oxycodone as needed for severe pain.  Caution drowsiness. Try to take medications with some food. May continue with pain relief cream Call your doctor Monday with an update.  Your primary doctor will provide refills of any medicine needed   ED Prescriptions     Medication Sig Dispense Auth. Provider   gabapentin (NEURONTIN) 300 MG capsule Take 1 capsule (300 mg total) by mouth 3 (three) times daily. 50 capsule Maranda Jamee Jacob, MD   oxyCODONE (ROXICODONE) 5 MG immediate release tablet Take 1 tablet (5 mg total) by mouth every 6 (six) hours as needed for severe pain (pain score 7-10).  30 tablet Maranda Jamee Jacob, MD      I have reviewed the PDMP during this encounter.    [1]  Social History Tobacco Use   Smoking status: Never   Smokeless tobacco: Never  Vaping Use   Vaping status: Never Used  Substance Use Topics   Alcohol use: Not Currently    Comment: 09/13/2015 might have 1 drink/year   Drug use: No     Maranda Jamee Jacob, MD 05/12/24 1907  "

## 2024-05-12 NOTE — Discharge Instructions (Signed)
 Start with gabapentin 300 mg at night.  During the day you can give 100 mg a couple times a day.  You may increase the gabapentin as tolerated.  Watch for drowsiness.  She may take 300 mg 3 times a day.  Increase the dose slowly. May try oxycodone as needed for severe pain.  Caution drowsiness. Try to take medications with some food. May continue with pain relief cream Call your doctor Monday with an update.  Your primary doctor will provide refills of any medicine needed

## 2024-05-19 ENCOUNTER — Ambulatory Visit: Payer: Self-pay

## 2024-05-19 NOTE — Telephone Encounter (Signed)
" ° ° ° °  Copied from CRM #[id number]. Topic: Clinical - Red Word Triage >> May 19, 2024  8:45 AM Alfonso ORN wrote: Red Word that prompted transfer to Nurse Triage: dealing  shingle in a lot pain and right hand and right finger , can not open hand or use it  (Patient looking for a new primary care provider in [place] stated her current provider can not provide the help she needs Reason for Disposition  SEVERE pain (e.g., excruciating)  Answer Assessment - Initial Assessment Questions 1. APPEARANCE of RASH: What does the rash look like?      Shingles, Dx UC 2. LOCATION: Where is the rash located?      Rt hand 3. ONSET: When did the rash start?      Week ago 4. ITCHING: Does the rash itch? If Yes, ask: How bad is the itch?  (Scale 1-10; or mild, moderate, severe)      5. PAIN: Does the rash hurt? If Yes, ask: How bad is the pain?  (Scale 0-10; or none, mild, moderate, severe)     Painful to touch  Protocols used: Shingles (Zoster)-A-AH  "

## 2024-06-10 NOTE — Progress Notes (Signed)
 Office Visit Note  Assessment and Plan   Assessment & Plan Urolithiasis. Stent removed today in clinic, see separate procedure note As a significant, separately identifiable issue, we also addressed the patient's diagnosis of kidney stone disease Renal ultrasound in 2-3 months to follow-up kidney stones and ensure no hydronephrosis present Stone prevention reviewed with patient, see counseling Follow-up in 6 months to review   PROCEDURE Cystoscopy, left ureteroscopy with laser lithotripsy, and left ureteral stent exchange were performed on 05/09/2024. The stent was removed today in the clinic.  Diagnoses and all orders for this visit:  Retained ureteral stent -     Urinalysis; Future -     lidocaine  HCl (XYLOCAINE ) 2% uro-jet gel -     cephALEXin  (KEFLEX ) capsule 500 mg -     Urinalysis  Ureteral calculus, left -     CYSTOSCOPY w/STENT REMOVAL    Orders Placed This Encounter  Procedures   Urinalysis    Patient's Medications  New Prescriptions   No medications on file    Risks, benefits, and alternatives of the medications and treatment plan prescribed today were discussed. Patient and or family expresses understanding and all questions and concerns were answered. The patient is in agreement with the plan as stated above.  History   History of Present Illness The patient is an 84 year old female who presents for a follow-up of urolithiasis. She underwent cystoscopy, left ureteroscopy with laser lithotripsy, and left ureteral stent exchange on 05/09/2024 for a left proximal ureteral stone. The stent was removed today in the clinic. Her past medical history is significant for atrial fibrillation, hypertension, and stage 3b chronic kidney disease (CKD). She takes Eliquis  5 mg twice a day.  Objective   Physical Exam   Labs/Radiology   Labs: Procedure visit on 06/10/2024  Component Date Value Ref Range Status   Urine Color 06/10/2024 Amber   Final   Urine Clarity  06/10/2024 Cloudy   Final   Urine Glucose 06/10/2024 Negative  Negative mg/dL Final   Urine Bilirubin 06/10/2024 Negative  Negative mg/dL Final   Urine Ketones 98/69/7973 Negative  Negative mg/dl Final   Urine Specific Gravity 06/10/2024 1.020  1.005 - 1.030 Final   Urine pH 06/10/2024 7.0  5.0 - 9.0 Final   Urine Protein - Dipstick 06/10/2024 100 (A)  Negative mg/dl Final   Urine Urobilinogen 06/10/2024 1.0  0.2 , 1.0 mg/dl Final   Urine Nitrite 98/69/7973 Negative  Negative Final   Urine Leukocyte Esterase 06/10/2024 Small (A)  Negative Leu/mcL Final   Urine Blood 06/10/2024 Large (A)  Negative mg/dL Final      Chemistry      Component Value Date/Time   NA 146 04/28/2024 1605   NA 136 09/26/2014 1815   K 3.0 (L) 04/28/2024 1605   K 3.8 09/26/2014 1815   CL 105 04/28/2024 1605   CL 97 09/26/2014 1815   CO2 29 04/28/2024 1605   CO2 25 09/26/2014 1815   BUN 12 04/28/2024 1605   BUN 7 (L) 09/26/2014 1815   CREATININE 1.28 (H) 04/28/2024 1605   CREATININE 0.93 09/26/2014 1815   GLUCOSE 111 (H) 04/28/2024 1605   GLUCOSE 118 (H) 09/26/2014 1815   GLUCOSE 114 (H) 09/26/2014 1746      Component Value Date/Time   CALCIUM 9.5 04/28/2024 1605   CALCIUM 10 06/22/2015 0845   ALKPHOS 96 04/28/2024 1605   ALKPHOS 81 09/26/2014 1815   AST 20 04/28/2024 1605   AST 12 09/26/2014 1815   ALT  12 04/28/2024 1605   ALT 8 09/26/2014 1815   BILITOT 0.4 04/28/2024 1605   BILITOT 0.87 07/09/2021 1221   BILITOT 0.60 09/26/2014 1815      Lab Results  Component Value Date   WBC 6.6 04/28/2024   HGB 11.1 (L) 04/28/2024   HCT 34.2 04/28/2024   MCV 94.0 04/28/2024   Plt Ct 164 04/28/2024    No results found for: TESTOSTERONE No results found for: PSA  I have independently reviewed the above laboratory results and communicated them to the patient as appropriate.   Results   No results found.  I have independently viewed the above imaging and agree with the impression as  stated. Imaging reviewed and shared with patient.  Computer technology was used to create visit note.  Consent from the patient/caregiver was obtained prior to use.

## 2024-06-15 ENCOUNTER — Ambulatory Visit: Admitting: Physician Assistant

## 2024-06-15 DIAGNOSIS — B0229 Other postherpetic nervous system involvement: Secondary | ICD-10-CM | POA: Insufficient documentation

## 2024-06-15 DIAGNOSIS — K279 Peptic ulcer, site unspecified, unspecified as acute or chronic, without hemorrhage or perforation: Secondary | ICD-10-CM | POA: Insufficient documentation

## 2024-06-15 NOTE — Assessment & Plan Note (Signed)
 S/p cystoscopy with stent removal 05/31/2024 with [facility], Dr. McClain.

## 2024-06-15 NOTE — Assessment & Plan Note (Addendum)
-   Seen last month at an urgent care for her pain symptoms. - Gabapentin  300 mg at bedtime and 100 mg twice daily. - Oxycodone  5 mg was provided as needed for breakthrough pain. - Cautioned again on the sedative effects and risk for falls.

## 2024-06-15 NOTE — Assessment & Plan Note (Signed)
-   Recent admission for melena and hematemesis. - EGD obtained 04/13/2024 revealed multiple healing, non bleeding ulcers. - Recommendation was for twice daily PPI and to abstain from any NSAIDs, particularly given anticoagulation for atrial fibrillation. - Last Hgb 04/28/2024 was 11.1 g/dL.

## 2024-06-15 NOTE — Assessment & Plan Note (Signed)
-   Currently on Eliquis  5 mg BID  - Toprol  XL 50 mg
